# Patient Record
Sex: Female | Born: 1945 | Race: Black or African American | Hispanic: No | Marital: Married | Smoking: Never smoker
Health system: Southern US, Community
[De-identification: ages and names within clinical notes are randomized; demographics above are authoritative.]

## PROBLEM LIST (undated history)

## (undated) DIAGNOSIS — R945 Abnormal results of liver function studies: Secondary | ICD-10-CM

## (undated) DIAGNOSIS — E669 Obesity, unspecified: Secondary | ICD-10-CM

## (undated) DIAGNOSIS — Z780 Asymptomatic menopausal state: Secondary | ICD-10-CM

## (undated) DIAGNOSIS — N390 Urinary tract infection, site not specified: Secondary | ICD-10-CM

## (undated) DIAGNOSIS — R809 Proteinuria, unspecified: Secondary | ICD-10-CM

## (undated) DIAGNOSIS — I4892 Unspecified atrial flutter: Secondary | ICD-10-CM

## (undated) DIAGNOSIS — I10 Essential (primary) hypertension: Secondary | ICD-10-CM

## (undated) DIAGNOSIS — R7989 Other specified abnormal findings of blood chemistry: Secondary | ICD-10-CM

## (undated) HISTORY — DX: Obesity, unspecified: E66.9

## (undated) HISTORY — DX: Essential (primary) hypertension: I10

## (undated) HISTORY — DX: Asymptomatic menopausal state: Z78.0

## (undated) HISTORY — DX: Urinary tract infection, site not specified: N39.0

## (undated) HISTORY — DX: Abnormal results of liver function studies: R94.5

## (undated) HISTORY — PX: TUBAL LIGATION: SHX77

## (undated) HISTORY — DX: Proteinuria, unspecified: R80.9

## (undated) HISTORY — DX: Other specified abnormal findings of blood chemistry: R79.89

## (undated) HISTORY — DX: Unspecified atrial flutter: I48.92

---
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
## 1998-12-15 HISTORY — PX: CHOLECYSTECTOMY: SHX55

## 1999-06-05 ENCOUNTER — Encounter: Admission: RE | Admit: 1999-06-05 | Discharge: 1999-09-03 | Payer: Self-pay | Admitting: Family Medicine

## 1999-07-16 ENCOUNTER — Emergency Department (HOSPITAL_COMMUNITY): Admission: EM | Admit: 1999-07-16 | Discharge: 1999-07-16 | Payer: Self-pay | Admitting: *Deleted

## 1999-09-27 ENCOUNTER — Emergency Department (HOSPITAL_COMMUNITY): Admission: EM | Admit: 1999-09-27 | Discharge: 1999-09-27 | Payer: Self-pay | Admitting: Emergency Medicine

## 2000-03-26 ENCOUNTER — Encounter: Admission: RE | Admit: 2000-03-26 | Discharge: 2000-06-24 | Payer: Self-pay | Admitting: Family Medicine

## 2001-05-04 ENCOUNTER — Encounter: Payer: Self-pay | Admitting: Family Medicine

## 2001-05-04 ENCOUNTER — Encounter: Admission: RE | Admit: 2001-05-04 | Discharge: 2001-05-04 | Payer: Self-pay | Admitting: Family Medicine

## 2001-06-22 ENCOUNTER — Encounter: Payer: Self-pay | Admitting: Emergency Medicine

## 2001-06-23 ENCOUNTER — Encounter: Payer: Self-pay | Admitting: Family Medicine

## 2001-06-23 ENCOUNTER — Observation Stay (HOSPITAL_COMMUNITY): Admission: EM | Admit: 2001-06-23 | Discharge: 2001-06-23 | Payer: Self-pay | Admitting: Emergency Medicine

## 2001-06-27 ENCOUNTER — Emergency Department (HOSPITAL_COMMUNITY): Admission: EM | Admit: 2001-06-27 | Discharge: 2001-06-27 | Payer: Self-pay | Admitting: Emergency Medicine

## 2001-07-11 ENCOUNTER — Emergency Department (HOSPITAL_COMMUNITY): Admission: EM | Admit: 2001-07-11 | Discharge: 2001-07-12 | Payer: Self-pay | Admitting: Emergency Medicine

## 2001-08-09 ENCOUNTER — Emergency Department (HOSPITAL_COMMUNITY): Admission: EM | Admit: 2001-08-09 | Discharge: 2001-08-09 | Payer: Self-pay | Admitting: Emergency Medicine

## 2001-08-11 ENCOUNTER — Encounter: Payer: Self-pay | Admitting: General Surgery

## 2001-08-12 ENCOUNTER — Observation Stay (HOSPITAL_COMMUNITY): Admission: RE | Admit: 2001-08-12 | Discharge: 2001-08-13 | Payer: Self-pay | Admitting: General Surgery

## 2001-08-12 ENCOUNTER — Encounter (INDEPENDENT_AMBULATORY_CARE_PROVIDER_SITE_OTHER): Payer: Self-pay | Admitting: Specialist

## 2001-08-12 ENCOUNTER — Encounter: Payer: Self-pay | Admitting: General Surgery

## 2002-06-24 ENCOUNTER — Encounter: Payer: Self-pay | Admitting: Emergency Medicine

## 2002-06-24 ENCOUNTER — Emergency Department (HOSPITAL_COMMUNITY): Admission: EM | Admit: 2002-06-24 | Discharge: 2002-06-24 | Payer: Self-pay | Admitting: Emergency Medicine

## 2002-06-27 ENCOUNTER — Ambulatory Visit (HOSPITAL_COMMUNITY): Admission: RE | Admit: 2002-06-27 | Discharge: 2002-06-27 | Payer: Self-pay | Admitting: Gastroenterology

## 2002-10-26 ENCOUNTER — Other Ambulatory Visit: Admission: RE | Admit: 2002-10-26 | Discharge: 2002-10-26 | Payer: Self-pay | Admitting: Family Medicine

## 2002-11-15 ENCOUNTER — Encounter: Admission: RE | Admit: 2002-11-15 | Discharge: 2003-02-13 | Payer: Self-pay | Admitting: Family Medicine

## 2002-11-16 ENCOUNTER — Encounter: Payer: Self-pay | Admitting: Emergency Medicine

## 2002-11-16 ENCOUNTER — Emergency Department (HOSPITAL_COMMUNITY): Admission: EM | Admit: 2002-11-16 | Discharge: 2002-11-16 | Payer: Self-pay | Admitting: Emergency Medicine

## 2002-11-30 ENCOUNTER — Emergency Department (HOSPITAL_COMMUNITY): Admission: EM | Admit: 2002-11-30 | Discharge: 2002-11-30 | Payer: Self-pay | Admitting: Emergency Medicine

## 2002-11-30 ENCOUNTER — Encounter: Payer: Self-pay | Admitting: Emergency Medicine

## 2003-03-17 ENCOUNTER — Encounter: Admission: RE | Admit: 2003-03-17 | Discharge: 2003-06-15 | Payer: Self-pay | Admitting: Family Medicine

## 2003-10-23 ENCOUNTER — Emergency Department (HOSPITAL_COMMUNITY): Admission: EM | Admit: 2003-10-23 | Discharge: 2003-10-23 | Payer: Self-pay | Admitting: Emergency Medicine

## 2004-04-01 ENCOUNTER — Emergency Department (HOSPITAL_COMMUNITY): Admission: EM | Admit: 2004-04-01 | Discharge: 2004-04-01 | Payer: Self-pay | Admitting: Emergency Medicine

## 2005-01-30 ENCOUNTER — Emergency Department (HOSPITAL_COMMUNITY): Admission: EM | Admit: 2005-01-30 | Discharge: 2005-01-30 | Payer: Self-pay | Admitting: Emergency Medicine

## 2005-06-13 ENCOUNTER — Emergency Department (HOSPITAL_COMMUNITY): Admission: EM | Admit: 2005-06-13 | Discharge: 2005-06-14 | Payer: Self-pay | Admitting: Emergency Medicine

## 2006-04-04 ENCOUNTER — Emergency Department (HOSPITAL_COMMUNITY): Admission: EM | Admit: 2006-04-04 | Discharge: 2006-04-04 | Payer: Self-pay | Admitting: Emergency Medicine

## 2006-04-28 ENCOUNTER — Emergency Department (HOSPITAL_COMMUNITY): Admission: EM | Admit: 2006-04-28 | Discharge: 2006-04-28 | Payer: Self-pay | Admitting: *Deleted

## 2006-08-28 ENCOUNTER — Emergency Department (HOSPITAL_COMMUNITY): Admission: EM | Admit: 2006-08-28 | Discharge: 2006-08-29 | Payer: Self-pay | Admitting: Emergency Medicine

## 2006-10-29 ENCOUNTER — Emergency Department (HOSPITAL_COMMUNITY): Admission: EM | Admit: 2006-10-29 | Discharge: 2006-10-29 | Payer: Self-pay | Admitting: Emergency Medicine

## 2008-09-02 ENCOUNTER — Emergency Department (HOSPITAL_COMMUNITY): Admission: EM | Admit: 2008-09-02 | Discharge: 2008-09-02 | Payer: Self-pay | Admitting: Emergency Medicine

## 2009-12-13 ENCOUNTER — Emergency Department (HOSPITAL_COMMUNITY): Admission: EM | Admit: 2009-12-13 | Discharge: 2009-12-13 | Payer: Self-pay | Admitting: Emergency Medicine

## 2011-03-17 LAB — POCT I-STAT, CHEM 8
BUN: 6 mg/dL (ref 6–23)
Calcium, Ion: 1.15 mmol/L (ref 1.12–1.32)
Chloride: 103 mEq/L (ref 96–112)
Creatinine, Ser: 0.7 mg/dL (ref 0.4–1.2)
Glucose, Bld: 193 mg/dL — ABNORMAL HIGH (ref 70–99)
HCT: 43 % (ref 36.0–46.0)
Hemoglobin: 14.6 g/dL (ref 12.0–15.0)
Potassium: 3.6 mEq/L (ref 3.5–5.1)
Sodium: 138 mEq/L (ref 135–145)
TCO2: 25 mmol/L (ref 0–100)

## 2011-05-01 ENCOUNTER — Encounter: Payer: Self-pay | Admitting: Oncology

## 2011-05-02 NOTE — Procedures (Signed)
[facility] [place]  Patient:    Frances Cabrera, Frances Cabrera Visit Number: [id number] MRN: [id number]          Service Type: END Location: ENDO Attending Physician:  Louie Bun Dictated by:   Everardo All Madilyn Fireman, M.D. Proc. Date: 06/27/02 Admit Date:  06/27/2002   CC:         Redmond Baseman, M.D.   Procedure Report  PROCEDURE:  Esophagogastroduodenoscopy.  INDICATION FOR PROCEDURE:  Chronic reflux symptoms, suboptimally controlled with a proton pump inhibitor.  DESCRIPTION OF PROCEDURE:  The patient was placed in the left lateral decubitus position and placed on the pulse monitor with continuous low-flow oxygen delivered by nasal cannula.  She was sedated with 75 mg IV Demerol and 8 mg IV Versed.  The Olympus video endoscope was advanced under direct vision into the oropharynx and esophagus.  The esophagus was straight and of normal caliber with the squamocolumnar line at 36 cm above a 3 cm sliding hiatal hernia.  The GE junction appeared somewhat patulous, and the patient did not retain air well.  There was no visible ring, stricture, Barretts esophagus, or other abnormality of the GE junction.  The stomach was entered, and a small amount of liquid secretions were suctioned from the fundus.  Retroflexed view of the cardia confirmed a hiatal hernia and was otherwise unremarkable.  The fundus, body, antrum, and pylorus all appeared normal.  The duodenum was entered, and both the bulb and second portion were well-inspected and appeared to be within normal limits.  The scope was then withdrawn, and the patient returned to the recovery room in stable condition.  She tolerated the procedure well, and there were no immediate complications.  IMPRESSION:  A 3 cm sliding hiatal hernia with the suggestion of incompetent lower esophageal sphincter.  PLAN:  Maximize medical and lifestyle therapy for reflux including increasing dose of a proton pump inhibitor.   Will follow up in the office in a few weeks and discuss response and possibility of antireflux surgery. Dictated by:   Everardo All Madilyn Fireman, M.D. Attending Physician:  Louie Bun DD:  06/27/02 TD:  06/28/02 Job: [id number]

## 2011-05-02 NOTE — Op Note (Signed)
[facility]  Patient:    Frances Cabrera, Frances Cabrera Visit Number: [id number] MRN: [id number]          Service Type: SUR Location: 4W 0477 01 Attending Physician:  Henrene Dodge Proc. Date: 08/12/01 Admit Date:  08/12/2001   CC:         Miguel Aschoff, M.D.   Operative Report  PREOPERATIVE DIAGNOSIS:  Chronic cholecystitis  POSTOPERATIVE DIAGNOSIS:  Chronic cholecystitis.  OPERATION:  Laparoscopic cholecystectomy with cholangiogram.  SURGEON:  Anselm Pancoast. Zachery Dakins, M.D.  ASSISTANT:  Rose Phi. Maple Hudson, M.D.  ANESTHESIA:  General.  HISTORY:  Frances Cabrera is a 65 year old, moderately overweight black female, who has had several episodes of severe chest discomfort.  She has a history of hypertension and was hospitalized approximately three weeks ago with these symptoms and during the work-up, was noted to have gallstones, but it was not recommended at that time that she proceed with cholecystectomy. She has had another episode of pain and seen in the ER with pain medication and then released and then on Monday, had a similar episode and presented to the ER again.  At that time, she was seen by the ER physician, who obtained the old records and felt that these symptoms were definitely related to her gallbladder and asked me to see her.  She had been given pain medication, and when I saw her, she was not acutely in tenderness, and I recommended that we add her to the OR schedule later this week, and she was in agreement.  She has not had another severe episode of pain in the past two days.  The patient does have a history of hypertension and is on medication for this.  Her liver function studies were normal, but it had been noted previously on her ultrasound that she was described as having small stones.  DESCRIPTION OF PROCEDURE:  Preoperatively, the patient was given 400 mg of Cipro, PAS stockings, and was taken to the operative  suite.  Induction of general anesthesia and endotracheal tube, oral tube into the stomach, and then the abdomen was prepped with Betadine surgical scrub and solution and draped in a sterile manner.  The umbilical incision was through about 3 inches of fatty tissue and the fascia identified, and this was picked up between two Kochers, a small opening made, the underlying peritoneum opened into the peritoneal cavity.  Two sutures, both up and below, were placed with 0 Vicryl and then the Hasson cannula introduced.  The carbon dioxide was infused, a camera inserted, and she had a gallbladder that was lying real high, but it is very distended but not acutely inflamed.  The upper 10 mm trocar was placed after anesthetizing the fascia, and then Dr. Maple Hudson placed the two lateral 5 mm trocars.  We grasped the gallbladder and retracted it upward and then kind of carefully peeled the adhesions off around this and then freed the area of the duodenum that was sort of adherent to the proximal portion of the gallbladder. With this freed, and her up in pretty steep Trendelenburg and turned to the left, I was then able to identify the definite junction of the gallbladder and the cystic duct, and it was a smallish cystic duct.  A clip was placed flush with the neck of the gallbladder and the cystic duct junction, and then the cystic artery was identified, and this was doubly clipped proximally, singly distally, and divided.  Next, a small opening was made, and a Darrell Jewel was  introduced into the cystic duct proximal, held in place with a clamp, x-ray obtained and showed a small extrahepatic biliary system, good flow into the duodenum, and the intrahepatic radicles filled.  Next, the cystic duct was proximally clipped x 3 then divided, and then the gallbladder was freed from the bed using predominantly the hook electrocautery.  Because of her shape, size, and the distention, there was a lot of  manipulation freeing it up with good hemostasis.  I then placed the gallbladder EndoCatch bag and brought it out through the umbilicus after inspecting the bed for hemostasis and aspirating a little bit of irrigating fluid.  The gallbladder was brought up into the abdominal wound, opened.  There were numerous stones, and they were not little stones.  They were about 4-5 stones that were removed, and I could the gallbladder on through the fascial wall in the bag. I then closed the umbilical fascia with a figure-of-eight of 0 Vicryl plus the two previously-placed sutures that were tied and then reinspected with no evidence of any bile or irrigating fluid, removed the two lateral 5 mm trocars and then released the carbon dioxide and removed the upper 10 mm trocar.  The subcutaneous wounds were irrigated and then closed with 4-0 Vicryl.  Benzoin and Steri-Strips on the skin.  The patient tolerated the procedure nicely and was sent to the recovery room in stable postoperative condition.  Her blood pressure was satisfactory, and she should be able to be discharged in the a.m. Attending Physician:  Henrene Dodge DD:  08/12/01 TD:  08/12/01 Job: [id number]

## 2011-05-02 NOTE — H&P (Signed)
[facility]  Patient:    Frances Cabrera, Frances Cabrera                   MRN: [id number] Adm. Date:  [id number] Attending:  Donnetta Hutching                         History and Physical  CHIEF COMPLAINT:              Chest pain.  HISTORY:                      The patient is a 65 year old female patient of Dr. Modesto Charon with about an eight-year history of hypertension who presented to the ER this evening with a history of having left upper chest pain, on and off, began around 8 oclock, lasting about five minutes, with no associated symptoms or radiation. She had no GI symptoms. She rated it maybe 4/10. At th e time that I saw her it was 1/10 and she has not received anything for this. This is the first time she has ever had this. She has had a decreased appetite for about one week with no other specific symptoms. There is no significant family history other than her son with hypertension. She has been on Verapamil SR 240 b.i.d., clonidine 0.1 mg b.i.d., Maxzide 25 mg q.d. and a baby aspirin once a day. Her exam in the ER was unremarkable. Enzymes were negative but given her hypertension, elevated blood sugar, possible new diabetes, and the atypical nature along with some abnormal LFTs which she has had in the past for about a year now but has not had evaluated. It was felt that maybe overnight observation would be prudent, and therefore, she is being admitted to observe and do serial enzymes.  PAST MEDICAL HISTORY:         Unremarkable except for tubal ligation. She is a gravida 4, para 4, A0.  ALLERGIES:                    No known drug allergies.  FAMILY HISTORY:               As noted above.  GYNECOLOGIC HISTORY:          She has been menopausal for 12 years. She was tried on some HRT back in June of 1999 and had questionable episode of TIA that sounded like more headache than anything else. She had a negative workup including MRI and Doppler.  PHYSICAL  EXAMINATION:  VITAL SIGNS:                  Blood pressure 145/66, pulse anywhere from 90 to 105, respirations 20, afebrile.  HEENT:                        TMs and canals clear. PERRL. EOMs full, discs flat. Nose and throat clear. Dentures noted.  NECK:                         Supple, carotids 2+, no bruit noted. No adenopathy or JVD.  CHEST:                        Clear. Sinus tachycardia around 100, no murmur noted. There is tenderness in the left upper costosternal junction which she says is similar to her discomfort  she felt.  ABDOMEN:                      Normal BS, soft, some mild tenderness in her right upper quadrant without rebound, mass, or organomegaly but difficult to assess secondary to her size.  EXTREMITIES:                  Pulses intact, no edema, no evidence of DVT.  EKG:                          Nonspecific changes, questionable left atrial enlargement.  O2 saturation was 98% on room air.  CHEST X-RAY:                  Unremarkable.  LABORATORY DATA:              Enzymes were negative. Lipase normal. She identified have elevation in the sugar of 168. OT 48, PT 51. CBC normal.  ASSESSMENT:                   1. Chest pain, doubt cardiac.                               2. Abnormal LFTs with minimal abdominal                                  symptoms, rule out gallbladder disease.                               3. Elevated blood sugar, rule out diabetes.  PLAN:                         Serial enzymes. Abdominal ultrasound. Recheck fasting labs. Hospital outpatient treadmill. DD:  06/23/01 TD:  06/23/01 Job: [id number]

## 2011-09-15 LAB — POCT CARDIAC MARKERS
CKMB, poc: 1.4
Myoglobin, poc: 89.3
Troponin i, poc: <0.05

## 2012-05-30 ENCOUNTER — Ambulatory Visit: Payer: 59 | Admitting: Physician Assistant

## 2012-05-30 VITALS — BP 178/68 | HR 114 | Temp 98.2°F | Resp 18 | Ht 69.5 in | Wt 268.6 lb

## 2012-05-30 DIAGNOSIS — E1169 Type 2 diabetes mellitus with other specified complication: Secondary | ICD-10-CM | POA: Insufficient documentation

## 2012-05-30 DIAGNOSIS — Z6835 Body mass index (BMI) 35.0-35.9, adult: Secondary | ICD-10-CM | POA: Insufficient documentation

## 2012-05-30 DIAGNOSIS — E119 Type 2 diabetes mellitus without complications: Secondary | ICD-10-CM

## 2012-05-30 DIAGNOSIS — I10 Essential (primary) hypertension: Secondary | ICD-10-CM

## 2012-05-30 DIAGNOSIS — N390 Urinary tract infection, site not specified: Secondary | ICD-10-CM | POA: Insufficient documentation

## 2012-05-30 DIAGNOSIS — R809 Proteinuria, unspecified: Secondary | ICD-10-CM | POA: Insufficient documentation

## 2012-05-30 LAB — POCT URINALYSIS DIPSTICK
Bilirubin, UA: NEGATIVE
Glucose, UA: NEGATIVE
Ketones, UA: NEGATIVE
Nitrite, UA: NEGATIVE
Spec Grav, UA: 1.025
Urobilinogen, UA: 0.2
pH, UA: 5.5

## 2012-05-30 LAB — POCT UA - MICROSCOPIC ONLY
Casts, Ur, LPF, POC: NEGATIVE
Crystals, Ur, HPF, POC: NEGATIVE
Mucus, UA: NEGATIVE
Yeast, UA: NEGATIVE

## 2012-05-30 MED ORDER — VERAPAMIL HCL 240 MG (CO) PO TB24: 240.0000 mg | ORAL_TABLET | Freq: Every day | ORAL | Status: DC

## 2012-05-30 MED ORDER — CIPROFLOXACIN HCL 500 MG PO TABS: 500.0000 mg | ORAL_TABLET | Freq: Two times a day (BID) | ORAL | Status: AC

## 2012-05-30 MED ORDER — FLUCONAZOLE 150 MG PO TABS: 150.0000 mg | ORAL_TABLET | Freq: Once | ORAL | Status: AC

## 2012-05-30 NOTE — Progress Notes (Signed)
  Subjective:    Patient ID: Frances Cabrera, female    DOB: [birth year], 66 y.o.   MRN: [id number]  HPI  Presents with 4 days of urinary urgency, frequency and burning.  Also vaginal itching.  Symptoms began after intercourse when she returned from an extended trip to see her mother in [place].  She has a history of post-coital UTI.  She also has DM type 2, HTN, obesity and a history of elevated LFTs.  She has historically had compliance issues, primarily related to finances.  She intends to see me for follow-up of her chronic issues next month, with fasting labs.  Home BP diary reveals readings 103-134/52-79.  Review of Systems No chest pain, SOB, HA, dizziness, vision change, N/V, diarrhea, constipation, myalgias, arthralgias or rash.     Objective:   Physical Exam  Vital signs noted. Well-developed, well nourished BF who is awake, alert and oriented, in NAD. HEENT: [place]/AT, sclera and conjunctiva are clear.   Neck: supple, non-tender, no lymphadenopathy, thyromegaly. Heart: RRR, no murmur Lungs: CTA Skin: warm and dry without rash.  Results for orders placed in visit on 05/30/12  POCT UA - MICROSCOPIC ONLY      Component Value Range   WBC, Ur, HPF, POC TNTC     RBC, urine, microscopic 3-5     Bacteria, U Microscopic 3+     Mucus, UA neg     Epithelial cells, urine per micros 2-3     Crystals, Ur, HPF, POC neg     Casts, Ur, LPF, POC neg     Yeast, UA neg    POCT URINALYSIS DIPSTICK      Component Value Range   Color, UA yellow     Clarity, UA turbid     Glucose, UA neg     Bilirubin, UA neg     Ketones, UA neg     Spec Grav, UA 1.025     Blood, UA small     pH, UA 5.5     Protein, UA trace     Urobilinogen, UA 0.2     Nitrite, UA neg     Leukocytes, UA moderate (2+)         Assessment & Plan:   1. UTI (urinary tract infection)  POCT UA - Microscopic Only, POCT urinalysis dipstick, ciprofloxacin (CIPRO) 500 MG tablet, fluconazole (DIFLUCAN) 150 MG tablet   2. HTN (hypertension)  verapamil (COVERA HS) 240 MG (CO) 24 hr tablet   Re-evaluate by appointment on 07/08/2012.  Fasting labs at that time.

## 2012-05-30 NOTE — Patient Instructions (Signed)
If you have not heard about your follow-up appointment with me in 1 week, please call the office.

## 2012-06-01 ENCOUNTER — Telehealth: Payer: Self-pay

## 2012-06-01 NOTE — Progress Notes (Signed)
Follow up appt made for 7/25.

## 2012-06-01 NOTE — Telephone Encounter (Signed)
PATIENT SAW CHELLE ON Sunday.  SHE HAS UTI NOW AND WANTS TO KNOW IF WE CAN CALL HER IN SOMETHING

## 2012-06-01 NOTE — Telephone Encounter (Signed)
Spoke with patient, she is about half way through her meds for UTI, but still having discomfort.  Is taking Tylenol 1000mg , and still uncomfortable.  Can we rx anything stronger to help?

## 2012-06-02 MED ORDER — PHENAZOPYRIDINE HCL 200 MG PO TABS: 200.0000 mg | ORAL_TABLET | Freq: Three times a day (TID) | ORAL | Status: AC | PRN

## 2012-06-02 NOTE — Telephone Encounter (Signed)
I can call in some Pyridium to help with the discomfort. If it persists may need to RTC for further testing.

## 2012-06-02 NOTE — Telephone Encounter (Signed)
Pyridium sent to the pharmacy

## 2012-06-02 NOTE — Telephone Encounter (Signed)
LMOM to CB. 

## 2012-06-02 NOTE — Telephone Encounter (Signed)
Pt would like to try the pyridium

## 2012-06-15 ENCOUNTER — Encounter: Payer: Self-pay | Admitting: Family Medicine

## 2012-06-17 ENCOUNTER — Other Ambulatory Visit: Payer: Self-pay | Admitting: Physician Assistant

## 2012-07-02 ENCOUNTER — Telehealth: Payer: Self-pay

## 2012-07-08 ENCOUNTER — Ambulatory Visit: Payer: 59 | Admitting: Physician Assistant

## 2012-07-10 ENCOUNTER — Other Ambulatory Visit: Payer: Self-pay | Admitting: *Deleted

## 2012-08-10 ENCOUNTER — Encounter: Payer: Self-pay | Admitting: Physician Assistant

## 2012-08-10 ENCOUNTER — Ambulatory Visit (INDEPENDENT_AMBULATORY_CARE_PROVIDER_SITE_OTHER): Payer: 59 | Admitting: Physician Assistant

## 2012-08-10 VITALS — BP 182/86 | HR 114 | Temp 98.9°F | Resp 18 | Ht 69.0 in | Wt 272.0 lb

## 2012-08-10 DIAGNOSIS — I10 Essential (primary) hypertension: Secondary | ICD-10-CM

## 2012-08-10 DIAGNOSIS — R05 Cough: Secondary | ICD-10-CM

## 2012-08-10 DIAGNOSIS — R809 Proteinuria, unspecified: Secondary | ICD-10-CM

## 2012-08-10 DIAGNOSIS — Z23 Encounter for immunization: Secondary | ICD-10-CM

## 2012-08-10 DIAGNOSIS — N390 Urinary tract infection, site not specified: Secondary | ICD-10-CM

## 2012-08-10 DIAGNOSIS — E119 Type 2 diabetes mellitus without complications: Secondary | ICD-10-CM

## 2012-08-10 DIAGNOSIS — E669 Obesity, unspecified: Secondary | ICD-10-CM

## 2012-08-10 DIAGNOSIS — R059 Cough, unspecified: Secondary | ICD-10-CM

## 2012-08-10 LAB — COMPREHENSIVE METABOLIC PANEL
ALT: 63 U/L — ABNORMAL HIGH (ref 0–35)
AST: 64 U/L — ABNORMAL HIGH (ref 0–37)
Albumin: 4.5 g/dL (ref 3.5–5.2)
Alkaline Phosphatase: 48 U/L (ref 39–117)
BUN: 12 mg/dL (ref 6–23)
CO2: 22 mEq/L (ref 19–32)
Calcium: 10.2 mg/dL (ref 8.4–10.5)
Chloride: 101 mEq/L (ref 96–112)
Creat: 0.83 mg/dL (ref 0.50–1.10)
Glucose, Bld: 159 mg/dL — ABNORMAL HIGH (ref 70–99)
Potassium: 4.2 mEq/L (ref 3.5–5.3)
Sodium: 137 mEq/L (ref 135–145)
Total Bilirubin: 0.5 mg/dL (ref 0.3–1.2)
Total Protein: 8.1 g/dL (ref 6.0–8.3)

## 2012-08-10 LAB — LIPID PANEL
Cholesterol: 211 mg/dL — ABNORMAL HIGH (ref 0–200)
HDL: 53 mg/dL (ref >39)
LDL Cholesterol: 131 mg/dL — ABNORMAL HIGH (ref 0–99)
Total CHOL/HDL Ratio: 4 Ratio
Triglycerides: 133 mg/dL (ref <150)
VLDL: 27 mg/dL (ref 0–40)

## 2012-08-10 LAB — POCT GLYCOSYLATED HEMOGLOBIN (HGB A1C): Hemoglobin A1C: 6.9

## 2012-08-10 LAB — GLUCOSE, POCT (MANUAL RESULT ENTRY): POC Glucose: 162 mg/dl — AB (ref 70–99)

## 2012-08-10 MED ORDER — CIPROFLOXACIN HCL 500 MG PO TABS: ORAL_TABLET | ORAL | Status: DC

## 2012-08-10 MED ORDER — VERAPAMIL HCL 240 MG (CO) PO TB24: 240.0000 mg | ORAL_TABLET | Freq: Two times a day (BID) | ORAL | Status: DC

## 2012-08-10 MED ORDER — METFORMIN HCL 1000 MG PO TABS: 1000.0000 mg | ORAL_TABLET | Freq: Two times a day (BID) | ORAL | Status: DC

## 2012-08-10 MED ORDER — BENZONATATE 100 MG PO CAPS: 100.0000 mg | ORAL_CAPSULE | Freq: Three times a day (TID) | ORAL | Status: AC | PRN

## 2012-08-10 MED ORDER — LISINOPRIL 10 MG PO TABS: 10.0000 mg | ORAL_TABLET | Freq: Every day | ORAL | Status: DC

## 2012-08-10 NOTE — Patient Instructions (Signed)
Keep up the good work. Write down your blood pressure when you take it, and bring the log in at your next visit for me to review.

## 2012-08-10 NOTE — Progress Notes (Signed)
  Subjective:    Patient ID: Frances Cabrera, female    DOB: 03-06-1946, 66 y.o.   MRN: [id number]  HPI This 66 y.o. female presents for evaluation of diabetes, htn.  "I hate coming out here.  I've been stressing out about having to come in here since yesterday."  Recent URI with cough.  Self treated with OTC products, but cough persists.  Has not had sex since her last visit due to fears of post-coital cystitis. She reports that her BP is better at home than here, and she hasn't taken her dose today, and she's been taking the verapamil BID instead of QD.    Review of Systems Denies chest pain, shortness of breath, HA, dizziness, vision change, nausea, vomiting, diarrhea, constipation, melena, hematochezia, dysuria, increased urinary urgency or frequency, increased hunger or thirst, unintentional weight change, unexplained myalgias or arthralgias, rash.  Checks home glucose daily. Glucose (usually fasting) ranges from 98 to165. does experience symptoms with hypoglycemia. Feels a little bit anxious, "shivery." does perform daily foot exam. Last dental visit was a long time ago.  She has compensated endentulae. Last eye exam was 2 years ago. has not received a pneumococcal vaccine. is current with influenza vaccination.  Past Medical History  Diagnosis Date  . Hypertension   . Diabetes mellitus   . Microalbuminuria   . Recurrent UTI     post-coital  . Obesity   . Elevated LFTs   . Menopause     Family History  Problem Relation Age of Onset  . Diabetes Mother   . Hypertension Mother   . Diabetes Father        Objective:   Physical Exam  Blood pressure 182/86, pulse 114, temperature 98.9 F (37.2 C), temperature source Oral, resp. rate 18, height 5\' 9"  (1.753 m), weight 272 lb (123.378 kg), SpO2 98.00%. Body mass index is 40.17 kg/(m^2). Well-developed, well nourished BF who is awake, alert and oriented, in NAD. HEENT: [place]/AT, PERRL, EOMI.  Sclera and conjunctiva are  clear.  EAC are patent, TMs are normal in appearance. Nasal mucosa is pink and moist. OP is clear. Neck: supple, non-tender, no lymphadenopathy, thyromegaly. Heart: RRR, no murmur Lungs: CTA Abdomen: normo-active bowel sounds, supple, non-tender, no mass or organomegaly. Extremities: no cyanosis, clubbing or edema. Skin: warm and dry without rash. See diabetic foot exam.  Results for orders placed in visit on 08/10/12  GLUCOSE, POCT (MANUAL RESULT ENTRY)      Component Value Range   POC Glucose 162 (*) 70 - 99 mg/dl  POCT GLYCOSYLATED HEMOGLOBIN (HGB A1C)      Component Value Range   Hemoglobin A1C 6.9        Assessment & Plan:

## 2012-08-11 ENCOUNTER — Encounter: Payer: Self-pay | Admitting: Physician Assistant

## 2012-08-11 ENCOUNTER — Telehealth: Payer: Self-pay

## 2012-08-11 LAB — MICROALBUMIN, URINE: Microalb, Ur: 5.11 mg/dL — ABNORMAL HIGH (ref 0.00–1.89)

## 2012-08-11 MED ORDER — COLESEVELAM HCL 625 MG PO TABS: 1875.0000 mg | ORAL_TABLET | Freq: Two times a day (BID) | ORAL | Status: DC

## 2012-08-11 NOTE — Assessment & Plan Note (Signed)
Cipro 500 mg once after intercourse, and 1 BID x 5 days prn urinary symptoms.

## 2012-08-11 NOTE — Addendum Note (Signed)
Addended by: Fernande Bras on: 08/11/2012 11:15 AM   Modules accepted: Orders

## 2012-08-11 NOTE — Telephone Encounter (Signed)
Pt states she was in office for cough and states the medicine she prescribed her is making her drowsy and if she could call her some cough syrup in, also was given cipro but she always give her a pill for yeast infection and could she also call in rx for yeast infection. [phone]

## 2012-08-11 NOTE — Assessment & Plan Note (Signed)
She reports her BP is well controlled at home.  She'll continue her current treatment, with efforts for weight loss through healthy eating and regular exercise, and bring in a diary of her BP readings for my review in 3 months, sooner if readings are consistently >140/90.

## 2012-08-11 NOTE — Assessment & Plan Note (Signed)
Improvement in her weight will benefit both her HTN and DM.  Stressed the importance of weight loss to improve her overall health and reduce risk of cardiovascular events and renal failure.

## 2012-08-11 NOTE — Assessment & Plan Note (Signed)
Much improved, better than I expected!  Continue efforts for healthy eating, regular exercise, and weight loss.  RTC 3 months.

## 2012-08-11 NOTE — Assessment & Plan Note (Signed)
Expect improvement if her BP really is normal at home.  Stressed the importance of BP and glucose control to improve renal function and prevent deterioration.

## 2012-08-12 MED ORDER — FLUCONAZOLE 150 MG PO TABS: 150.0000 mg | ORAL_TABLET | Freq: Once | ORAL | Status: AC

## 2012-08-12 NOTE — Telephone Encounter (Signed)
Can we rx a cough syrup for patient, along with a Diflucan rx to use if needed?

## 2012-08-12 NOTE — Telephone Encounter (Signed)
She was given tessalon perles, which should not make her drowsy. I am not sure what else to tell her other than OTC Delsym.  Rx for Diflucan sent to pharmacy

## 2012-08-12 NOTE — Telephone Encounter (Signed)
Notified pt that Diflucan was sent in and D/W her using Delsym for cough. Pt agreed to try.

## 2012-08-17 ENCOUNTER — Telehealth: Payer: Self-pay

## 2012-08-17 DIAGNOSIS — E7800 Pure hypercholesterolemia, unspecified: Secondary | ICD-10-CM

## 2012-08-17 DIAGNOSIS — I10 Essential (primary) hypertension: Secondary | ICD-10-CM

## 2012-08-17 DIAGNOSIS — E119 Type 2 diabetes mellitus without complications: Secondary | ICD-10-CM

## 2012-08-17 MED ORDER — METFORMIN HCL 1000 MG PO TABS: 1000.0000 mg | ORAL_TABLET | Freq: Two times a day (BID) | ORAL | Status: DC

## 2012-08-17 MED ORDER — CLONIDINE HCL 0.2 MG PO TABS: 0.2000 mg | ORAL_TABLET | Freq: Every day | ORAL | Status: DC

## 2012-08-17 MED ORDER — COLESEVELAM HCL 625 MG PO TABS: 1875.0000 mg | ORAL_TABLET | Freq: Two times a day (BID) | ORAL | Status: DC

## 2012-08-17 MED ORDER — VERAPAMIL HCL 240 MG (CO) PO TB24: 240.0000 mg | ORAL_TABLET | Freq: Two times a day (BID) | ORAL | Status: DC

## 2012-08-17 MED ORDER — TRIAMTERENE-HCTZ 37.5-25 MG PO CAPS
1.0000 | ORAL_CAPSULE | Freq: Every day | ORAL | Status: DC
Start: 2012-08-17 — End: 2012-11-22

## 2012-08-17 MED ORDER — LISINOPRIL 10 MG PO TABS: 10.0000 mg | ORAL_TABLET | Freq: Every day | ORAL | Status: DC

## 2012-08-17 NOTE — Telephone Encounter (Signed)
  Current Outpatient Prescriptions on File Prior to Visit  Medication Sig Dispense Refill  . aspirin 81 MG tablet Take 81 mg by mouth daily.      . benzonatate (TESSALON) 100 MG capsule Take 1-2 capsules (100-200 mg total) by mouth 3 (three) times daily as needed for cough.  40 capsule  0  . ciprofloxacin (CIPRO) 500 MG tablet Take 1 PO after sex.  If symptoms develop, take 1 PO BID x 5 days.  30 tablet  0  . cloNIDine (CATAPRES) 0.2 MG tablet TAKE 1 TABLET DAILY  90 tablet  0  . colesevelam (WELCHOL) 625 MG tablet Take 3 tablets (1,875 mg total) by mouth 2 (two) times daily with a meal.  180 tablet  5  . lisinopril (PRINIVIL,ZESTRIL) 10 MG tablet Take 1 tablet (10 mg total) by mouth daily.  30 tablet  1  . metFORMIN (GLUCOPHAGE) 1000 MG tablet Take 1 tablet (1,000 mg total) by mouth 2 (two) times daily with a meal.  60 tablet  5  . Multiple Vitamins-Minerals (MULTIVITAMIN WITH MINERALS) tablet Take 1 tablet by mouth daily.      . pravastatin (PRAVACHOL) 40 MG tablet Take 40 mg by mouth daily.      . sitaGLIPtin (JANUVIA) 50 MG tablet Take 50 mg by mouth daily.      Marland Kitchen triamterene-hydrochlorothiazide (DYAZIDE) 37.5-25 MG per capsule TAKE 1 CAPSULE DAILY  90 capsule  0  . verapamil (CALAN-SR) 240 MG CR tablet TAKE 1 TABLET TWICE A DAY  180 tablet  0  . verapamil (COVERA HS) 240 MG (CO) 24 hr tablet Take 1 tablet (240 mg total) by mouth 2 (two) times daily.  60 tablet  5  patient needs refills on meds sent to [facility], instead of [facility]   States she needs all her meds this is the list. Please advise if okay to send.

## 2012-08-17 NOTE — Telephone Encounter (Signed)
LMOM RX sent 

## 2012-08-17 NOTE — Telephone Encounter (Signed)
Ok, sent to [facility]

## 2012-08-17 NOTE — Telephone Encounter (Signed)
I see multiple medications that Chelle has prescribed, please get more info so I can know what it was for.

## 2012-08-17 NOTE — Telephone Encounter (Signed)
After lengthy discussion with patient, she has clarified (hopefully) she needs the following :Clonidine 0.2mg , Triamterine- HCTZ 37.5 25, Metformin 1000mg  bid, Verapamil 240mg   24hr tablet bid, Lisinopril 10mg  would like 3 month supply sent to [facility]

## 2012-08-17 NOTE — Telephone Encounter (Signed)
PT STATES CHELLE HAD CALLED HER IN SOME MEDICINE AND SHE DOESN'T KNOW WHAT IT WAS FOR AND WOULD LIKE TO KNOW. ALSO WOULD LIKE TO HAVE [place] CALL [facility] FOR HER Adam Phenix CALL PT AT [phone]    [facility]

## 2012-08-18 ENCOUNTER — Other Ambulatory Visit: Payer: Self-pay | Admitting: Family Medicine

## 2012-08-18 MED ORDER — VERAPAMIL HCL ER 240 MG PO TBCR: 240.0000 mg | EXTENDED_RELEASE_TABLET | Freq: Two times a day (BID) | ORAL | Status: DC

## 2012-11-22 ENCOUNTER — Telehealth: Payer: Self-pay

## 2012-11-22 DIAGNOSIS — I10 Essential (primary) hypertension: Secondary | ICD-10-CM

## 2012-11-22 DIAGNOSIS — E119 Type 2 diabetes mellitus without complications: Secondary | ICD-10-CM

## 2012-11-22 MED ORDER — TRIAMTERENE-HCTZ 37.5-25 MG PO CAPS: 1.0000 | ORAL_CAPSULE | Freq: Every day | ORAL | Status: DC

## 2012-11-22 MED ORDER — CLONIDINE HCL 0.2 MG PO TABS: 0.2000 mg | ORAL_TABLET | Freq: Every day | ORAL | Status: DC

## 2012-11-22 MED ORDER — METFORMIN HCL 1000 MG PO TABS: 1000.0000 mg | ORAL_TABLET | Freq: Two times a day (BID) | ORAL | Status: DC

## 2012-11-22 MED ORDER — VERAPAMIL HCL ER 240 MG PO TBCR: 240.0000 mg | EXTENDED_RELEASE_TABLET | Freq: Two times a day (BID) | ORAL | Status: DC

## 2012-11-22 NOTE — Telephone Encounter (Signed)
Signed meds for 30-day supplies as requested.

## 2012-11-22 NOTE — Telephone Encounter (Signed)
Frances Cabrera, pt states that she lost her insurance and she needs another month of medications if possible to save up the money to RTC. You had wanted to see her back in 3 mos from her 08/10/12 OV. Pt requests one month supply of Metformin, Clinidine, Triamterene, and verapamil. I verified strengths/dosages w/pt and have pended them. Do you want to OK the RFs?

## 2012-11-22 NOTE — Telephone Encounter (Signed)
CHELLE - PT NEEDS A REFILL ON HER MEDICATION.  SAYS SHE NO LONGER HAS INSURANCE AND WANTS TO KNOW IF WE COULD FILL THE MEDICATION FOR JUST ONE MONTH, UNTIL SHE CAN GET THE MONEY TO COME IN AND BE SEEN AGAIN.  [phone]

## 2012-11-22 NOTE — Telephone Encounter (Signed)
What medication is she requesting? I have left message so she will call back to advise.

## 2012-11-23 NOTE — Telephone Encounter (Signed)
I have advised her.  

## 2013-01-17 ENCOUNTER — Other Ambulatory Visit: Payer: Self-pay | Admitting: Physician Assistant

## 2013-01-17 NOTE — Telephone Encounter (Signed)
Pt due for follow up in November 2013, needs visit and labs

## 2013-02-10 ENCOUNTER — Other Ambulatory Visit: Payer: Self-pay | Admitting: Radiology

## 2013-02-10 DIAGNOSIS — I10 Essential (primary) hypertension: Secondary | ICD-10-CM

## 2013-02-10 DIAGNOSIS — E119 Type 2 diabetes mellitus without complications: Secondary | ICD-10-CM

## 2013-02-10 NOTE — Telephone Encounter (Signed)
Patient aware she is overdue for follow up her insurance, medicare will take effect in April she can not afford visit, she needs meds until then, please advise pended the ones she needs. Amy

## 2013-02-11 MED ORDER — LISINOPRIL 10 MG PO TABS: 10.0000 mg | ORAL_TABLET | Freq: Every day | ORAL | Status: DC

## 2013-02-11 MED ORDER — METFORMIN HCL 1000 MG PO TABS: 1000.0000 mg | ORAL_TABLET | Freq: Two times a day (BID) | ORAL | Status: DC

## 2013-02-11 MED ORDER — CLONIDINE HCL 0.2 MG PO TABS: 0.2000 mg | ORAL_TABLET | Freq: Every day | ORAL | Status: DC

## 2013-02-11 MED ORDER — TRIAMTERENE-HCTZ 37.5-25 MG PO CAPS: 1.0000 | ORAL_CAPSULE | Freq: Every day | ORAL | Status: DC

## 2013-03-11 ENCOUNTER — Emergency Department (HOSPITAL_COMMUNITY)
Admission: EM | Admit: 2013-03-11 | Discharge: 2013-03-11 | Disposition: A | Discharge status: Home / Self Care | Payer: Self-pay | Attending: Emergency Medicine | Admitting: Emergency Medicine

## 2013-03-11 ENCOUNTER — Encounter (HOSPITAL_COMMUNITY): Payer: Self-pay | Admitting: Emergency Medicine

## 2013-03-11 DIAGNOSIS — Z862 Personal history of diseases of the blood and blood-forming organs and certain disorders involving the immune mechanism: Secondary | ICD-10-CM | POA: Insufficient documentation

## 2013-03-11 DIAGNOSIS — Z8639 Personal history of other endocrine, nutritional and metabolic disease: Secondary | ICD-10-CM | POA: Insufficient documentation

## 2013-03-11 DIAGNOSIS — R112 Nausea with vomiting, unspecified: Secondary | ICD-10-CM | POA: Insufficient documentation

## 2013-03-11 DIAGNOSIS — E119 Type 2 diabetes mellitus without complications: Secondary | ICD-10-CM | POA: Insufficient documentation

## 2013-03-11 DIAGNOSIS — Z8742 Personal history of other diseases of the female genital tract: Secondary | ICD-10-CM | POA: Insufficient documentation

## 2013-03-11 DIAGNOSIS — K5289 Other specified noninfective gastroenteritis and colitis: Secondary | ICD-10-CM | POA: Insufficient documentation

## 2013-03-11 DIAGNOSIS — K625 Hemorrhage of anus and rectum: Secondary | ICD-10-CM

## 2013-03-11 DIAGNOSIS — E669 Obesity, unspecified: Secondary | ICD-10-CM | POA: Insufficient documentation

## 2013-03-11 DIAGNOSIS — Z9089 Acquired absence of other organs: Secondary | ICD-10-CM | POA: Insufficient documentation

## 2013-03-11 DIAGNOSIS — I10 Essential (primary) hypertension: Secondary | ICD-10-CM | POA: Insufficient documentation

## 2013-03-11 DIAGNOSIS — Z79899 Other long term (current) drug therapy: Secondary | ICD-10-CM | POA: Insufficient documentation

## 2013-03-11 DIAGNOSIS — Z87440 Personal history of urinary (tract) infections: Secondary | ICD-10-CM | POA: Insufficient documentation

## 2013-03-11 DIAGNOSIS — Z9851 Tubal ligation status: Secondary | ICD-10-CM | POA: Insufficient documentation

## 2013-03-11 DIAGNOSIS — K529 Noninfective gastroenteritis and colitis, unspecified: Secondary | ICD-10-CM

## 2013-03-11 DIAGNOSIS — R197 Diarrhea, unspecified: Secondary | ICD-10-CM | POA: Insufficient documentation

## 2013-03-11 DIAGNOSIS — K921 Melena: Secondary | ICD-10-CM | POA: Insufficient documentation

## 2013-03-11 DIAGNOSIS — R1013 Epigastric pain: Secondary | ICD-10-CM | POA: Insufficient documentation

## 2013-03-11 DIAGNOSIS — K649 Unspecified hemorrhoids: Secondary | ICD-10-CM | POA: Insufficient documentation

## 2013-03-11 LAB — COMPREHENSIVE METABOLIC PANEL
ALT: 83 U/L — ABNORMAL HIGH (ref 0–35)
AST: 86 U/L — ABNORMAL HIGH (ref 0–37)
Albumin: 3.8 g/dL (ref 3.5–5.2)
Alkaline Phosphatase: 41 U/L (ref 39–117)
BUN: 20 mg/dL (ref 6–23)
CO2: 21 mEq/L (ref 19–32)
Calcium: 9.1 mg/dL (ref 8.4–10.5)
Chloride: 93 mEq/L — ABNORMAL LOW (ref 96–112)
Creatinine, Ser: 2.2 mg/dL — ABNORMAL HIGH (ref 0.50–1.10)
GFR calc Af Amer: 26 mL/min — ABNORMAL LOW (ref >90)
GFR calc non Af Amer: 22 mL/min — ABNORMAL LOW (ref >90)
Glucose, Bld: 161 mg/dL — ABNORMAL HIGH (ref 70–99)
Potassium: 3.5 mEq/L (ref 3.5–5.1)
Sodium: 131 mEq/L — ABNORMAL LOW (ref 135–145)
Total Bilirubin: 0.3 mg/dL (ref 0.3–1.2)
Total Protein: 8 g/dL (ref 6.0–8.3)

## 2013-03-11 LAB — CBC WITH DIFFERENTIAL/PLATELET
Basophils Absolute: 0 10*3/uL (ref 0.0–0.1)
Basophils Relative: 1 % (ref 0–1)
Eosinophils Absolute: 0 10*3/uL (ref 0.0–0.7)
Eosinophils Relative: 0 % (ref 0–5)
HCT: 35.1 % — ABNORMAL LOW (ref 36.0–46.0)
Hemoglobin: 11.8 g/dL — ABNORMAL LOW (ref 12.0–15.0)
Lymphocytes Relative: 41 % (ref 12–46)
Lymphs Abs: 1.7 10*3/uL (ref 0.7–4.0)
MCH: 24.9 pg — ABNORMAL LOW (ref 26.0–34.0)
MCHC: 33.6 g/dL (ref 30.0–36.0)
MCV: 74.1 fL — ABNORMAL LOW (ref 78.0–100.0)
Monocytes Absolute: 0.7 10*3/uL (ref 0.1–1.0)
Monocytes Relative: 16 % — ABNORMAL HIGH (ref 3–12)
Neutro Abs: 1.8 10*3/uL (ref 1.7–7.7)
Neutrophils Relative %: 42 % — ABNORMAL LOW (ref 43–77)
Platelets: 244 10*3/uL (ref 150–400)
RBC: 4.74 MIL/uL (ref 3.87–5.11)
RDW: 15.1 % (ref 11.5–15.5)
WBC: 4.2 10*3/uL (ref 4.0–10.5)

## 2013-03-11 LAB — GLUCOSE, CAPILLARY
Glucose-Capillary: 156 mg/dL — ABNORMAL HIGH (ref 70–99)
Glucose-Capillary: 150 mg/dL — ABNORMAL HIGH (ref 70–99)

## 2013-03-11 MED ORDER — ONDANSETRON HCL 4 MG/2ML IJ SOLN
4.0000 mg | Freq: Once | INTRAMUSCULAR | Status: AC
  Administered 2013-03-11: 4 mg via INTRAVENOUS
  Filled 2013-03-11: qty 2

## 2013-03-11 MED ORDER — ONDANSETRON HCL 8 MG PO TABS: 8.0000 mg | ORAL_TABLET | Freq: Three times a day (TID) | ORAL | Status: DC | PRN

## 2013-03-11 MED ORDER — SODIUM CHLORIDE 0.9 % IV BOLUS (SEPSIS)
500.0000 mL | Freq: Once | INTRAVENOUS | Status: AC
  Administered 2013-03-11: 500 mL via INTRAVENOUS

## 2013-03-11 MED ORDER — SODIUM CHLORIDE 0.9 % IV SOLN: INTRAVENOUS | Status: DC

## 2013-03-11 NOTE — ED Provider Notes (Signed)
History     CSN: [id number]  Arrival date & time 03/11/13  4098   First MD Initiated Contact with Patient 03/11/13 [phone]      Chief Complaint  Patient presents with  . Rectal Bleeding    (Consider location/radiation/quality/duration/timing/severity/associated sxs/prior treatment) HPI Comments: Frances Cabrera is a 67 y.o. female who presents for evaluation of nausea, vomiting, and diarrhea. She's not been able to eat or drink because it causes more nausea. She has had a thin, brown colored stool with blood noted on the tissue when she wipes. She has occasional abdominal cramps. These are not persistent. She feels weak when standing. Prior to my seeing the patient, in the emergency department. She had a syncopal episode. I saw her shortly after that. She was alert, cooperative, and able to give history. She has not had this problem previously. There are no other known modifying factors.  Patient is a 67 y.o. female presenting with hematochezia. The history is provided by the patient and a relative.  Rectal Bleeding     Past Medical History  Diagnosis Date  . Hypertension   . Diabetes mellitus   . Microalbuminuria   . Recurrent UTI     post-coital  . Obesity   . Elevated LFTs   . Menopause     Past Surgical History  Procedure Laterality Date  . Tubal ligation    . Cholecystectomy  2000    Family History  Problem Relation Age of Onset  . Diabetes Mother   . Hypertension Mother   . Diabetes Father     History  Substance Use Topics  . Smoking status: Never Smoker   . Smokeless tobacco: Not on file  . Alcohol Use: No    OB History   Grav Para Term Preterm Abortions TAB SAB Ect Mult Living                  Review of Systems  Gastrointestinal: Positive for hematochezia.  All other systems reviewed and are negative.    Allergies  Penicillins  Home Medications   Current Outpatient Rx  Name  Route  Sig  Dispense  Refill  . ciprofloxacin (CIPRO) 500  MG tablet      Take 1 PO after sex.  If symptoms develop, take 1 PO BID x 5 days.   30 tablet   0   . cloNIDine (CATAPRES) 0.2 MG tablet   Oral   Take 1 tablet (0.2 mg total) by mouth daily.   30 tablet   1   . lisinopril (PRINIVIL,ZESTRIL) 10 MG tablet   Oral   Take 1 tablet (10 mg total) by mouth daily.   30 tablet   1   . metFORMIN (GLUCOPHAGE) 1000 MG tablet   Oral   Take 1 tablet (1,000 mg total) by mouth 2 (two) times daily with a meal.   60 tablet   1   . Multiple Vitamins-Minerals (MULTIVITAMIN WITH MINERALS) tablet   Oral   Take 1 tablet by mouth at bedtime.          . triamterene-hydrochlorothiazide (DYAZIDE) 37.5-25 MG per capsule   Oral   Take 1 each (1 capsule total) by mouth daily. NEED VISIT/LABS!   30 capsule   1     Pt due for OV and labs   . verapamil (CALAN-SR) 240 MG CR tablet   Oral   Take 1 tablet (240 mg total) by mouth 2 (two) times daily.   60 tablet  0   . ondansetron (ZOFRAN) 8 MG tablet   Oral   Take 1 tablet (8 mg total) by mouth every 8 (eight) hours as needed for nausea.   20 tablet   0     BP 179/78  Pulse 118  Temp(Src) 98.9 F (37.2 C) (Oral)  Resp 18  SpO2 100%  Physical Exam  Nursing note and vitals reviewed. Constitutional: She is oriented to person, place, and time. She appears well-developed.  Obese  HENT:  Head: Normocephalic and atraumatic.  Eyes: Conjunctivae and EOM are normal. Pupils are equal, round, and reactive to light.  Neck: Normal range of motion and phonation normal. Neck supple.  Cardiovascular: Normal rate, regular rhythm and intact distal pulses.   Pulmonary/Chest: Effort normal and breath sounds normal. She exhibits no tenderness.  Abdominal: Soft. She exhibits no distension. There is tenderness (Epigastric, mild). There is no guarding.  Genitourinary:  Anal exam; normal sphincter tone. Left anterior hemorrhoid tissue that is bleeding somewhat. There is no associated thrombosed hemorrhoid.  Digital rectal exam revealed no stool, but a small amount of blood on the examining finger.  Musculoskeletal: Normal range of motion.  Neurological: She is alert and oriented to person, place, and time. She has normal strength. She exhibits normal muscle tone.  Skin: Skin is warm and dry.  Psychiatric: She has a normal mood and affect. Her behavior is normal. Judgment and thought content normal.    ED Course  Procedures (including critical care time)   Medications  0.9 %  sodium chloride infusion (not administered)  sodium chloride 0.9 % bolus 500 mL (0 mLs Intravenous Stopped 03/11/13 1513)  ondansetron (ZOFRAN) injection 4 mg (4 mg Intravenous Given 03/11/13 1159)   Reevaluation at discharge: Repeat vital signs and orthostatics, are reasssuring   Date: 10/01/2012  Rate: 90  Rhythm: normal sinus rhythm  QRS Axis: normal  PR and QT Intervals: normal  ST/T Wave abnormalities: normal  PR and QRS Conduction Disutrbances:none  Narrative Interpretation:   Old EKG Reviewed: changes noted- rate slower, from 12/13/09   Labs Reviewed  CBC WITH DIFFERENTIAL - Abnormal; Notable for the following:    Hemoglobin 11.8 (*)    HCT 35.1 (*)    MCV 74.1 (*)    MCH 24.9 (*)    Neutrophils Relative 42 (*)    Monocytes Relative 16 (*)    All other components within normal limits  COMPREHENSIVE METABOLIC PANEL - Abnormal; Notable for the following:    Sodium 131 (*)    Chloride 93 (*)    Glucose, Bld 161 (*)    Creatinine, Ser 2.20 (*)    AST 86 (*)    ALT 83 (*)    GFR calc non Af Amer 22 (*)    GFR calc Af Amer 26 (*)    All other components within normal limits  GLUCOSE, CAPILLARY - Abnormal; Notable for the following:    Glucose-Capillary 156 (*)    All other components within normal limits  GLUCOSE, CAPILLARY - Abnormal; Notable for the following:    Glucose-Capillary 150 (*)    All other components within normal limits  OCCULT BLOOD X 1 CARD TO LAB, STOOL   Nursing Notes  Reviewed/ Care Coordinated, and agree without changes. Applicable Imaging Reviewed Interpretation of Laboratory Data incorporated into ED treatment   1. Gastroenteritis   2. Rectal bleeding   3. Hemorrhoid       MDM  Evaluation is consistent with viral gastroenteritis with hemorrhoidal bleeding.  Doubt sepsis, bacterial enteritis or impending vascular collapse. She is stable for discharge.    Plan: Home Medications- Zofran; Home Treatments- advance diet; Recommended follow up- PCP prn          Flint Melter, MD 03/11/13 [phone]

## 2013-03-11 NOTE — ED Notes (Signed)
Pt verbalizes understanding 

## 2013-03-11 NOTE — ED Notes (Signed)
Has had diarrhea for few days--not eating well-- noticed red blood in commode after bowel movements started 2 days ago--

## 2013-03-11 NOTE — ED Notes (Signed)
Called back to room by pts sister- states pt is having a seizure. Pt unresponsive to verbal stimuli, right arm jerking/twitching- physician notified. Saline lock started-

## 2013-03-11 NOTE — ED Notes (Signed)
Called to pts room by sister-- states pt felt hot, funny feeling-- cool cloth given to patient, c/o nausea--

## 2013-04-20 ENCOUNTER — Ambulatory Visit (INDEPENDENT_AMBULATORY_CARE_PROVIDER_SITE_OTHER): Payer: Medicare HMO | Admitting: Physician Assistant

## 2013-04-20 VITALS — BP 182/70 | HR 123 | Temp 98.4°F | Resp 16 | Ht 69.0 in | Wt 254.0 lb

## 2013-04-20 DIAGNOSIS — E669 Obesity, unspecified: Secondary | ICD-10-CM

## 2013-04-20 DIAGNOSIS — I10 Essential (primary) hypertension: Secondary | ICD-10-CM

## 2013-04-20 DIAGNOSIS — Z1239 Encounter for other screening for malignant neoplasm of breast: Secondary | ICD-10-CM

## 2013-04-20 DIAGNOSIS — N390 Urinary tract infection, site not specified: Secondary | ICD-10-CM

## 2013-04-20 DIAGNOSIS — Z1231 Encounter for screening mammogram for malignant neoplasm of breast: Secondary | ICD-10-CM

## 2013-04-20 DIAGNOSIS — Z1159 Encounter for screening for other viral diseases: Secondary | ICD-10-CM

## 2013-04-20 DIAGNOSIS — R7989 Other specified abnormal findings of blood chemistry: Secondary | ICD-10-CM

## 2013-04-20 DIAGNOSIS — D649 Anemia, unspecified: Secondary | ICD-10-CM

## 2013-04-20 DIAGNOSIS — Z1211 Encounter for screening for malignant neoplasm of colon: Secondary | ICD-10-CM

## 2013-04-20 DIAGNOSIS — E119 Type 2 diabetes mellitus without complications: Secondary | ICD-10-CM

## 2013-04-20 DIAGNOSIS — Z23 Encounter for immunization: Secondary | ICD-10-CM

## 2013-04-20 DIAGNOSIS — R809 Proteinuria, unspecified: Secondary | ICD-10-CM

## 2013-04-20 LAB — POCT CBC
Granulocyte percent: 70 %G (ref 37–80)
HCT, POC: 37.9 % (ref 37.7–47.9)
Hemoglobin: 11.8 g/dL — AB (ref 12.2–16.2)
Lymph, poc: 2.3 (ref 0.6–3.4)
MCH, POC: 23.9 pg — AB (ref 27–31.2)
MCHC: 31.1 g/dL — AB (ref 31.8–35.4)
MCV: 76.8 fL — AB (ref 80–97)
MID (cbc): 0.6 (ref 0–0.9)
MPV: 8.5 fL (ref 0–99.8)
POC Granulocyte: 6.7 (ref 2–6.9)
POC LYMPH PERCENT: 23.8 %L (ref 10–50)
POC MID %: 6.2 %M (ref 0–12)
Platelet Count, POC: 382 10*3/uL (ref 142–424)
RBC: 4.94 M/uL (ref 4.04–5.48)
RDW, POC: 17 %
WBC: 9.6 10*3/uL (ref 4.6–10.2)

## 2013-04-20 LAB — POCT GLYCOSYLATED HEMOGLOBIN (HGB A1C): Hemoglobin A1C: 6.4

## 2013-04-20 LAB — GLUCOSE, POCT (MANUAL RESULT ENTRY): POC Glucose: 98 mg/dl (ref 70–99)

## 2013-04-20 MED ORDER — LISINOPRIL 10 MG PO TABS: 10.0000 mg | ORAL_TABLET | Freq: Every day | ORAL | Status: DC

## 2013-04-20 MED ORDER — ZOSTER VACCINE LIVE 19400 UNT/0.65ML ~~LOC~~ SOLR: 0.6500 mL | Freq: Once | SUBCUTANEOUS | Status: DC

## 2013-04-20 MED ORDER — TRIAMTERENE-HCTZ 37.5-25 MG PO CAPS: 1.0000 | ORAL_CAPSULE | Freq: Every day | ORAL | Status: DC

## 2013-04-20 MED ORDER — CLONIDINE HCL 0.2 MG PO TABS: 0.2000 mg | ORAL_TABLET | Freq: Every day | ORAL | Status: DC

## 2013-04-20 NOTE — Patient Instructions (Addendum)
Let me know the information for the mail order pharmacy so I can send your other medications there.  Check your blood pressure and pulse each week and record the results for me to review at your next visit. Record your blood sugar readings for me to review at your next visit.   Try to avoid putting your hands in very hot water (the hotter the water, the more dry your hands can get). DRY COMPLETELY. Apply a heavy cream to your hands after washings.  You need a flu shot in the FALL (if we have them at your next visit in [place], I'll give you one then).  You need a mammogram and a colonoscopy. I've referred you for both, and you can expect calls to schedule them. You are anemic, so it's EXTRA important that you schedule the colonoscopy.  I have given you a prescription for the shingles vaccine (Zostavax).  Take it to the pharmacy, and they can administer it there ([facility] and [facility] do this).

## 2013-04-20 NOTE — Progress Notes (Signed)
Subjective:    Patient ID: Frances Cabrera, female    DOB: Dec 24, 1945, 67 y.o.   MRN: [id number]  HPI  This 67 y.o. female presents for evaluation of DM type 2, HTN, microalbuminuria.  Patient Active Problem List   Diagnosis Date Noted  . DM type 2 05/30/2012  . HTN (hypertension) 05/30/2012  . Microalbuminuria   . Recurrent UTI   . Obesity   . Elevated LFTs      I last saw her in 07/2012, and she was to RTC in 3 months.  She has a long history of non-compliance, in large part due to lack of health insurance or financial ability to pay copays and purchase medications.  Home BP readings: reportedly "normal," about 140/60 (BP routinely VERY elevated here, and with tachycardia) Frequency of home glucose monitoring: QD 130's fasting Unable to see an a eye specialist annually due to lack of insurance coverage.  Last eye exam was 2+ years ago. Rarely sees a dentist-has fully compensated edentula, and notes the denturesd are rubbing.  She requests Magic Mouthwash. Checks feet daily. Is current with influenza vaccine. Is current with pneumococcal vaccine.  Past medical history, surgical history, family history, social history and problem list reviewed.   Review of Systems Denies chest pain, shortness of breath, HA, dizziness, vision change, nausea, vomiting, diarrhea, constipation, melena, hematochezia, dysuria, increased urinary urgency or frequency, increased hunger or thirst, unintentional weight change, unexplained myalgias or arthralgias.  She does complain of an itchy rash on the LEFT hand at the base of the index finger.  The skin is darkened there, and rough.  It's worse when it gets wet.  Two large recurrent blackheads on her upper back.  Previously these were evacuated, and then became infected.  She recalled a medication made it better, but didn't recall that the medication was treating the cellulitis, rather than the open comedones themselves.  She's been limiting her  use of her dentures after her previous set broke while eating.  She's experiencing intermittent soreness in the mouth as a result of eating without the dentures.    Objective:   Physical Exam Blood pressure 182/70, pulse 123, temperature 98.4 F (36.9 C), temperature source Oral, resp. rate 16, height 5\' 9"  (1.753 m), weight 254 lb (115.214 kg), SpO2 99.00%. Body mass index is 37.49 kg/(m^2). Well-developed, well nourished BF who is awake, alert and oriented, in NAD. HEENT: [place]/AT, PERRL, EOMI.  Sclera and conjunctiva are clear.  EAC are patent, TMs are normal in appearance. Nasal mucosa is pink and moist. OP is clear. Neck: supple, non-tender, no lymphadenopathy, thyromegaly. Heart: RRR, no murmur Lungs: normal effort, CTA Abdomen: normo-active bowel sounds, supple, non-tender, no mass or organomegaly. Extremities: no cyanosis, clubbing or edema. Skin: warm and dry without rash. Psychologic: good mood and appropriate affect, normal speech and behavior.  See DM foot exam.   Results for orders placed in visit on 04/20/13  GLUCOSE, POCT (MANUAL RESULT ENTRY)      Result Value Range   POC Glucose 98  70 - 99 mg/dl  POCT GLYCOSYLATED HEMOGLOBIN (HGB A1C)      Result Value Range   Hemoglobin A1C 6.4    POCT CBC      Result Value Range   WBC 9.6  4.6 - 10.2 K/uL   Lymph, poc 2.3  0.6 - 3.4   POC LYMPH PERCENT 23.8  10 - 50 %L   MID (cbc) 0.6  0 - 0.9   POC MID % 6.2  0 - 12 %M   POC Granulocyte 6.7  2 - 6.9   Granulocyte percent 70.0  37 - 80 %G   RBC 4.94  4.04 - 5.48 M/uL   Hemoglobin 11.8 (*) 12.2 - 16.2 g/dL   HCT, POC 47.8  29.5 - 47.9 %   MCV 76.8 (*) 80 - 97 fL   MCH, POC 23.9 (*) 27 - 31.2 pg   MCHC 31.1 (*) 31.8 - 35.4 g/dL   RDW, POC 62.1     Platelet Count, POC 382  142 - 424 K/uL   MPV 8.5  0 - 99.8 fL       Assessment & Plan:  HTN (hypertension) - Plan: lisinopril (PRINIVIL,ZESTRIL) 10 MG tablet, cloNIDine (CATAPRES) 0.2 MG tablet, triamterene-hydrochlorothiazide  (DYAZIDE) 37.5-25 MG per capsule, Comprehensive metabolic panel, TSH, POCT CBC  DM type 2 - Plan: POCT glucose (manual entry), POCT glycosylated hemoglobin (Hb A1C), Lipid panel  Elevated LFTs - await CMET  Microalbuminuria - Plan: Microalbumin, urine; needs controlled HTN-will bring documentation of controlled BP to next visit.  Obesity - healthy lifestyle modifications  Recurrent UTI - continue post-coital dose of cipro  Need for hepatitis C screening test - Plan: Hepatitis C antibody  Need for shingles vaccine - Plan: zoster vaccine live, PF, (ZOSTAVAX) 30865 UNT/0.65ML injection  Screening for breast cancer - Plan: MM Digital Screening  Screening for colon cancer - Plan: Ambulatory referral to Gastroenterology  Anemia - stable, but needs colonoscopy   Patient Instructions  Let me know the information for the mail order pharmacy so I can send your other medications there.  Check your blood pressure and pulse each week and record the results for me to review at your next visit. Record your blood sugar readings for me to review at your next visit.   Try to avoid putting your hands in very hot water (the hotter the water, the more dry your hands can get). DRY COMPLETELY. Apply a heavy cream to your hands after washings.  You need a flu shot in the FALL (if we have them at your next visit in [place], I'll give you one then).  You need a mammogram and a colonoscopy. I've referred you for both, and you can expect calls to schedule them. You are anemic, so it's EXTRA important that you schedule the colonoscopy.  I have given you a prescription for the shingles vaccine (Zostavax).  Take it to the pharmacy, and they can administer it there ([facility] and [facility] do this).     Fernande Bras, PA-C Physician Assistant-Certified [facility]

## 2013-04-21 ENCOUNTER — Other Ambulatory Visit: Payer: Self-pay

## 2013-04-21 DIAGNOSIS — I10 Essential (primary) hypertension: Secondary | ICD-10-CM

## 2013-04-21 DIAGNOSIS — E119 Type 2 diabetes mellitus without complications: Secondary | ICD-10-CM

## 2013-04-21 DIAGNOSIS — N390 Urinary tract infection, site not specified: Secondary | ICD-10-CM

## 2013-04-21 LAB — COMPREHENSIVE METABOLIC PANEL
ALT: 51 U/L — ABNORMAL HIGH (ref 0–35)
AST: 44 U/L — ABNORMAL HIGH (ref 0–37)
Albumin: 4.8 g/dL (ref 3.5–5.2)
Alkaline Phosphatase: 46 U/L (ref 39–117)
BUN: 12 mg/dL (ref 6–23)
CO2: 23 mEq/L (ref 19–32)
Calcium: 10.1 mg/dL (ref 8.4–10.5)
Chloride: 100 mEq/L (ref 96–112)
Creat: 1 mg/dL (ref 0.50–1.10)
Glucose, Bld: 99 mg/dL (ref 70–99)
Potassium: 4 mEq/L (ref 3.5–5.3)
Sodium: 137 mEq/L (ref 135–145)
Total Bilirubin: 0.3 mg/dL (ref 0.3–1.2)
Total Protein: 8.4 g/dL — ABNORMAL HIGH (ref 6.0–8.3)

## 2013-04-21 LAB — LIPID PANEL
Cholesterol: 182 mg/dL (ref 0–200)
HDL: 49 mg/dL (ref >39)
LDL Cholesterol: 104 mg/dL — ABNORMAL HIGH (ref 0–99)
Total CHOL/HDL Ratio: 3.7 Ratio
Triglycerides: 147 mg/dL (ref <150)
VLDL: 29 mg/dL (ref 0–40)

## 2013-04-21 LAB — TSH: TSH: 3.295 u[IU]/mL (ref 0.350–4.500)

## 2013-04-21 LAB — MICROALBUMIN, URINE: Microalb, Ur: 16.86 mg/dL — ABNORMAL HIGH (ref 0.00–1.89)

## 2013-04-21 LAB — HEPATITIS C ANTIBODY: HCV Ab: NEGATIVE

## 2013-04-21 NOTE — Telephone Encounter (Signed)
Patient wanted to pass along pharmacy info that [facility] wants her to use to Chelle. She did not have the information when she was here.  [facility] [id number] [place], [place] [address] Tel [phone] Fax [phone]

## 2013-04-21 NOTE — Telephone Encounter (Signed)
To you , FYI I have saved this pharmacy in her med area of chart.

## 2013-04-22 NOTE — Telephone Encounter (Signed)
Please ask patient which medications she's ready for me to send to the mail order pharmacy.  I think it's: Cipro WelChol Metformin Verapamil  [facility], because she needed them right away.  Does she also want them sent to the mail order now? Clonidine Lisinopril Triamterene-HCTZ

## 2013-04-22 NOTE — Telephone Encounter (Signed)
She wants ALL of these sent in for her to mail order. Pended them, she states she has not been taking the Wel Chol, wants to know if you want her to restart this,

## 2013-04-26 ENCOUNTER — Encounter: Payer: Self-pay | Admitting: Physician Assistant

## 2013-04-26 MED ORDER — METFORMIN HCL 1000 MG PO TABS: 1000.0000 mg | ORAL_TABLET | Freq: Two times a day (BID) | ORAL | Status: DC

## 2013-04-26 MED ORDER — LISINOPRIL 10 MG PO TABS: 10.0000 mg | ORAL_TABLET | Freq: Every day | ORAL | Status: DC

## 2013-04-26 MED ORDER — CIPROFLOXACIN HCL 500 MG PO TABS: ORAL_TABLET | ORAL | Status: DC

## 2013-04-26 MED ORDER — TRIAMTERENE-HCTZ 37.5-25 MG PO CAPS: 1.0000 | ORAL_CAPSULE | Freq: Every day | ORAL | Status: DC

## 2013-04-26 MED ORDER — CLONIDINE HCL 0.2 MG PO TABS: 0.2000 mg | ORAL_TABLET | Freq: Every day | ORAL | Status: DC

## 2013-04-26 MED ORDER — COLESEVELAM HCL 625 MG PO TABS: 1250.0000 mg | ORAL_TABLET | Freq: Two times a day (BID) | ORAL | Status: DC

## 2013-04-30 ENCOUNTER — Telehealth: Payer: Self-pay

## 2013-04-30 NOTE — Telephone Encounter (Signed)
Pt is needing to talk with someone about her mail order rx they say we havent sent it   Best number [phone]

## 2013-05-01 NOTE — Telephone Encounter (Signed)
Faxed rx's this am to [facility].  Apologized to pt because they were not sent earlier

## 2013-05-03 ENCOUNTER — Telehealth: Payer: Self-pay

## 2013-05-03 NOTE — Telephone Encounter (Signed)
Chelle,  Patient wants to know if it okay for her to start water - aerobics.   [phone]

## 2013-05-03 NOTE — Telephone Encounter (Signed)
Absolutely, YES!!!

## 2013-05-04 NOTE — Telephone Encounter (Signed)
I have called her to advise.  

## 2013-05-10 ENCOUNTER — Telehealth: Payer: Self-pay

## 2013-05-10 NOTE — Telephone Encounter (Signed)
Pended the Verapamil, have not found the Cream for Hemorrhoids. Please advise.

## 2013-05-10 NOTE — Telephone Encounter (Signed)
PT STATES SHE USES THE MAIL ORDER AND THEY RECEIVED ALL HER MEDS EXCEPT THE VERAPAMIL AND A CREAM FOR HEMORRHOIDS. SHE THINK CHELLE MAY HAVE FORGOTTEN ABOUT THOSE PLEASE CALL PT AT [phone] AND SHE WOULD LIKE [place] TO CALL [facility] AT [phone] FOR HER MEDICINE

## 2013-05-11 MED ORDER — VERAPAMIL HCL ER 240 MG PO TBCR: 240.0000 mg | EXTENDED_RELEASE_TABLET | Freq: Two times a day (BID) | ORAL | Status: DC

## 2013-05-11 MED ORDER — HYDROCORTISONE ACETATE 25 MG RE SUPP: 25.0000 mg | Freq: Two times a day (BID) | RECTAL | Status: DC

## 2013-05-11 NOTE — Telephone Encounter (Signed)
Meds ordered this encounter  Medications  . verapamil (CALAN-SR) 240 MG CR tablet    Sig: Take 1 tablet (240 mg total) by mouth 2 (two) times daily.    Dispense:  180 tablet    Refill:  1  . hydrocortisone (ANUSOL-HC) 25 MG suppository    Sig: Place 1 suppository (25 mg total) rectally 2 (two) times daily.    Dispense:  12 suppository    Refill:  2    Order Specific Question:  Supervising Provider    Answer:  DOOLITTLE, ROBERT P [[id number]]

## 2013-05-11 NOTE — Telephone Encounter (Signed)
Please call patient to find out what she is using for her hemorrhoids.

## 2013-05-11 NOTE — Telephone Encounter (Signed)
Patient advised Verapamil sent in for her, she states hemorrhoid cream would be new Rx, and she had discussed this with Chelle at office visit. Please advise.

## 2013-05-16 ENCOUNTER — Telehealth: Payer: Self-pay

## 2013-05-16 NOTE — Telephone Encounter (Signed)
Patient would like a generic form of her hemroid cream called in because the regular form is $52. Patient cannot afford. Please call at [phone]

## 2013-05-17 MED ORDER — LIDOCAINE 5 % EX OINT: TOPICAL_OINTMENT | CUTANEOUS | Status: DC | PRN

## 2013-05-17 NOTE — Telephone Encounter (Signed)
Done

## 2013-05-17 NOTE — Telephone Encounter (Signed)
Is there another cream?

## 2013-05-25 ENCOUNTER — Telehealth: Payer: Self-pay

## 2013-05-25 NOTE — Telephone Encounter (Signed)
Patient does mail order prescriptions however the medication that is being called has a copay and patient would like one that she doesn't have to pay for. [phone]

## 2013-05-26 MED ORDER — MAGIC MOUTHWASH W/LIDOCAINE: 10.0000 mL | ORAL | Status: DC | PRN

## 2013-05-26 MED ORDER — HYDROCORTISONE 2.5 % RE CREA: TOPICAL_CREAM | Freq: Two times a day (BID) | RECTAL | Status: DC

## 2013-05-26 MED ORDER — FLUCONAZOLE 150 MG PO TABS: 150.0000 mg | ORAL_TABLET | Freq: Once | ORAL | Status: DC

## 2013-05-26 NOTE — Telephone Encounter (Signed)
Diflucan sent to [facility].  Her mail order pharmacy apparently doesn't take electronic prescriptions.  The MMW and Proctozone HC 2.5 % cream have been printed-please fax/call them to [facility].

## 2013-05-26 NOTE — Telephone Encounter (Signed)
Pt states that she spoke with the pharmacist at her [facility] pharmacy and they told her that they will pay for Proctozone HC 2.5% cream 90 day supply for free.  The other cream that we sent in she had a copay.  Also she needs Magic Mouthwash and Diflucan for yeast.

## 2013-05-26 NOTE — Telephone Encounter (Signed)
lmom to cb. 

## 2013-05-26 NOTE — Telephone Encounter (Signed)
Please contact the patient.  I sent the proctozone HC 2.5 % cream to her mail order pharmacy.  Does she want the magic mouthwash sent to mail order, too?  I need more information [id number], as it's not something we normally call in without recent treatment for infection.

## 2013-05-26 NOTE — Telephone Encounter (Signed)
Pt CB and asked that we send the MMW to mail order, she uses that for the mouth sores she gets from her dentures. Pt stated that she would like a Rx for diflucan sent to [facility] locally for the yeast infections that she gets sometimes from taking the Cipro that Chelle has Rxd for her. She is starting to experience the Sxs of yeast infection.

## 2013-05-27 NOTE — Telephone Encounter (Signed)
Faxed Rxs and notified pt. 

## 2013-06-06 ENCOUNTER — Telehealth: Payer: Self-pay

## 2013-06-06 MED ORDER — MAGIC MOUTHWASH W/LIDOCAINE: 10.0000 mL | ORAL | Status: DC | PRN

## 2013-06-06 NOTE — Telephone Encounter (Signed)
Resent Rx since it did not go

## 2013-06-06 NOTE — Telephone Encounter (Signed)
PT STATES SHE USES THE MAIL ORDER PHARMACY, BUT THEY DON'T HAVE THE MAGIC MOUTHWASH, WOULD LIKE [place] TO CALL IT IN TO [facility] ON [address], BUT DON'T WANT TO GET IT FILLED UNTIL SHE IS READY, SHE JUST WANT [place] TO CALL IT IN TO HAVE ON FILE BECAUSE SHE WEARS DENTURES SO SHE NEED IT TO BE MADE FRESH PLEASE CALL [phone]    [facility] ON [address]

## 2013-10-06 ENCOUNTER — Other Ambulatory Visit: Payer: Self-pay | Admitting: Physician Assistant

## 2013-10-06 NOTE — Telephone Encounter (Signed)
Chelle, I do not see where you have Rxd this for pt recently, but do see a visit pt had to ER for rectal bleeding/hemor. I have pended 1 90 day RF of this for your review since it is mail order.

## 2013-11-03 NOTE — Telephone Encounter (Signed)
error 

## 2014-03-05 ENCOUNTER — Telehealth: Payer: Self-pay

## 2014-03-05 MED ORDER — VERAPAMIL HCL ER 240 MG PO TBCR: 240.0000 mg | EXTENDED_RELEASE_TABLET | Freq: Every day | ORAL | Status: DC

## 2014-03-05 NOTE — Telephone Encounter (Signed)
Patient states that she needs a refill on Verapamil 240mg . She is currently in [place] but [facility] [address] and her son can pick it up and deliver it to her.  [phone]

## 2014-03-05 NOTE — Telephone Encounter (Signed)
30 day supply sent to pharmacy.  

## 2014-03-05 NOTE — Telephone Encounter (Signed)
Pt needs to RTC for follow up- no OV since 05/07/13

## 2014-03-05 NOTE — Telephone Encounter (Signed)
Pt is in [place] until the last week of April. She has been taking care of her mom. She is asking for a 30 day supply to get her through until she returns to town.

## 2014-03-06 NOTE — Telephone Encounter (Signed)
LM advised pt rx at pharmacy.

## 2014-05-04 ENCOUNTER — Telehealth: Payer: Self-pay

## 2014-05-04 ENCOUNTER — Ambulatory Visit (INDEPENDENT_AMBULATORY_CARE_PROVIDER_SITE_OTHER): Payer: Medicare HMO | Admitting: Physician Assistant

## 2014-05-04 VITALS — BP 172/74 | HR 127 | Temp 98.4°F | Resp 18 | Ht 69.5 in | Wt 264.0 lb

## 2014-05-04 DIAGNOSIS — R809 Proteinuria, unspecified: Secondary | ICD-10-CM

## 2014-05-04 DIAGNOSIS — K1239 Other oral mucositis (ulcerative): Secondary | ICD-10-CM

## 2014-05-04 DIAGNOSIS — D649 Anemia, unspecified: Secondary | ICD-10-CM

## 2014-05-04 DIAGNOSIS — K649 Unspecified hemorrhoids: Secondary | ICD-10-CM

## 2014-05-04 DIAGNOSIS — K121 Other forms of stomatitis: Secondary | ICD-10-CM

## 2014-05-04 DIAGNOSIS — E119 Type 2 diabetes mellitus without complications: Secondary | ICD-10-CM

## 2014-05-04 DIAGNOSIS — E669 Obesity, unspecified: Secondary | ICD-10-CM

## 2014-05-04 DIAGNOSIS — N390 Urinary tract infection, site not specified: Secondary | ICD-10-CM

## 2014-05-04 DIAGNOSIS — Z23 Encounter for immunization: Secondary | ICD-10-CM

## 2014-05-04 DIAGNOSIS — I10 Essential (primary) hypertension: Secondary | ICD-10-CM

## 2014-05-04 DIAGNOSIS — Z1211 Encounter for screening for malignant neoplasm of colon: Secondary | ICD-10-CM

## 2014-05-04 LAB — POCT CBC
Granulocyte percent: 57.9 %G (ref 37–80)
HCT, POC: 37.3 % — AB (ref 37.7–47.9)
Hemoglobin: 11.5 g/dL — AB (ref 12.2–16.2)
Lymph, poc: 2.1 (ref 0.6–3.4)
MCH, POC: 23.9 pg — AB (ref 27–31.2)
MCHC: 30.8 g/dL — AB (ref 31.8–35.4)
MCV: 77.6 fL — AB (ref 80–97)
MID (cbc): 0.3 (ref 0–0.9)
MPV: 8.7 fL (ref 0–99.8)
POC Granulocyte: 3.2 (ref 2–6.9)
POC LYMPH PERCENT: 36.7 %L (ref 10–50)
POC MID %: 5.4 %M (ref 0–12)
Platelet Count, POC: 361 10*3/uL (ref 142–424)
RBC: 4.81 M/uL (ref 4.04–5.48)
RDW, POC: 15.6 %
WBC: 5.6 10*3/uL (ref 4.6–10.2)

## 2014-05-04 LAB — COMPREHENSIVE METABOLIC PANEL
ALT: 55 U/L — ABNORMAL HIGH (ref 0–35)
AST: 56 U/L — ABNORMAL HIGH (ref 0–37)
Albumin: 4.4 g/dL (ref 3.5–5.2)
Alkaline Phosphatase: 46 U/L (ref 39–117)
BUN: 12 mg/dL (ref 6–23)
CO2: 23 mEq/L (ref 19–32)
Calcium: 9.8 mg/dL (ref 8.4–10.5)
Chloride: 100 mEq/L (ref 96–112)
Creat: 0.82 mg/dL (ref 0.50–1.10)
Glucose, Bld: 148 mg/dL — ABNORMAL HIGH (ref 70–99)
Potassium: 3.9 mEq/L (ref 3.5–5.3)
Sodium: 135 mEq/L (ref 135–145)
Total Bilirubin: 0.5 mg/dL (ref 0.2–1.2)
Total Protein: 8.3 g/dL (ref 6.0–8.3)

## 2014-05-04 LAB — LIPID PANEL
Cholesterol: 178 mg/dL (ref 0–200)
HDL: 51 mg/dL (ref >39)
LDL Cholesterol: 103 mg/dL — ABNORMAL HIGH (ref 0–99)
Total CHOL/HDL Ratio: 3.5 Ratio
Triglycerides: 119 mg/dL (ref <150)
VLDL: 24 mg/dL (ref 0–40)

## 2014-05-04 LAB — GLUCOSE, POCT (MANUAL RESULT ENTRY): POC Glucose: 161 mg/dl — AB (ref 70–99)

## 2014-05-04 LAB — POCT GLYCOSYLATED HEMOGLOBIN (HGB A1C): Hemoglobin A1C: 7.1

## 2014-05-04 MED ORDER — VERAPAMIL HCL ER 240 MG PO TBCR: 240.0000 mg | EXTENDED_RELEASE_TABLET | Freq: Two times a day (BID) | ORAL | Status: DC

## 2014-05-04 MED ORDER — TRIAMTERENE-HCTZ 37.5-25 MG PO CAPS: 1.0000 | ORAL_CAPSULE | Freq: Every day | ORAL | Status: DC

## 2014-05-04 MED ORDER — CIPROFLOXACIN HCL 500 MG PO TABS: ORAL_TABLET | ORAL | Status: DC

## 2014-05-04 MED ORDER — ZOSTER VACCINE LIVE 19400 UNT/0.65ML ~~LOC~~ SOLR: 0.6500 mL | Freq: Once | SUBCUTANEOUS | Status: DC

## 2014-05-04 MED ORDER — LISINOPRIL 10 MG PO TABS: 10.0000 mg | ORAL_TABLET | Freq: Every day | ORAL | Status: DC

## 2014-05-04 MED ORDER — HYDROCORTISONE 2.5 % RE CREA: TOPICAL_CREAM | RECTAL | Status: DC

## 2014-05-04 MED ORDER — MAGIC MOUTHWASH W/LIDOCAINE
10.0000 mL | ORAL | Status: DC | PRN
Start: 2014-05-04 — End: 2014-05-09

## 2014-05-04 MED ORDER — CLONIDINE HCL 0.2 MG PO TABS: 0.2000 mg | ORAL_TABLET | Freq: Every day | ORAL | Status: DC

## 2014-05-04 MED ORDER — METFORMIN HCL 1000 MG PO TABS: 1000.0000 mg | ORAL_TABLET | Freq: Two times a day (BID) | ORAL | Status: DC

## 2014-05-04 NOTE — Progress Notes (Signed)
Subjective:    Patient ID: Frances Cabrera, female    DOB: [birth year], 68 y.o.   MRN: [id number]   PCP: Kennon Portela, MD  Chief Complaint  Patient presents with  . Follow-up    htn and meds    Medications, allergies, past medical history, surgical history, family history, social history and problem list reviewed and updated.  Patient Active Problem List   Diagnosis Date Noted  . DM type 2 05/30/2012  . HTN (hypertension) 05/30/2012  . Microalbuminuria   . Recurrent UTI   . Obesity   . Elevated LFTs    Prior to Admission medications   Medication Sig Start Date End Date Taking? Authorizing Provider  Alum & Mag Hydroxide-Simeth (MAGIC MOUTHWASH W/LIDOCAINE) SOLN Take 10 mLs by mouth every 2 (two) hours as needed. 06/06/13  NO Shilpa Bushee S Krystian Ferrentino, PA-C  cloNIDine (CATAPRES) 0.2 MG tablet Take 1 tablet (0.2 mg total) by mouth daily. 04/22/13  Yes Brecklynn Jian S Kaidan Spengler, PA-C  hydrocortisone (PROCTOZONE-HC) 2.5 % rectal cream Place rectally twice daily. PATIENT NEEDS OFFICE VISIT FOR ADDITIONAL REFILLS 10/06/13  Yes Heather M Marte, PA-C  lisinopril (PRINIVIL,ZESTRIL) 10 MG tablet Take 1 tablet (10 mg total) by mouth daily. 04/22/13  Yes Waylin Dorko S Shacoya Burkhammer, PA-C  metFORMIN (GLUCOPHAGE) 1000 MG tablet Take 1 tablet (1,000 mg total) by mouth 2 (two) times daily with a meal. 04/22/13  Yes Tacara Hadlock S Shogo Larkey, PA-C  Multiple Vitamins-Minerals (MULTIVITAMIN WITH MINERALS) tablet Take 1 tablet by mouth at bedtime.    Yes Historical Provider, MD  triamterene-hydrochlorothiazide (DYAZIDE) 37.5-25 MG per capsule Take 1 each (1 capsule total) by mouth daily. 04/22/13  Yes Gennell How S Tyneisha Hegeman, PA-C  verapamil (CALAN-SR) 240 MG CR tablet Take 1 tablet (240 mg total) by mouth at bedtime. PATIENT NEEDS OFFICE VISIT FOR ADDITIONAL REFILLS 03/05/14  Yes Eleanore Kurtis Bushman, PA-C  ciprofloxacin (CIPRO) 500 MG tablet Take 1 PO after sex.  If symptoms develop, take 1 PO BID x 5 days. 04/22/13  NO Ravenna Legore S Jadira Nierman, PA-C    colesevelam (WELCHOL) 625 MG tablet Take 2 tablets (1,250 mg total) by mouth 2 (two) times daily with a meal. 04/22/13  NO Fara Chute, PA-C    HPI I last saw her 04/20/2013. Has run out of medications. Has been living with her mother in [place] for about a year, due to her mother's declining health. Has gained 10 lbs.  Frequency of home glucose monitoring: QD fasting, 140's-150's No regular eye or dental exams.  She has fully compensated edentula. Checks feet daily. Is not current with influenza vaccine. Is current with pneumococcal vaccine (07/2012, age 15; has not had Prevnar). Has not filled the prescription I provided for shingles vaccine last year.   Review of Systems Denies chest pain, shortness of breath, HA, dizziness, vision change, nausea, vomiting, diarrhea, constipation, melena, hematochezia, dysuria, increased urinary urgency or frequency, increased hunger or thirst, unintentional weight change, unexplained myalgias or arthralgias, rash.     Objective:   Physical Exam  Vitals reviewed. Constitutional: She is oriented to person, place, and time. She appears well-developed and well-nourished. No distress.  Eyes: Conjunctivae are normal. No scleral icterus.  Neck: No thyromegaly present.  Cardiovascular: Normal rate, regular rhythm, normal heart sounds and intact distal pulses.   Pulmonary/Chest: Effort normal and breath sounds normal.  Lymphadenopathy:    She has no cervical adenopathy.  Neurological: She is alert and oriented to person, place, and time.  Skin: Skin is warm and dry.  Psychiatric: She has a normal mood and affect. Her behavior is normal.   See diabetic foot exam.  Results for orders placed in visit on 05/04/14  POCT CBC      Result Value Ref Range   WBC 5.6  4.6 - 10.2 K/uL   Lymph, poc 2.1  0.6 - 3.4   POC LYMPH PERCENT 36.7  10 - 50 %L   MID (cbc) 0.3  0 - 0.9   POC MID % 5.4  0 - 12 %M   POC Granulocyte 3.2  2 - 6.9   Granulocyte  percent 57.9  37 - 80 %G   RBC 4.81  4.04 - 5.48 M/uL   Hemoglobin 11.5 (*) 12.2 - 16.2 g/dL   HCT, POC 37.3 (*) 37.7 - 47.9 %   MCV 77.6 (*) 80 - 97 fL   MCH, POC 23.9 (*) 27 - 31.2 pg   MCHC 30.8 (*) 31.8 - 35.4 g/dL   RDW, POC 15.6     Platelet Count, POC 361  142 - 424 K/uL   MPV 8.7  0 - 99.8 fL  GLUCOSE, POCT (MANUAL RESULT ENTRY)      Result Value Ref Range   POC Glucose 161 (*) 70 - 99 mg/dl  POCT GLYCOSYLATED HEMOGLOBIN (HGB A1C)      Result Value Ref Range   Hemoglobin A1C 7.1          Assessment & Plan:  1. DM type 2 Controlled. Continue current treatment. Stressed the importance of healthy eating and regular exercise, as well as regular followup. - POCT glucose (manual entry) - POCT glycosylated hemoglobin (Hb A1C) - Comprehensive metabolic panel - Lipid panel - Microalbumin, urine - metFORMIN (GLUCOPHAGE) 1000 MG tablet; Take 1 tablet (1,000 mg total) by mouth 2 (two) times daily with a meal.  Dispense: 180 tablet; Refill: 3  2. HTN (hypertension) Uncontrolled.  Notes that she's been taking the verapamil only QD instead of BID due to running out. She will increase that back to BID and check her BP weekly.  If it is consistently above 140/90, plan increase in lisinopril dose to 20 mg. - POCT CBC - TSH - cloNIDine (CATAPRES) 0.2 MG tablet; Take 1 tablet (0.2 mg total) by mouth daily.  Dispense: 90 tablet; Refill: 3 - lisinopril (PRINIVIL,ZESTRIL) 10 MG tablet; Take 1 tablet (10 mg total) by mouth daily.  Dispense: 90 tablet; Refill: 3 - triamterene-hydrochlorothiazide (DYAZIDE) 37.5-25 MG per capsule; Take 1 each (1 capsule total) by mouth daily.  Dispense: 90 capsule; Refill: 3 - verapamil (CALAN-SR) 240 MG CR tablet; Take 1 tablet (240 mg total) by mouth 2 (two) times daily.  Dispense: 180 tablet; Refill: 3  3. Microalbuminuria Await result.  Expect it will be elevated due to uncontrolled HTN.  4. Recurrent UTI Post-coital. - ciprofloxacin (CIPRO) 500 MG  tablet; Take 1 PO after sex.  If symptoms develop, take 1 PO BID x 5 days.  Dispense: 30 tablet; Refill: 0  5. Obesity Exercise, healthy eating.  6. Hemorrhoid Intermittent pain/bleeding due to straining with constipation. - hydrocortisone (PROCTOZONE-HC) 2.5 % rectal cream; Place rectally twice daily.  Dispense: 90 g; Refill: 1  7. Traumatic stomatitis Her dentures rub intermittently. - Alum & Mag Hydroxide-Simeth (MAGIC MOUTHWASH W/LIDOCAINE) SOLN; Take 10 mLs by mouth every 2 (two) hours as needed.  Dispense: 360 mL; Refill: 3  8. Need for shingles vaccine - zoster vaccine live, PF, (ZOSTAVAX) 83662 UNT/0.65ML injection; Inject 19,400 Units into the skin once.  Dispense: 1 each; Refill: 0  9. Need for vaccination with 13-polyvalent pneumococcal conjugate vaccine - Pneumococcal conjugate vaccine 13-valent IM  10. Anemia She's never had a colonoscopy.  Home iFOBT kit. GI referral. - IFOBT POC (occult bld, rslt in office) - Ambulatory referral to Gastroenterology  11. Screening for colon cancer See above. - IFOBT POC (occult bld, rslt in office) - Ambulatory referral to Gastroenterology  Return in about 3 months (around 08/04/2014) for re-evaluation of diabetes and high blood pressure.   Fara Chute, PA-C Physician Assistant-Certified [facility]

## 2014-05-04 NOTE — Addendum Note (Signed)
Addended by: Johnnette LitterARDWELL, Srinidhi Landers M on: 05/04/2014 08:28 PM   Modules accepted: Orders

## 2014-05-04 NOTE — Telephone Encounter (Signed)
Came in to be seen today and patient says that all of her prescription were sent to [facility]. Only one prescription was supposed [facility] and the rest through [facility] order. Please call patient to clarify [phone]

## 2014-05-04 NOTE — Patient Instructions (Signed)
Please check your blood pressure once a week and record it.  If your pressure is consistently above 140/90, please let me know.

## 2014-05-04 NOTE — Telephone Encounter (Signed)
All of [facility] printed earlier [facility] Re[facility] them to go to her mail order.

## 2014-05-05 LAB — TSH: TSH: 4.176 u[IU]/mL (ref 0.350–4.500)

## 2014-05-05 LAB — MICROALBUMIN, URINE: Microalb, Ur: 2.19 mg/dL — ABNORMAL HIGH (ref 0.00–1.89)

## 2014-05-09 ENCOUNTER — Telehealth: Payer: Self-pay

## 2014-05-09 ENCOUNTER — Encounter: Payer: Self-pay | Admitting: Physician Assistant

## 2014-05-09 MED ORDER — TRIAMTERENE-HCTZ 37.5-25 MG PO CAPS: 1.0000 | ORAL_CAPSULE | Freq: Every day | ORAL | Status: DC

## 2014-05-09 MED ORDER — LISINOPRIL 10 MG PO TABS: 10.0000 mg | ORAL_TABLET | Freq: Every day | ORAL | Status: DC

## 2014-05-09 MED ORDER — HYDROCORTISONE 2.5 % RE CREA: TOPICAL_CREAM | RECTAL | Status: DC

## 2014-05-09 MED ORDER — METFORMIN HCL 1000 MG PO TABS: 1000.0000 mg | ORAL_TABLET | Freq: Two times a day (BID) | ORAL | Status: DC

## 2014-05-09 MED ORDER — CLONIDINE HCL 0.2 MG PO TABS: 0.2000 mg | ORAL_TABLET | Freq: Every day | ORAL | Status: DC

## 2014-05-09 MED ORDER — MAGIC MOUTHWASH W/LIDOCAINE: 10.0000 mL | ORAL | Status: DC | PRN

## 2014-05-09 MED ORDER — CIPROFLOXACIN HCL 500 MG PO TABS: ORAL_TABLET | ORAL | Status: DC

## 2014-05-09 NOTE — Addendum Note (Signed)
Addended by: Sheppard Plumber A on: 05/09/2014 01:28 PM   Modules accepted: Orders

## 2014-05-09 NOTE — Telephone Encounter (Signed)
We received a fax from [facility] asking for more info on pt's demographics. Called them and was advised she is not a current member. I called pt who checked her info and advised her Rxs needed to go to [facility], not [facility]. I resent them all to [facility], other than Zoster vaccine which was printed for pt and Verapamil which pt wanted sent locally.

## 2014-05-09 NOTE — Telephone Encounter (Signed)
Please get the name, phone number and fax number for the place where she can get diabetic shoes.  There is also generally a form (certification of medical necessity), that I will need.  I will send the prescription.

## 2014-05-09 NOTE — Telephone Encounter (Signed)
Pt saw Dr.Jeffery and discussed diabetic shoes, she said she found a place to receive these she just needs a prescription: please call [phone]

## 2014-05-11 NOTE — Telephone Encounter (Signed)
Pt will find out that information for [place] in the a.m and let us know.

## 2014-05-15 NOTE — Telephone Encounter (Signed)
Spoke with pt. She said she got a big run around with the company that she was trying to use and she is very frustrated. I told her I would ask you what you recommended. Also she wants to know if you were going to prescribe a BP meter? Please advise. Thanks

## 2014-05-16 MED ORDER — BD ASSURE BPM/AUTO WRIST CUFF MISC: Status: DC

## 2014-05-16 NOTE — Telephone Encounter (Signed)
Faxed BP cuff Rx to [facility] at pt's request. Pt stated that the 2 companies [employer] advised her to call in network for her DM shoes are in [place] and [place] and they do not want to come to [place] to fit her. She will try to find a local provider and will have them send [place] an order to complete if she does.

## 2014-05-16 NOTE — Telephone Encounter (Signed)
She shouldn't need a prescription for a BP cuff.  Rx printed at 104.  Will bring to 102 after clinic.

## 2014-06-05 ENCOUNTER — Telehealth: Payer: Self-pay

## 2014-06-05 DIAGNOSIS — K121 Other forms of stomatitis: Secondary | ICD-10-CM

## 2014-06-05 MED ORDER — MAGIC MOUTHWASH W/LIDOCAINE: 10.0000 mL | ORAL | Status: DC | PRN

## 2014-06-05 NOTE — Telephone Encounter (Signed)
PT STATES CHELLE HAD GIVEN HER ALL HER MEDS EXCEPT THE MAGIC MOUTHWASH. PLEASE CALL [phone]    [facility] [address]

## 2014-06-05 NOTE — Telephone Encounter (Signed)
[facility] to [facility], in error

## 2014-06-05 NOTE — Telephone Encounter (Signed)
Patient states that she saw Chelle on 5/21 and that Magic Mouthwash was supposed to be sent to [facility] [address] but they do not have a rx for it. I saw in the chart where it was reordered with 3 refills.  Please advise.

## 2014-06-08 ENCOUNTER — Encounter: Payer: Self-pay | Admitting: Physician Assistant

## 2014-06-19 ENCOUNTER — Other Ambulatory Visit: Payer: Self-pay

## 2014-06-19 MED ORDER — VERAPAMIL HCL ER 240 MG PO TBCR: 240.0000 mg | EXTENDED_RELEASE_TABLET | Freq: Two times a day (BID) | ORAL | Status: DC

## 2015-02-27 ENCOUNTER — Other Ambulatory Visit: Payer: Self-pay | Admitting: Physician Assistant

## 2015-02-28 ENCOUNTER — Other Ambulatory Visit: Payer: Self-pay | Admitting: Physician Assistant

## 2015-05-24 ENCOUNTER — Encounter: Payer: Self-pay | Admitting: *Deleted

## 2015-06-15 ENCOUNTER — Other Ambulatory Visit: Payer: Self-pay | Admitting: Physician Assistant

## 2015-06-15 ENCOUNTER — Telehealth: Payer: Self-pay

## 2015-06-15 MED ORDER — LISINOPRIL 10 MG PO TABS: 10.0000 mg | ORAL_TABLET | Freq: Every day | ORAL | Status: DC

## 2015-06-15 MED ORDER — VERAPAMIL HCL ER 240 MG PO TBCR
240.0000 mg | EXTENDED_RELEASE_TABLET | Freq: Every day | ORAL | Status: DC
Start: 2015-06-15 — End: 2015-06-17

## 2015-06-15 NOTE — Telephone Encounter (Signed)
Spoke with pt, advised Rx's sent to her pharmacy. Pt understood.

## 2015-06-15 NOTE — Telephone Encounter (Signed)
Pt would like to know if she could have 4-5 pills of verapamil (CALAN-SR) 240 MG CR tablet [[id number]][[id number]] and lisinopril (PRINIVIL,ZESTRIL) 10 MG tablet [[id number]][[id number]]. She wants this to last her until she sees Chelle this coming Monday. Please advise at [phone]

## 2015-06-17 ENCOUNTER — Other Ambulatory Visit: Payer: Self-pay | Admitting: Radiology

## 2015-06-17 MED ORDER — LISINOPRIL 10 MG PO TABS: 10.0000 mg | ORAL_TABLET | Freq: Every day | ORAL | Status: DC

## 2015-06-17 MED ORDER — VERAPAMIL HCL ER 240 MG PO TBCR: 240.0000 mg | EXTENDED_RELEASE_TABLET | Freq: Every day | ORAL | Status: DC

## 2015-08-27 ENCOUNTER — Other Ambulatory Visit: Payer: Self-pay | Admitting: Physician Assistant

## 2015-09-05 ENCOUNTER — Ambulatory Visit (INDEPENDENT_AMBULATORY_CARE_PROVIDER_SITE_OTHER): Payer: Commercial Managed Care - HMO | Admitting: Physician Assistant

## 2015-09-05 VITALS — BP 188/114 | HR 132 | Temp 98.5°F | Resp 18 | Ht 69.5 in | Wt 260.0 lb

## 2015-09-05 DIAGNOSIS — I10 Essential (primary) hypertension: Secondary | ICD-10-CM | POA: Diagnosis not present

## 2015-09-05 DIAGNOSIS — R05 Cough: Secondary | ICD-10-CM

## 2015-09-05 DIAGNOSIS — R809 Proteinuria, unspecified: Secondary | ICD-10-CM

## 2015-09-05 DIAGNOSIS — E559 Vitamin D deficiency, unspecified: Secondary | ICD-10-CM | POA: Diagnosis not present

## 2015-09-05 DIAGNOSIS — Z23 Encounter for immunization: Secondary | ICD-10-CM

## 2015-09-05 DIAGNOSIS — E119 Type 2 diabetes mellitus without complications: Secondary | ICD-10-CM

## 2015-09-05 DIAGNOSIS — Z1231 Encounter for screening mammogram for malignant neoplasm of breast: Secondary | ICD-10-CM

## 2015-09-05 DIAGNOSIS — F430 Acute stress reaction: Secondary | ICD-10-CM

## 2015-09-05 DIAGNOSIS — Z780 Asymptomatic menopausal state: Secondary | ICD-10-CM

## 2015-09-05 DIAGNOSIS — G4700 Insomnia, unspecified: Secondary | ICD-10-CM

## 2015-09-05 DIAGNOSIS — E1129 Type 2 diabetes mellitus with other diabetic kidney complication: Secondary | ICD-10-CM

## 2015-09-05 DIAGNOSIS — Z1211 Encounter for screening for malignant neoplasm of colon: Secondary | ICD-10-CM | POA: Diagnosis not present

## 2015-09-05 DIAGNOSIS — N390 Urinary tract infection, site not specified: Secondary | ICD-10-CM

## 2015-09-05 DIAGNOSIS — R059 Cough, unspecified: Secondary | ICD-10-CM

## 2015-09-05 LAB — COMPREHENSIVE METABOLIC PANEL
ALT: 50 U/L — ABNORMAL HIGH (ref 6–29)
AST: 53 U/L — ABNORMAL HIGH (ref 10–35)
Albumin: 4.4 g/dL (ref 3.6–5.1)
Alkaline Phosphatase: 46 U/L (ref 33–130)
BUN: 12 mg/dL (ref 7–25)
CO2: 22 mmol/L (ref 20–31)
Calcium: 9.7 mg/dL (ref 8.6–10.4)
Chloride: 102 mmol/L (ref 98–110)
Creat: 0.78 mg/dL (ref 0.50–0.99)
Glucose, Bld: 136 mg/dL — ABNORMAL HIGH (ref 65–99)
Potassium: 4.3 mmol/L (ref 3.5–5.3)
Sodium: 137 mmol/L (ref 135–146)
Total Bilirubin: 0.4 mg/dL (ref 0.2–1.2)
Total Protein: 7.8 g/dL (ref 6.1–8.1)

## 2015-09-05 LAB — POCT GLYCOSYLATED HEMOGLOBIN (HGB A1C): Hemoglobin A1C: 8

## 2015-09-05 LAB — POCT URINALYSIS DIP (MANUAL ENTRY)
Bilirubin, UA: NEGATIVE
Blood, UA: NEGATIVE
Glucose, UA: NEGATIVE
Ketones, POC UA: NEGATIVE
Leukocytes, UA: NEGATIVE
Nitrite, UA: NEGATIVE
Protein Ur, POC: 100 — AB
Spec Grav, UA: 1.02
Urobilinogen, UA: 0.2
pH, UA: 6

## 2015-09-05 LAB — POCT CBC
Granulocyte percent: 61.3 %G (ref 37–80)
HCT, POC: 38.9 % (ref 37.7–47.9)
Hemoglobin: 12.2 g/dL (ref 12.2–16.2)
Lymph, poc: 2.5 (ref 0.6–3.4)
MCH, POC: 23 pg — AB (ref 27–31.2)
MCHC: 31.3 g/dL — AB (ref 31.8–35.4)
MCV: 73.6 fL — AB (ref 80–97)
MID (cbc): 0.3 (ref 0–0.9)
MPV: 7.4 fL (ref 0–99.8)
POC Granulocyte: 4.5 (ref 2–6.9)
POC LYMPH PERCENT: 34.2 %L (ref 10–50)
POC MID %: 4.5 %M (ref 0–12)
Platelet Count, POC: 355 10*3/uL (ref 142–424)
RBC: 5.29 M/uL (ref 4.04–5.48)
RDW, POC: 15.5 %
WBC: 7.4 10*3/uL (ref 4.6–10.2)

## 2015-09-05 LAB — TSH: TSH: 3.61 u[IU]/mL (ref 0.350–4.500)

## 2015-09-05 LAB — LIPID PANEL
Cholesterol: 203 mg/dL — ABNORMAL HIGH (ref 125–200)
HDL: 50 mg/dL (ref >=46)
LDL Cholesterol: 122 mg/dL (ref <130)
Total CHOL/HDL Ratio: 4.1 Ratio (ref <=5.0)
Triglycerides: 153 mg/dL — ABNORMAL HIGH (ref <150)
VLDL: 31 mg/dL — ABNORMAL HIGH (ref <30)

## 2015-09-05 LAB — POCT URINE PREGNANCY: Preg Test, Ur: NEGATIVE

## 2015-09-05 LAB — GLUCOSE, POCT (MANUAL RESULT ENTRY): POC Glucose: 151 mg/dl — AB (ref 70–99)

## 2015-09-05 MED ORDER — LISINOPRIL 10 MG PO TABS: 10.0000 mg | ORAL_TABLET | Freq: Every day | ORAL | Status: DC

## 2015-09-05 MED ORDER — MELOXICAM 15 MG PO TABS: 15.0000 mg | ORAL_TABLET | Freq: Every day | ORAL | Status: DC

## 2015-09-05 MED ORDER — BD ASSURE BPM/AUTO WRIST CUFF MISC: Status: AC

## 2015-09-05 MED ORDER — FLUCONAZOLE 150 MG PO TABS: 150.0000 mg | ORAL_TABLET | Freq: Once | ORAL | Status: DC

## 2015-09-05 MED ORDER — CIPROFLOXACIN HCL 500 MG PO TABS: ORAL_TABLET | ORAL | Status: DC

## 2015-09-05 MED ORDER — ZOSTER VACCINE LIVE 19400 UNT/0.65ML ~~LOC~~ SOLR: 0.6500 mL | Freq: Once | SUBCUTANEOUS | Status: DC

## 2015-09-05 MED ORDER — CLONIDINE HCL 0.2 MG PO TABS: 0.2000 mg | ORAL_TABLET | Freq: Every day | ORAL | Status: DC

## 2015-09-05 MED ORDER — VERAPAMIL HCL ER 240 MG PO TBCR
240.0000 mg | EXTENDED_RELEASE_TABLET | Freq: Every day | ORAL | Status: DC
Start: 2015-09-05 — End: 2015-10-02

## 2015-09-05 MED ORDER — TRIAMTERENE-HCTZ 37.5-25 MG PO CAPS: 1.0000 | ORAL_CAPSULE | Freq: Every day | ORAL | Status: DC

## 2015-09-05 MED ORDER — METFORMIN HCL 1000 MG PO TABS: 1000.0000 mg | ORAL_TABLET | Freq: Two times a day (BID) | ORAL | Status: DC

## 2015-09-05 MED ORDER — BENZONATATE 100 MG PO CAPS: 100.0000 mg | ORAL_CAPSULE | Freq: Three times a day (TID) | ORAL | Status: DC | PRN

## 2015-09-05 NOTE — Patient Instructions (Addendum)
You need: A mammogram (I've ordered it. They should call you to schedule) A bone density test A colonoscopy An eye exam  Please take your medications as they are prescribed.  I will contact you with your lab results as soon as they are available.   If you have not heard from me in 2 weeks, please contact me.  The fastest way to get your results is to register for My Chart (see the instructions on the last page of this printout).
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
## 2015-09-06 LAB — VITAMIN D 25 HYDROXY (VIT D DEFICIENCY, FRACTURES): Vit D, 25-Hydroxy: 28 ng/mL — ABNORMAL LOW (ref 30–100)

## 2015-09-06 LAB — MICROALBUMIN, URINE: Microalb, Ur: 25 mg/dL — ABNORMAL HIGH (ref <2.0)

## 2015-09-08 ENCOUNTER — Telehealth: Payer: Self-pay

## 2015-09-08 NOTE — Telephone Encounter (Signed)
Pt is stating that she saw Frances Cabrera recently and was given tussilin pearls for cough but she is coughing more and nose is now running

## 2015-09-10 ENCOUNTER — Other Ambulatory Visit: Payer: Self-pay

## 2015-09-10 DIAGNOSIS — Z1231 Encounter for screening mammogram for malignant neoplasm of breast: Secondary | ICD-10-CM

## 2015-09-10 MED ORDER — ATORVASTATIN CALCIUM 20 MG PO TABS: 20.0000 mg | ORAL_TABLET | Freq: Every day | ORAL | Status: DC

## 2015-09-10 NOTE — Telephone Encounter (Signed)
Left message for pt to call back  °

## 2015-09-10 NOTE — Addendum Note (Signed)
Addended by: Fernande Bras on: 09/10/2015 01:08 PM   Modules accepted: Orders

## 2015-09-10 NOTE — Telephone Encounter (Signed)
She should continue the tessalon perles. Add OTC Mucinex Maximum Strength (NOT THE ONE WITH "D"), BID and an oral anithistamine (Claritin, Allegra or Zyrtec). Drink lots of water and get plenty of rest.

## 2015-09-10 NOTE — Telephone Encounter (Signed)
Can we send in something else for the cough?

## 2015-09-11 NOTE — Telephone Encounter (Signed)
Spoke with pt, advised message from Chelle. Pt understood. 

## 2015-09-17 ENCOUNTER — Telehealth: Payer: Self-pay

## 2015-09-17 NOTE — Telephone Encounter (Signed)
Pt states chelle gave  Her meds for cough,but she wants to know if she can still go to the Center and do her water aerobics?   Best phone for pt is [phone]

## 2015-09-17 NOTE — Telephone Encounter (Signed)
Can still go to water aerobics with post-viral cough. Should always cover mouth and use good hand washing skills.

## 2015-10-02 ENCOUNTER — Other Ambulatory Visit: Payer: Self-pay | Admitting: Physician Assistant

## 2015-10-16 ENCOUNTER — Ambulatory Visit: Payer: Commercial Managed Care - HMO | Admitting: Physician Assistant

## 2015-11-12 ENCOUNTER — Other Ambulatory Visit: Payer: Self-pay | Admitting: Physician Assistant

## 2015-11-12 MED ORDER — VERAPAMIL HCL ER 240 MG PO TBCR: EXTENDED_RELEASE_TABLET | ORAL | Status: DC

## 2015-11-12 MED ORDER — LISINOPRIL 10 MG PO TABS: ORAL_TABLET | ORAL | Status: DC

## 2015-11-21 ENCOUNTER — Other Ambulatory Visit: Payer: Self-pay | Admitting: Physician Assistant

## 2015-11-28 ENCOUNTER — Encounter: Payer: Self-pay | Admitting: Physician Assistant

## 2015-11-28 ENCOUNTER — Ambulatory Visit (INDEPENDENT_AMBULATORY_CARE_PROVIDER_SITE_OTHER): Payer: Commercial Managed Care - HMO | Admitting: Family Medicine

## 2015-11-28 VITALS — BP 160/90 | HR 141 | Temp 98.7°F | Resp 20 | Ht 70.0 in | Wt 259.4 lb

## 2015-11-28 DIAGNOSIS — N390 Urinary tract infection, site not specified: Secondary | ICD-10-CM

## 2015-11-28 DIAGNOSIS — R000 Tachycardia, unspecified: Secondary | ICD-10-CM | POA: Diagnosis not present

## 2015-11-28 DIAGNOSIS — R809 Proteinuria, unspecified: Secondary | ICD-10-CM

## 2015-11-28 DIAGNOSIS — E119 Type 2 diabetes mellitus without complications: Secondary | ICD-10-CM | POA: Diagnosis not present

## 2015-11-28 DIAGNOSIS — E669 Obesity, unspecified: Secondary | ICD-10-CM

## 2015-11-28 DIAGNOSIS — I10 Essential (primary) hypertension: Secondary | ICD-10-CM

## 2015-11-28 DIAGNOSIS — F430 Acute stress reaction: Secondary | ICD-10-CM | POA: Diagnosis not present

## 2015-11-28 DIAGNOSIS — E1129 Type 2 diabetes mellitus with other diabetic kidney complication: Secondary | ICD-10-CM

## 2015-11-28 LAB — POCT GLYCOSYLATED HEMOGLOBIN (HGB A1C): Hemoglobin A1C: 8.1

## 2015-11-28 MED ORDER — ATORVASTATIN CALCIUM 20 MG PO TABS: 20.0000 mg | ORAL_TABLET | Freq: Every day | ORAL | Status: DC

## 2015-11-28 MED ORDER — TRIAMTERENE-HCTZ 37.5-25 MG PO CAPS: 1.0000 | ORAL_CAPSULE | Freq: Every day | ORAL | Status: DC

## 2015-11-28 MED ORDER — CLONIDINE HCL 0.2 MG PO TABS: 0.2000 mg | ORAL_TABLET | Freq: Every day | ORAL | Status: DC

## 2015-11-28 MED ORDER — VERAPAMIL HCL ER 240 MG PO TBCR: 240.0000 mg | EXTENDED_RELEASE_TABLET | Freq: Two times a day (BID) | ORAL | Status: DC

## 2015-11-28 MED ORDER — CIPROFLOXACIN HCL 500 MG PO TABS: ORAL_TABLET | ORAL | Status: DC

## 2015-11-28 MED ORDER — METFORMIN HCL 1000 MG PO TABS: 1000.0000 mg | ORAL_TABLET | Freq: Two times a day (BID) | ORAL | Status: DC

## 2015-11-28 NOTE — Progress Notes (Signed)
Patient ID: Frances Cabrera, female    DOB: 1945-12-17, 69 y.o.   MRN: [id number]  PCP: Tally Due, MD  Subjective:   Chief Complaint  Patient presents with  . Hypertension    stomach pain  . Depression    see screeing    HPI Presents for evaluation of Diabetes, HTN and lipids, but is not fasting.  Notes that she was taking verapamil BID, but her last prescription was for QD dosing.  Her stress level continues to be very high. Feels overwhelmed. Has to do "everything" at the house. Cooks, cleans. Her dryer doesn't fit in her current home, so she had to put up a clothes line in the back yard and hangs the laundry to dry. "It's a lot of work for one woman." No heat in the car, so is getting rides when she needs to go places. Took her estranged husband back in because he was homeless and needed a spinal surgery due to a work injury. She has been caring for him since his surgery. Feels confused. Doesn't love him, and still has a boyfriend. Her granddaughter was found. She has an infection requiring antibiotics. 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 Her father is trying to place her in a foster home. She won't go to school, complaining that there are girls there who will jump her. She has also spent some time in [place] going through her mother's things, which has been really difficult.  She hasn't followed up on anything we implemented at her last visit: Zostavax, mammogram, new medications, BP cuff. Has reduced the sweet tea she drinks.  Glucose 176 after eating [place] today. She has a glucometer, but needs strips and lancets and lancet device. Isn't sure what brand glucometer she has.  Review of Systems  Constitutional: Positive for activity change (increased).  HENT: Negative.   Eyes: Negative for photophobia, pain and visual disturbance.  Respiratory: Negative for cough, choking, chest tightness, shortness of breath and wheezing.   Cardiovascular: Negative for  chest pain, palpitations and leg swelling.  Gastrointestinal: Negative for nausea, vomiting, diarrhea and constipation.  Endocrine: Negative.   Genitourinary: Negative for dysuria, urgency, frequency and hematuria.  Musculoskeletal: Positive for joint swelling (Intermittent swelling of the LEFT middle finger PIP and the LEFT foot).  Neurological: Negative for dizziness, weakness, light-headedness and headaches.       Patient Active Problem List   Diagnosis Date Noted  . 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 HTN (hypertension) 05/30/2012  . Microalbuminuria   . Recurrent UTI   . Obesity   . Elevated LFTs      Prior to Admission medications   Medication Sig Start Date End Date Taking? Authorizing Provider  cloNIDine (CATAPRES) 0.2 MG tablet Take 1 tablet (0.2 mg total) by mouth daily. 09/05/15  Yes Nadirah Socorro, PA-C  lisinopril (PRINIVIL,ZESTRIL) 10 MG tablet Take 1 tablet (10 mg total) by mouth daily. 11/23/15  Yes Ethelda Chick, MD  meloxicam (MOBIC) 15 MG tablet Take 1 tablet (15 mg total) by mouth daily. 09/05/15  Yes Conor Lata, PA-C  metFORMIN (GLUCOPHAGE) 1000 MG tablet Take 1 tablet (1,000 mg total) by mouth 2 (two) times daily with a meal. 09/05/15  Yes Jodilyn Giese, PA-C  Multiple Vitamins-Minerals (MULTIVITAMIN WITH MINERALS) tablet Take 1 tablet by mouth at bedtime.    Yes Historical Provider, MD  triamterene-hydrochlorothiazide (DYAZIDE) 37.5-25 MG per capsule Take 1 each (1 capsule total) by mouth daily. 09/05/15  Yes Cambreigh Dearing  Francenia Chimenti, PA-C  verapamil (CALAN-SR) 240 MG CR tablet Take 1 tablet (240 mg total) by mouth at bedtime. 11/23/15  Yes Ethelda Chick, MD  Alum & Mag Hydroxide-Simeth (MAGIC MOUTHWASH W/LIDOCAINE) SOLN Take 10 mLs by mouth every 2 (two) hours as needed. Patient not taking: Reported on 11/28/2015 06/05/14   Porfirio Oar, PA-C  atorvastatin (LIPITOR) 20 MG tablet Take 1 tablet (20 mg total) by mouth  daily. Patient not taking: Reported on 11/28/2015 09/10/15   Porfirio Oar, PA-C  benzonatate (TESSALON) 100 MG capsule Take 1-2 capsules (100-200 mg total) by mouth 3 (three) times daily as needed for cough. Patient not taking: Reported on 11/28/2015 09/05/15   Porfirio Oar, PA-C  Blood Pressure Monitoring (B-D ASSURE BPM/AUTO WRIST CUFF) MISC Use to measure home blood pressure. Patient not taking: Reported on 11/28/2015 09/05/15   Porfirio Oar, PA-C  ciprofloxacin (CIPRO) 500 MG tablet Take 1 PO after sex.  If symptoms develop, take 1 PO BID x 5 days. Patient not taking: Reported on 11/28/2015 09/05/15   Porfirio Oar, PA-C  fluconazole (DIFLUCAN) 150 MG tablet Take 1 tablet (150 mg total) by mouth once. Repeat if needed Patient not taking: Reported on 11/28/2015 09/05/15   Porfirio Oar, PA-C  hydrocortisone (PROCTOZONE-HC) 2.5 % rectal cream Place rectally twice daily. Patient not taking: Reported on 09/05/2015 05/09/14   Porfirio Oar, PA-C  zoster vaccine live, PF, (ZOSTAVAX) 16109 UNT/0.65ML injection Inject 19,400 Units into the skin once. Patient not taking: Reported on 11/28/2015 09/05/15   Porfirio Oar, PA-C     Allergies  Allergen Reactions  . Penicillins Hives       Objective:  Physical Exam  Constitutional: She is oriented to person, place, and time. She appears well-developed and well-nourished. She is active and cooperative. She does not have a sickly appearance. No distress.  BP 160/90 mmHg  Pulse 141  Temp(Src) 98.7 F (37.1 C) (Oral)  Resp 20  Ht  (1.778 m)  Wt 259 lb 6.4 oz (117.663 kg)  BMI 37.22 kg/m2  SpO2 98%   HENT:  Head: Normocephalic and atraumatic.  Right Ear: Hearing, tympanic membrane, external ear and ear canal normal.  Left Ear: Hearing, tympanic membrane, external ear and ear canal normal.  Nose: Nose normal.  Mouth/Throat: Uvula is midline, oropharynx is clear and moist and mucous membranes are normal. Dental caries present.  Eyes:  Conjunctivae, EOM and lids are normal. Pupils are equal, round, and reactive to light. No scleral icterus.  Neck: Full passive range of motion without pain and phonation normal. No thyromegaly present.  Cardiovascular: Regular rhythm, normal heart sounds and intact distal pulses.   No extrasystoles are present. Tachycardia present.   No murmur heard. Pulmonary/Chest: Effort normal and breath sounds normal.  Lymphadenopathy:    She has no cervical adenopathy.  Neurological: She is alert and oriented to person, place, and time. She has normal strength. No sensory deficit. She displays a negative Romberg sign.  Skin: Skin is warm and dry. Lesion (resolving excoriation midline mid back) noted. No rash noted.  Psychiatric: Her speech is normal and behavior is normal. Judgment and thought content normal. Her mood appears not anxious. Her affect is not angry, not blunt, not labile and not inappropriate. Cognition and memory are normal. She exhibits a depressed mood.   EKG reviewed with Dr. Katrinka Blazing. Sinus rhythm. Tachycardia. No ischemia.  Results for orders placed or performed in visit on 11/28/15  POCT glycosylated hemoglobin (Hb A1C)  Result Value Ref Range  Hemoglobin A1C 8.1            Assessment & Plan:   1. 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 REsent medications to the mail-order pharmacy, where she can get them free. Encouraged healthy eating again, and reminded her that housework is good activity and can help with weight loss. - POCT glycosylated hemoglobin (Hb A1C) - Comprehensive metabolic panel - Microalbumin, urine - metFORMIN (GLUCOPHAGE) 1000 MG tablet; Take 1 tablet (1,000 mg total) by mouth 2 (two) times daily with a meal.  Dispense: 180 tablet; Refill: 3 - atorvastatin (LIPITOR) 20 MG tablet; Take 1 tablet (20 mg total) by mouth daily.  Dispense: 90 tablet; Refill: 3  2. Microalbuminuria Await labs. If remains elevated, increase  lisinopril. - Microalbumin, urine  3. Essential hypertension Increase verapamil back to BID. That should also help reduce tachycardia. May also need increased ACEI dose if microalbuminuria persists. May need beta blocker as well. - Comprehensive metabolic panel - verapamil (CALAN-SR) 240 MG CR tablet; Take 1 tablet (240 mg total) by mouth 2 (two) times daily.  Dispense: 180 tablet; Refill: 3 - cloNIDine (CATAPRES) 0.2 MG tablet; Take 1 tablet (0.2 mg total) by mouth daily.  Dispense: 90 tablet; Refill: 3 - triamterene-hydrochlorothiazide (DYAZIDE) 37.5-25 MG capsule; Take 1 each (1 capsule total) by mouth daily.  Dispense: 90 capsule; Refill: 3 - Ambulatory referral to Cardiology  4. Reaction, situational, acute, to stress Counseled on the need to actively pursue activities that are stress relieving for her, proceeding with legal separation and divorce, giving a move-out date to her estranged husband, etc.  5. Obesity See above.  6. Recurrent UTI Send Rx to mail order. Not currently symptomatic, but has recurrent and post-coital UTI. - ciprofloxacin (CIPRO) 500 MG tablet; Take 1 PO after sex.  If symptoms develop, take 1 PO BID x 5 days.  Dispense: 30 tablet; Refill: 0  7. Tachycardia Increase verapamil to BID, as above. Likely also needs beta blocker. TSH in 08/2015 was normal. Chart review reveals chronic tachycardia, but worsening. Refer to cardiology. - EKG 12-Lead - Ambulatory referral to Cardiology   Discussed case with Dr. Katrinka BlazingSmith.  Fernande Brashelle S. Devlyn Retter, PA-C Physician Assistant-Certified [facility]

## 2015-11-29 LAB — COMPREHENSIVE METABOLIC PANEL
ALT: 65 U/L — ABNORMAL HIGH (ref 6–29)
AST: 59 U/L — ABNORMAL HIGH (ref 10–35)
Albumin: 4.9 g/dL (ref 3.6–5.1)
Alkaline Phosphatase: 50 U/L (ref 33–130)
BUN: 11 mg/dL (ref 7–25)
CO2: 22 mmol/L (ref 20–31)
Calcium: 10.4 mg/dL (ref 8.6–10.4)
Chloride: 97 mmol/L — ABNORMAL LOW (ref 98–110)
Creat: 0.85 mg/dL (ref 0.50–0.99)
Glucose, Bld: 126 mg/dL — ABNORMAL HIGH (ref 65–99)
Potassium: 3.8 mmol/L (ref 3.5–5.3)
Sodium: 138 mmol/L (ref 135–146)
Total Bilirubin: 0.4 mg/dL (ref 0.2–1.2)
Total Protein: 9 g/dL — ABNORMAL HIGH (ref 6.1–8.1)

## 2015-11-29 LAB — MICROALBUMIN, URINE: Microalb, Ur: 9.6 mg/dL

## 2015-11-29 MED ORDER — LANCET DEVICE MISC: Status: DC

## 2015-11-29 MED ORDER — BLOOD GLUCOSE MONITOR KIT: PACK | Status: DC

## 2015-11-29 MED ORDER — ONETOUCH ULTRASOFT LANCETS MISC: Status: DC

## 2015-12-01 ENCOUNTER — Encounter: Payer: Self-pay | Admitting: Physician Assistant

## 2015-12-12 ENCOUNTER — Encounter: Payer: Self-pay | Admitting: Physician Assistant

## 2015-12-13 MED ORDER — ONETOUCH ULTRASOFT LANCETS MISC: Status: DC

## 2015-12-13 MED ORDER — BLOOD GLUCOSE MONITOR KIT: PACK | Status: DC

## 2015-12-13 MED ORDER — LANCET DEVICE MISC: Status: DC

## 2015-12-13 NOTE — Telephone Encounter (Signed)
I sent in a glucose testing kit and supplies on 12/14, but the patient says that the pharmacy hasn't received it. Please call the pharmacy and straighten it out.

## 2015-12-13 NOTE — Telephone Encounter (Signed)
[facility] They said they do not have record of the prescriptions. I reordered them and faxed them over. Casimiro NeedleMichael was here so ordered under his name so he could sign them.

## 2015-12-14 ENCOUNTER — Other Ambulatory Visit: Payer: Self-pay | Admitting: Physician Assistant

## 2015-12-14 NOTE — Progress Notes (Signed)
History and physical examinations reviewed in detail with PA Jeffery.  EKG reviewed during visit.  Agree with assessment and plan. Infant Doane Martin Reiley Bertagnolli, M.D. [facility]  [place] [address] [place], [place]  [address] [phone] phone [phone] fax  

## 2015-12-15 ENCOUNTER — Encounter: Payer: Self-pay | Admitting: Physician Assistant

## 2015-12-17 NOTE — Telephone Encounter (Signed)
Apparently, her insurance will not cover the prescription I sent for her diabetes testing supplies.   Please call the pharmacy: One touch ultra mini kit (glucometer, lancets, test strips)  I don't know how many test strips and lancets come in the kit, so let's also call in: test strips #100, RF x 3 Lancets #100, RF x 3

## 2015-12-20 ENCOUNTER — Encounter: Payer: Self-pay | Admitting: Physician Assistant

## 2015-12-20 DIAGNOSIS — K649 Unspecified hemorrhoids: Secondary | ICD-10-CM

## 2015-12-20 MED ORDER — HYDROCORTISONE 2.5 % RE CREA: TOPICAL_CREAM | RECTAL | Status: DC

## 2015-12-27 ENCOUNTER — Telehealth: Payer: Self-pay

## 2015-12-27 MED ORDER — HYDROCORTISONE 1 % RE CREA: TOPICAL_CREAM | RECTAL | Status: DC

## 2015-12-27 NOTE — Telephone Encounter (Signed)
[facility] pharm faxed req to change pt's Rx from proctozone 2.5% crm, to Procto-pak 1% crm. They state that this will save pt over $900 per year. Pended w/out sig or quantity (when I put Procto-pak in EPIC it allowed me to choose it under that name, but then this is the medicine that came up when I chose Procto-pak. I put it in comments so there wouldn't be any question, hopefully!

## 2015-12-27 NOTE — Telephone Encounter (Signed)
Meds ordered this encounter  Medications  . hydrocortisone (PROCTOCORT) 1 % CREA    Sig: Apply BID prn hemorroids    Dispense:  28.35 g    Refill:  1    Procto-Pak 1% cream

## 2015-12-28 ENCOUNTER — Telehealth: Payer: Self-pay

## 2015-12-28 NOTE — Telephone Encounter (Signed)
Pt has questions about a hemorroid cream and a diabetes one touch ultra machine strips and lancets  Best number [phone]

## 2015-12-31 ENCOUNTER — Other Ambulatory Visit: Payer: Self-pay

## 2015-12-31 MED ORDER — ONETOUCH ULTRASOFT LANCETS MISC
Status: DC
Start: 2015-12-31 — End: 2020-07-06

## 2015-12-31 MED ORDER — HYDROCORTISONE 1 % RE CREA: TOPICAL_CREAM | RECTAL | Status: DC

## 2015-12-31 MED ORDER — BLOOD GLUCOSE MONITOR KIT: PACK | Status: DC

## 2015-12-31 MED ORDER — ONETOUCH ULTRASOFT LANCETS MISC: Status: DC

## 2015-12-31 MED ORDER — GLUCOSE BLOOD VI STRP
ORAL_STRIP | Status: DC
Start: 2015-12-31 — End: 2016-01-07

## 2015-12-31 NOTE — Telephone Encounter (Signed)
Res[facility] meter [facility] supplies to [facility].

## 2016-01-07 ENCOUNTER — Telehealth: Payer: Self-pay

## 2016-01-07 MED ORDER — ACCU-CHEK AVIVA DEVI
Status: DC
Start: 2016-01-07 — End: 2016-02-08

## 2016-01-07 MED ORDER — GLUCOSE BLOOD VI STRP: ORAL_STRIP | Status: DC

## 2016-01-07 NOTE — Telephone Encounter (Signed)
Got fax from pharm stating One Touch Ultra strips are not covered by ins, only True Metrix and Accu-chek. I will send new Rxs for meter and strips.

## 2016-01-23 ENCOUNTER — Other Ambulatory Visit: Payer: Self-pay | Admitting: Family Medicine

## 2016-01-23 ENCOUNTER — Encounter: Payer: Self-pay | Admitting: Physician Assistant

## 2016-01-23 NOTE — Telephone Encounter (Signed)
Please schedule pt with Chelle.

## 2016-01-24 ENCOUNTER — Encounter: Payer: Self-pay | Admitting: Physician Assistant

## 2016-01-31 ENCOUNTER — Telehealth: Payer: Self-pay

## 2016-01-31 NOTE — Telephone Encounter (Signed)
Pt is needing a refill on proctocort 1% cream and also lisinopril needs to be sent to [facility] number [phone]

## 2016-02-01 MED ORDER — LISINOPRIL 10 MG PO TABS: 10.0000 mg | ORAL_TABLET | Freq: Every day | ORAL | Status: DC

## 2016-02-01 MED ORDER — HYDROCORTISONE 1 % RE CREA
TOPICAL_CREAM | RECTAL | Status: DC
Start: 2016-02-01 — End: 2016-02-08

## 2016-02-01 NOTE — Telephone Encounter (Signed)
Filled this for 90 day supply and also lisinopril pt had called to req. Notified pt on MyChart.

## 2016-02-01 NOTE — Telephone Encounter (Signed)
Sent in both Rxs and notified pt on a Mychart message.

## 2016-02-07 ENCOUNTER — Encounter: Payer: Self-pay | Admitting: Physician Assistant

## 2016-02-07 DIAGNOSIS — E1129 Type 2 diabetes mellitus with other diabetic kidney complication: Secondary | ICD-10-CM

## 2016-02-07 DIAGNOSIS — R809 Proteinuria, unspecified: Principal | ICD-10-CM

## 2016-02-08 MED ORDER — HYDROCORTISONE 1 % RE CREA: TOPICAL_CREAM | RECTAL | Status: DC

## 2016-02-08 MED ORDER — ACCU-CHEK AVIVA DEVI: Status: AC

## 2016-02-08 MED ORDER — GLUCOSE BLOOD VI STRP: ORAL_STRIP | Status: AC

## 2016-02-08 MED ORDER — LANCET DEVICE MISC: Status: AC

## 2016-02-08 NOTE — Telephone Encounter (Signed)
Patient notified via My Chart.    Meds ordered this encounter  Medications  . Blood Glucose Monitoring Suppl (ACCU-CHEK AVIVA) device    Sig: Use to test blood sugar once daily. E11.9    Dispense:  1 each    Refill:  0    Order Specific Question:  Supervising Provider    Answer:  DOOLITTLE, ROBERT P [[id number]]  . glucose blood test strip    Sig: Use to test blood sugar once daily. E11.9    Dispense:  100 each    Refill:  3    ACCU-CHEK AVIVA TEST STRIPS    Order Specific Question:  Supervising Provider    Answer:  DOOLITTLE, ROBERT P [[id number]]  . Lancet Device MISC    Sig: Use for home glucose monitoring    Dispense:  1 each    Refill:  3    Order Specific Question:  Supervising Provider    Answer:  DOOLITTLE, ROBERT P [[id number]]  . hydrocortisone (PROCTOCORT) 1 % CREA    Sig: Apply to affected area BID PRN    Dispense:  85 g    Refill:  1    Order Specific Question:  Supervising Provider    Answer:  DOOLITTLE, ROBERT P [[id number]]

## 2016-02-20 ENCOUNTER — Encounter: Payer: Self-pay | Admitting: Physician Assistant

## 2016-03-26 ENCOUNTER — Encounter: Payer: Self-pay | Admitting: Physician Assistant

## 2016-03-26 MED ORDER — LISINOPRIL 10 MG PO TABS: 10.0000 mg | ORAL_TABLET | Freq: Every day | ORAL | Status: DC

## 2016-03-26 NOTE — Telephone Encounter (Signed)
Patient notified via My Chart.  Meds ordered this encounter  Medications  . lisinopril (PRINIVIL,ZESTRIL) 10 MG tablet    Sig: Take 1 tablet (10 mg total) by mouth daily.    Dispense:  90 tablet    Refill:  0    Order Specific Question:  Supervising Provider    Answer:  DOOLITTLE, ROBERT P [[id number]]

## 2016-04-01 ENCOUNTER — Other Ambulatory Visit: Payer: Self-pay | Admitting: Family Medicine

## 2016-04-17 ENCOUNTER — Encounter: Payer: Self-pay | Admitting: Physician Assistant

## 2016-05-09 ENCOUNTER — Emergency Department (HOSPITAL_COMMUNITY)
Admission: EM | Admit: 2016-05-09 | Discharge: 2016-05-09 | Disposition: A | Discharge status: Home / Self Care | Payer: Commercial Managed Care - HMO | Attending: Emergency Medicine | Admitting: Emergency Medicine

## 2016-05-09 ENCOUNTER — Encounter (HOSPITAL_COMMUNITY): Payer: Self-pay | Admitting: Emergency Medicine

## 2016-05-09 DIAGNOSIS — E669 Obesity, unspecified: Secondary | ICD-10-CM | POA: Insufficient documentation

## 2016-05-09 DIAGNOSIS — R2242 Localized swelling, mass and lump, left lower limb: Secondary | ICD-10-CM | POA: Insufficient documentation

## 2016-05-09 DIAGNOSIS — R2241 Localized swelling, mass and lump, right lower limb: Secondary | ICD-10-CM | POA: Diagnosis not present

## 2016-05-09 DIAGNOSIS — E119 Type 2 diabetes mellitus without complications: Secondary | ICD-10-CM | POA: Insufficient documentation

## 2016-05-09 DIAGNOSIS — Z7984 Long term (current) use of oral hypoglycemic drugs: Secondary | ICD-10-CM | POA: Diagnosis not present

## 2016-05-09 DIAGNOSIS — R600 Localized edema: Secondary | ICD-10-CM

## 2016-05-09 DIAGNOSIS — Z79899 Other long term (current) drug therapy: Secondary | ICD-10-CM | POA: Diagnosis not present

## 2016-05-09 DIAGNOSIS — M7989 Other specified soft tissue disorders: Secondary | ICD-10-CM | POA: Diagnosis present

## 2016-05-09 DIAGNOSIS — I10 Essential (primary) hypertension: Secondary | ICD-10-CM | POA: Insufficient documentation

## 2016-05-09 LAB — COMPREHENSIVE METABOLIC PANEL
ALT: 46 U/L (ref 14–54)
AST: 45 U/L — ABNORMAL HIGH (ref 15–41)
Albumin: 4.6 g/dL (ref 3.5–5.0)
Alkaline Phosphatase: 46 U/L (ref 38–126)
Anion gap: 13 (ref 5–15)
BUN: 14 mg/dL (ref 6–20)
CO2: 22 mmol/L (ref 22–32)
Calcium: 9.8 mg/dL (ref 8.9–10.3)
Chloride: 103 mmol/L (ref 101–111)
Creatinine, Ser: 0.89 mg/dL (ref 0.44–1.00)
GFR calc Af Amer: >60 mL/min (ref >60)
GFR calc non Af Amer: >60 mL/min (ref >60)
Glucose, Bld: 166 mg/dL — ABNORMAL HIGH (ref 65–99)
Potassium: 3.8 mmol/L (ref 3.5–5.1)
Sodium: 138 mmol/L (ref 135–145)
Total Bilirubin: 0.8 mg/dL (ref 0.3–1.2)
Total Protein: 8.8 g/dL — ABNORMAL HIGH (ref 6.5–8.1)

## 2016-05-09 LAB — CBC WITH DIFFERENTIAL/PLATELET
Basophils Absolute: 0 10*3/uL (ref 0.0–0.1)
Basophils Relative: 0 %
Eosinophils Absolute: 0.1 10*3/uL (ref 0.0–0.7)
Eosinophils Relative: 1 %
HCT: 36.5 % (ref 36.0–46.0)
Hemoglobin: 12.3 g/dL (ref 12.0–15.0)
Lymphocytes Relative: 42 %
Lymphs Abs: 3.1 10*3/uL (ref 0.7–4.0)
MCH: 25 pg — ABNORMAL LOW (ref 26.0–34.0)
MCHC: 33.7 g/dL (ref 30.0–36.0)
MCV: 74.2 fL — ABNORMAL LOW (ref 78.0–100.0)
Monocytes Absolute: 0.4 10*3/uL (ref 0.1–1.0)
Monocytes Relative: 6 %
Neutro Abs: 3.7 10*3/uL (ref 1.7–7.7)
Neutrophils Relative %: 51 %
Platelets: 322 10*3/uL (ref 150–400)
RBC: 4.92 MIL/uL (ref 3.87–5.11)
RDW: 15.4 % (ref 11.5–15.5)
WBC: 7.3 10*3/uL (ref 4.0–10.5)

## 2016-05-09 NOTE — Discharge Instructions (Signed)

## 2016-05-09 NOTE — ED Provider Notes (Signed)
CSN: [id number]     Arrival date & time 05/09/16  3212 History   First MD Initiated Contact with Patient 05/09/16 1011     Chief Complaint  Patient presents with  . Foot Swelling  . Hypertension     HPI Pt reports bilateral foot pain and swelling for the past 3 months. Worse on L foot today. Pt able to fit into her own shoes. Sometimes accompanied by foot numbness. Pt hypertensive in triage. Has not taken home BP medications this am. No CP, SOB or HA. Past Medical History  Diagnosis Date  . Hypertension   . Diabetes mellitus   . Microalbuminuria   . Recurrent UTI     post-coital  . Obesity   . Elevated LFTs   . Menopause    Past Surgical History  Procedure Laterality Date  . Tubal ligation    . Cholecystectomy  2000   Family History  Problem Relation Age of Onset  . Diabetes Mother   . Hypertension Mother   . Kidney failure Mother   . Diabetes Father   . Diabetes Brother   . Diabetes Brother    Social History  Substance Use Topics  . Smoking status: Never Smoker   . Smokeless tobacco: None  . Alcohol Use: No   OB History    No data available     Review of Systems  Respiratory: Negative for shortness of breath.   Cardiovascular: Negative for chest pain.  All other systems reviewed and are negative.     Allergies  Penicillins and Shrimp  Home Medications   Prior to Admission medications   Medication Sig Start Date End Date Taking? Authorizing Provider  acetaminophen (TYLENOL) 500 MG tablet Take 500 mg by mouth every 6 (six) hours as needed for moderate pain or headache.   Yes Historical Provider, MD  cloNIDine (CATAPRES) 0.2 MG tablet Take 1 tablet (0.2 mg total) by mouth daily. 11/28/15  Yes Chelle Jeffery, PA-C  esomeprazole (NEXIUM) 20 MG capsule Take 20 mg by mouth daily as needed (acid reflux).   Yes Historical Provider, MD  lisinopril (PRINIVIL,ZESTRIL) 10 MG tablet Take 1 tablet (10 mg total) by mouth daily. 03/26/16  Yes Chelle Jeffery, PA-C   metFORMIN (GLUCOPHAGE) 1000 MG tablet Take 1 tablet (1,000 mg total) by mouth 2 (two) times daily with a meal. 11/28/15  Yes Chelle Jeffery, PA-C  Multiple Vitamins-Minerals (MULTIVITAMIN WITH MINERALS) tablet Take 1 tablet by mouth at bedtime.    Yes Historical Provider, MD  triamterene-hydrochlorothiazide (DYAZIDE) 37.5-25 MG capsule Take 1 each (1 capsule total) by mouth daily. 11/28/15  Yes Chelle Jeffery, PA-C  verapamil (CALAN-SR) 240 MG CR tablet Take 1 tablet (240 mg total) by mouth 2 (two) times daily. Patient taking differently: Take 240 mg by mouth daily.  11/28/15  Yes Chelle Jeffery, PA-C  atorvastatin (LIPITOR) 20 MG tablet Take 1 tablet (20 mg total) by mouth daily. Patient not taking: Reported on 05/09/2016 11/28/15   Harrison Mons, PA-C  blood glucose meter kit and supplies KIT Use to test blood sugar once daily. E11.9 12/31/15   Chelle Jeffery, PA-C  Blood Glucose Monitoring Suppl (ACCU-CHEK AVIVA) device Use to test blood sugar once daily. E11.9 02/08/16 02/07/17  Chelle Jeffery, PA-C  Blood Pressure Monitoring (B-D ASSURE BPM/AUTO WRIST CUFF) MISC Use to measure home blood pressure. Patient not taking: Reported on 11/28/2015 09/05/15   Chelle Jeffery, PA-C  glucose blood test strip Use to test blood sugar once daily. E11.9 02/08/16  Chelle Jeffery, PA-C  hydrocortisone (PROCTOCORT) 1 % CREA Apply to affected area BID PRN Patient not taking: Reported on 05/09/2016 02/08/16   Harrison Mons, PA-C  Lancet Device MISC Use for home glucose monitoring 02/08/16   Harrison Mons, PA-C  Lancets (ONETOUCH ULTRASOFT) lancets Use to test blood sugar once daily. E11.9 12/31/15   Chelle Jeffery, PA-C  zoster vaccine live, PF, (ZOSTAVAX) 95093 UNT/0.65ML injection Inject 19,400 Units into the skin once. Patient not taking: Reported on 11/28/2015 09/05/15   Chelle Jeffery, PA-C   BP 196/99 mmHg  Pulse 116  Temp(Src) 98.6 F (37 C) (Oral)  Resp 14  Ht 5' 11"  (1.803 m)  Wt 260 lb (117.935 kg)   BMI 36.28 kg/m2  SpO2 97% Physical Exam  Constitutional: She is oriented to person, place, and time. She appears well-developed and well-nourished. No distress.  HENT:  Head: Normocephalic and atraumatic.  Eyes: Pupils are equal, round, and reactive to light.  Neck: Normal range of motion.  Cardiovascular: Normal rate and intact distal pulses.   Pulmonary/Chest: No respiratory distress.  Abdominal: Normal appearance. She exhibits no distension.  Musculoskeletal: She exhibits edema.       Right foot: There is swelling.       Left foot: There is swelling.  Neurological: She is alert and oriented to person, place, and time. No cranial nerve deficit.  Skin: Skin is warm and dry. No rash noted.  Psychiatric: She has a normal mood and affect. Her behavior is normal.  Nursing note and vitals reviewed.   ED Course  Procedures (including critical care time) Labs Review Labs Reviewed  COMPREHENSIVE METABOLIC PANEL - Abnormal; Notable for the following:    Glucose, Bld 166 (*)    Total Protein 8.8 (*)    AST 45 (*)    All other components within normal limits  CBC WITH DIFFERENTIAL/PLATELET - Abnormal; Notable for the following:    MCV 74.2 (*)    MCH 25.0 (*)    All other components within normal limits    Imaging Review No results found. I have personally reviewed and evaluated these images and lab results as part of my medical decision-making.   Date: 05/09/2016  Rate: 112  Rhythm: Sinus tachycardia  QRS Axis: normal  Intervals: normal  ST/T Wave abnormalities: Borderline T wave abnormalities  Conduction Disutrbances: Borderline prolonged QT  Narrative Interpretation: Abnormal EKG       MDM   Final diagnoses:  Bilateral edema of lower extremity        Leonard Schwartz, MD 05/09/16 1251

## 2016-05-09 NOTE — ED Notes (Signed)
Pt reports bilateral foot pain and swelling for the past 3 months. Worse on L foot today. Pt able to fit into her own shoes. Sometimes accompanied by foot numbness. Pt hypertensive in triage. Has not taken home BP medications this am. No CP, SOB or HA.

## 2016-05-09 NOTE — ED Notes (Signed)
Beaton at bedside.

## 2016-05-09 NOTE — ED Notes (Addendum)
Per Radford PaxBeaton request pt is taking her home medication now. This includes verapamel 240mg , lisinopril 10mg , clonodine 0.2mg , HTC 25mg , triametrence 20mg .

## 2016-05-31 ENCOUNTER — Encounter: Payer: Self-pay | Admitting: Physician Assistant

## 2016-05-31 ENCOUNTER — Ambulatory Visit (INDEPENDENT_AMBULATORY_CARE_PROVIDER_SITE_OTHER): Payer: Commercial Managed Care - HMO | Admitting: Physician Assistant

## 2016-05-31 ENCOUNTER — Telehealth: Payer: Self-pay

## 2016-05-31 VITALS — BP 192/80 | HR 120 | Temp 98.7°F | Resp 16 | Wt 257.0 lb

## 2016-05-31 DIAGNOSIS — R809 Proteinuria, unspecified: Secondary | ICD-10-CM | POA: Diagnosis not present

## 2016-05-31 DIAGNOSIS — I10 Essential (primary) hypertension: Secondary | ICD-10-CM

## 2016-05-31 DIAGNOSIS — L723 Sebaceous cyst: Secondary | ICD-10-CM | POA: Diagnosis not present

## 2016-05-31 DIAGNOSIS — Z6835 Body mass index (BMI) 35.0-35.9, adult: Secondary | ICD-10-CM | POA: Diagnosis not present

## 2016-05-31 DIAGNOSIS — R600 Localized edema: Secondary | ICD-10-CM | POA: Diagnosis not present

## 2016-05-31 DIAGNOSIS — F329 Major depressive disorder, single episode, unspecified: Secondary | ICD-10-CM

## 2016-05-31 DIAGNOSIS — E119 Type 2 diabetes mellitus without complications: Secondary | ICD-10-CM

## 2016-05-31 DIAGNOSIS — E1129 Type 2 diabetes mellitus with other diabetic kidney complication: Secondary | ICD-10-CM

## 2016-05-31 DIAGNOSIS — L089 Local infection of the skin and subcutaneous tissue, unspecified: Secondary | ICD-10-CM

## 2016-05-31 DIAGNOSIS — F32A Depression, unspecified: Secondary | ICD-10-CM

## 2016-05-31 LAB — TSH: TSH: 3.1 mIU/L

## 2016-05-31 MED ORDER — HYDROXYZINE HCL 25 MG PO TABS: 25.0000 mg | ORAL_TABLET | Freq: Every evening | ORAL | Status: DC | PRN

## 2016-05-31 MED ORDER — LISINOPRIL 20 MG PO TABS: 20.0000 mg | ORAL_TABLET | Freq: Every day | ORAL | Status: DC

## 2016-05-31 MED ORDER — VENLAFAXINE HCL ER 37.5 MG PO TB24: ORAL_TABLET | ORAL | Status: DC

## 2016-05-31 MED ORDER — DOXYCYCLINE HYCLATE 100 MG PO CAPS: 100.0000 mg | ORAL_CAPSULE | Freq: Two times a day (BID) | ORAL | Status: AC

## 2016-05-31 NOTE — Telephone Encounter (Signed)
Patient was seen today and would Chelle to sent her something for yeast infection. Patient was prescribed an antibiotic. [facility] [address]

## 2016-05-31 NOTE — Progress Notes (Signed)
Patient ID: Frances Cabrera, female    DOB: 09/22/1946, 70 y.o.   MRN: [id number]  PCP: Wynne [facility]  Subjective:   Chief Complaint  Patient presents with  . Recurrent Skin Infections    Upper back x56month   . elevated BP  at trirage    HPI Presents for evaluation of a lump on the back. Last visit here was 11/2015. [place] She has a history of chronic non-compliance with medications and follow-up, routinely running out of medications months before returning for re-evaluation.  "I just can't shake it." 4 days ago was the 1 year anniversary of her mother's death. She continues to [place] "I pick up the phone and I just want to talk to her. There's all those clothes." Anticipates another month of things to do, [place] Disagreeing with her sister on how to empty the house, what to do with their mother's belongings. She and her brother and her mother used to talk on the phone every day at 7 pm. I look at the clock every day at 7 o'clock. "I've been holding it in, trying to be strong for the whole family." "I don't have anybody I can talk to." Her granddaughter still has not been located after disappearance from school last year. Has kicked her boyfriend out. [place] women [place], not paying the bills. Her financial situation remains difficult, her income is fixed.  LE swelling whenever she's on her feet.  Seen in the ED 05/09/2016 because I was not here in the office. Presented there with 3 months of LE edema. CBC and CMET performed-essentially normal. AST 45, Total Protein 8.8, Glucose 166. EKG was abnormal: sinus tachycardia, rate 112, borderline prolonged QT and borderline T wave abnormalities. In addition, the lesion on her back was examined, but after her official  discharge, and so not documented in the notes. "He squeezed it and said he got the head out," but I think that it's infected.  She's only taking half the dose of CCB.  11/2015 A1C was 8.1   Review of Systems  Constitutional: Positive for chills (every once in a while) and fatigue. Negative for fever.  HENT: Negative for congestion, postnasal drip, rhinorrhea, sinus pressure and sore throat.   Eyes: Negative for visual disturbance.  Respiratory: Negative for shortness of breath.   Cardiovascular: Positive for leg swelling. Negative for chest pain and palpitations.  Gastrointestinal: Positive for nausea, diarrhea (rapid transit x 3 days) and blood in stool (BRBPR when she has frequent BMs, due to hemorrhoids). Negative for vomiting, abdominal pain and constipation.  Endocrine: Negative.   Genitourinary: Negative for dysuria, urgency, frequency and hematuria.  Neurological: Positive for headaches (this morning, resolved with Tylenol). Negative for dizziness.  Hematological: Negative for adenopathy. Does not bruise/bleed easily.  Psychiatric/Behavioral: Positive for suicidal ideas (no intention or plan), sleep disturbance (intermittently) and dysphoric mood. Negative for self-injury. The patient is nervous/anxious.     [facility] 05/31/2016 05/31/2016 11/28/2015 09/05/2015  Decreased Interest 0 0 1 3  Down, Depressed, Hopeless 0 0 1 3  PHQ - 2 Score 0 0 2 6  Altered sleeping - - 0 1  Tired, decreased energy - - 0 0  Change in appetite - - 0 1  Feeling bad or failure about yourself  - - 0  0  Trouble concentrating - - 0 0  Moving slowly or fidgety/restless - - 0 0  Suicidal thoughts - - 0 0  PHQ-9 Score - - 2 8  Difficult doing work/chores - - - Not difficult at all       Patient Active Problem List   Diagnosis Date Noted  . 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 HTN (hypertension) 05/30/2012  . Microalbuminuria   . Recurrent  UTI   . Obesity   . Elevated LFTs      Prior to Admission medications   Medication Sig Start Date End Date Taking? Authorizing Provider  acetaminophen (TYLENOL) 500 MG tablet Take 500 mg by mouth every 6 (six) hours as needed for moderate pain or headache. Reported on 05/31/2016   Yes Historical Provider, MD  atorvastatin (LIPITOR) 20 MG tablet Take 1 tablet (20 mg total) by mouth daily. 11/28/15  Yes Merlon Alcorta, PA-C  blood glucose meter kit and supplies KIT Use to test blood sugar once daily. E11.9 12/31/15  Yes Saleh Ulbrich, PA-C  Blood Glucose Monitoring Suppl (ACCU-CHEK AVIVA) device Use to test blood sugar once daily. E11.9 02/08/16 02/07/17 Yes Makaleigh Reinard, PA-C  Blood Pressure Monitoring (B-D ASSURE BPM/AUTO WRIST CUFF) MISC Use to measure home blood pressure. 09/05/15  Yes Camari Quintanilla, PA-C  cloNIDine (CATAPRES) 0.2 MG tablet Take 1 tablet (0.2 mg total) by mouth daily. 11/28/15  Yes Rogene Meth, PA-C  glucose blood test strip Use to test blood sugar once daily. E11.9 02/08/16  Yes Harrison Mons, PA-C  Lancet Device MISC Use for home glucose monitoring 02/08/16  Yes Jhovanny Guinta, PA-C  Lancets (ONETOUCH ULTRASOFT) lancets Use to test blood sugar once daily. E11.9 12/31/15  Yes Dietrich Ke, PA-C  lisinopril (PRINIVIL,ZESTRIL) 10 MG tablet Take 1 tablet (10 mg total) by mouth daily. 03/26/16  Yes Navaeh Kehres, PA-C  metFORMIN (GLUCOPHAGE) 1000 MG tablet Take 1 tablet (1,000 mg total) by mouth 2 (two) times daily with a meal. 11/28/15  Yes Raoul Ciano, PA-C  Multiple Vitamins-Minerals (MULTIVITAMIN WITH MINERALS) tablet Take 1 tablet by mouth at bedtime. Reported on 05/31/2016   Yes Historical Provider, MD  triamterene-hydrochlorothiazide (DYAZIDE) 37.5-25 MG capsule Take 1 each (1 capsule total) by mouth daily. 11/28/15  Yes Nil Xiong, PA-C  verapamil (CALAN-SR) 240 MG CR tablet Take 1 tablet (240 mg total) by mouth 2 (two) times daily. 11/28/15  Yes Kassem Kibbe,  PA-C  zoster vaccine live, PF, (ZOSTAVAX) 76811 UNT/0.65ML injection Inject 19,400 Units into the skin once. 09/05/15  Yes Harrison Mons, PA-C     Allergies  Allergen Reactions  . Penicillins Hives    Has patient had a PCN reaction causing immediate rash, facial/tongue/throat swelling, SOB or lightheadedness with hypotension: Yes Has patient had a PCN reaction causing severe rash involving mucus membranes or skin necrosis: Yes Has patient had a PCN reaction that required hospitalization No Has patient had a PCN reaction occurring within the last 10 years: No If all of the above answers are "NO", then may proceed with Cephalosporin use.   . Shrimp [Shellfish Allergy] Rash       Objective:  Physical Exam  Constitutional: She is oriented to person, place, and time. She appears well-developed and well-nourished. She is active and cooperative. No distress.  BP 192/80 mmHg  Pulse 120  Temp(Src) 98.7 F (37.1 C) (Oral)  Resp 16  Wt 257 lb (116.574 kg)  SpO2 99%  HENT:  Head: Normocephalic and atraumatic.  Right Ear: Hearing normal.  Left Ear: Hearing normal.  Eyes: Conjunctivae are normal. No scleral icterus.  Neck: Normal range of motion. Neck supple. No thyromegaly present.  Cardiovascular: Normal rate, regular rhythm and normal heart sounds.   Pulses:      Radial pulses are 2+ on the right side, and 2+ on the left side.  Pulmonary/Chest: Effort normal and breath sounds normal.  Lymphadenopathy:       Head (right side): No tonsillar, no preauricular, no posterior auricular and no occipital adenopathy present.       Head (left side): No tonsillar, no preauricular, no posterior auricular and no occipital adenopathy present.    She has no cervical adenopathy.       Right: No supraclavicular adenopathy present.       Left: No supraclavicular adenopathy present.  Neurological: She is alert and oriented to person, place, and time. No sensory deficit.  Skin: Skin is warm, dry and  intact. No rash noted. No cyanosis or erythema. Nails show no clubbing.     Psychiatric: Her speech is normal and behavior is normal. Judgment and thought content normal. Her mood appears not anxious. Her affect is blunt. Her affect is not angry, not labile and not inappropriate. Cognition and memory are normal. She exhibits a depressed mood.           Assessment & Plan:   1. 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 Encouraged healthy eating and regular exercise. - Microalbumin, urine - Hemoglobin A1c  2. Microalbuminuria Maximize control of glucose and HTN. Increase lisinopril to 20 mg. - lisinopril (PRINIVIL,ZESTRIL) 20 MG tablet; Take 1 tablet (20 mg total) by mouth daily.  Dispense: 90 tablet; Refill: 3  3. Bilateral edema of lower extremity Likely due to uncontrolled HTN. Increase lisinopril. Consider increasing diuretic dose.  4. Essential hypertension Increase lisinopril dose to 20 mg. - TSH - lisinopril (PRINIVIL,ZESTRIL) 20 MG tablet; Take 1 tablet (20 mg total) by mouth daily.  Dispense: 90 tablet; Refill: 3  5. Infected sebaceous cyst She declines excision today. She will return for that at her convenience. - doxycycline (VIBRAMYCIN) 100 MG capsule; Take 1 capsule (100 mg total) by mouth 2 (two) times daily.  Dispense: 20 capsule; Refill: 0  6. Depression Start venlafaxine ER. Vistaril to help sleep. Reassess in 4-6 weeks. - Venlafaxine HCl 37.5 MG TB24; Take one each morning x 1 week, then increase to 2 each morning.  Dispense: 60 each; Refill: 3 - hydrOXYzine (ATARAX/VISTARIL) 25 MG tablet; Take 1-3 tablets (25-75 mg total) by mouth at bedtime as needed for anxiety (insomnia).  Dispense: 90 tablet; Refill: 0  7. BMI 35.0-35.9,adult Encouraged healthy eating choices and regular exercise.   Fara Chute, PA-C Physician Assistant-Certified [facility]

## 2016-05-31 NOTE — Patient Instructions (Signed)
     IF you received an x-ray today, you will receive an invoice from [facility]. Please contact [facility] at [phone] with questions or concerns regarding your invoice.   IF you received labwork today, you will receive an invoice from [facility]. Please contact [facility] at [phone] with questions or concerns regarding your invoice.   Our billing staff will not be able to assist you with questions regarding bills from these companies.  You will be contacted with the lab results as soon as they are available. The fastest way to get your results is to activate your My Chart account. Instructions are located on the last page of this paperwork. If you have not heard from us regarding the results in 2 weeks, please contact this office.      

## 2016-06-01 LAB — HEMOGLOBIN A1C
Hgb A1c MFr Bld: 8.1 % — ABNORMAL HIGH (ref <5.7)
Mean Plasma Glucose: 186 mg/dL

## 2016-06-01 LAB — MICROALBUMIN, URINE: Microalb, Ur: 1.5 mg/dL

## 2016-06-03 MED ORDER — FLUCONAZOLE 150 MG PO TABS: ORAL_TABLET | ORAL | Status: DC

## 2016-06-03 NOTE — Telephone Encounter (Signed)
Sent in Rx and notified pt through MyChart since our phones are down.

## 2016-06-09 ENCOUNTER — Encounter: Payer: Self-pay | Admitting: Physician Assistant

## 2016-06-09 ENCOUNTER — Telehealth: Payer: Self-pay

## 2016-06-09 NOTE — Telephone Encounter (Signed)
Per fax, needed clarification tablets or capsules - Venlafaxine ER 37.5mg . Chelle signed to change from tablets (non-formulary) to capsules. [facility]

## 2016-06-24 ENCOUNTER — Encounter: Payer: Self-pay | Admitting: Physician Assistant

## 2016-07-08 ENCOUNTER — Ambulatory Visit: Payer: Commercial Managed Care - HMO | Admitting: Physician Assistant

## 2016-07-15 ENCOUNTER — Other Ambulatory Visit: Payer: Self-pay | Admitting: Physician Assistant

## 2016-07-15 DIAGNOSIS — E1129 Type 2 diabetes mellitus with other diabetic kidney complication: Secondary | ICD-10-CM

## 2016-07-15 DIAGNOSIS — R809 Proteinuria, unspecified: Principal | ICD-10-CM

## 2016-07-15 DIAGNOSIS — I10 Essential (primary) hypertension: Secondary | ICD-10-CM

## 2016-07-16 ENCOUNTER — Other Ambulatory Visit: Payer: Self-pay | Admitting: Physician Assistant

## 2016-07-16 DIAGNOSIS — R809 Proteinuria, unspecified: Secondary | ICD-10-CM

## 2016-07-16 DIAGNOSIS — E1129 Type 2 diabetes mellitus with other diabetic kidney complication: Secondary | ICD-10-CM

## 2016-07-16 DIAGNOSIS — I10 Essential (primary) hypertension: Secondary | ICD-10-CM

## 2016-09-11 ENCOUNTER — Encounter: Payer: Self-pay | Admitting: Physician Assistant

## 2016-09-12 MED ORDER — FLUCONAZOLE 150 MG PO TABS: ORAL_TABLET | ORAL | 0 refills | Status: DC

## 2016-09-12 MED ORDER — CIPROFLOXACIN HCL 250 MG PO TABS: 250.0000 mg | ORAL_TABLET | Freq: Two times a day (BID) | ORAL | 0 refills | Status: DC

## 2016-09-12 NOTE — Telephone Encounter (Signed)
Chelle, I see that in pt's hx you have Rxd cipro for her multi times for recurring Sxs, so wasn't sure if you would want me to deny it as we normally would Abx.

## 2016-11-05 ENCOUNTER — Emergency Department (HOSPITAL_COMMUNITY)
Admission: EM | Admit: 2016-11-05 | Discharge: 2016-11-05 | Disposition: A | Discharge status: Home / Self Care | Payer: Commercial Managed Care - HMO | Attending: Emergency Medicine | Admitting: Emergency Medicine

## 2016-11-05 ENCOUNTER — Encounter (HOSPITAL_COMMUNITY): Payer: Self-pay | Admitting: *Deleted

## 2016-11-05 DIAGNOSIS — F419 Anxiety disorder, unspecified: Secondary | ICD-10-CM | POA: Diagnosis not present

## 2016-11-05 DIAGNOSIS — I10 Essential (primary) hypertension: Secondary | ICD-10-CM | POA: Diagnosis not present

## 2016-11-05 DIAGNOSIS — E119 Type 2 diabetes mellitus without complications: Secondary | ICD-10-CM | POA: Diagnosis not present

## 2016-11-05 DIAGNOSIS — R0981 Nasal congestion: Secondary | ICD-10-CM | POA: Diagnosis not present

## 2016-11-05 DIAGNOSIS — Z7984 Long term (current) use of oral hypoglycemic drugs: Secondary | ICD-10-CM | POA: Insufficient documentation

## 2016-11-05 DIAGNOSIS — I158 Other secondary hypertension: Secondary | ICD-10-CM

## 2016-11-05 DIAGNOSIS — Z79899 Other long term (current) drug therapy: Secondary | ICD-10-CM | POA: Diagnosis not present

## 2016-11-05 DIAGNOSIS — R002 Palpitations: Secondary | ICD-10-CM | POA: Diagnosis present

## 2016-11-05 DIAGNOSIS — G609 Hereditary and idiopathic neuropathy, unspecified: Secondary | ICD-10-CM | POA: Diagnosis not present

## 2016-11-05 LAB — URINALYSIS, ROUTINE W REFLEX MICROSCOPIC
Bilirubin Urine: NEGATIVE
Glucose, UA: NEGATIVE mg/dL
Hgb urine dipstick: NEGATIVE
Ketones, ur: NEGATIVE mg/dL
Leukocytes, UA: NEGATIVE
Nitrite: NEGATIVE
Protein, ur: NEGATIVE mg/dL
Specific Gravity, Urine: 1.006 (ref 1.005–1.030)
pH: 6 (ref 5.0–8.0)

## 2016-11-05 LAB — CBC WITH DIFFERENTIAL/PLATELET
Basophils Absolute: 0 10*3/uL (ref 0.0–0.1)
Basophils Relative: 1 %
Eosinophils Absolute: 0.1 10*3/uL (ref 0.0–0.7)
Eosinophils Relative: 1 %
HCT: 38.1 % (ref 36.0–46.0)
Hemoglobin: 12.5 g/dL (ref 12.0–15.0)
Lymphocytes Relative: 36 %
Lymphs Abs: 2.3 10*3/uL (ref 0.7–4.0)
MCH: 25.1 pg — ABNORMAL LOW (ref 26.0–34.0)
MCHC: 32.8 g/dL (ref 30.0–36.0)
MCV: 76.4 fL — ABNORMAL LOW (ref 78.0–100.0)
Monocytes Absolute: 0.4 10*3/uL (ref 0.1–1.0)
Monocytes Relative: 6 %
Neutro Abs: 3.6 10*3/uL (ref 1.7–7.7)
Neutrophils Relative %: 56 %
Platelets: 334 10*3/uL (ref 150–400)
RBC: 4.99 MIL/uL (ref 3.87–5.11)
RDW: 15.5 % (ref 11.5–15.5)
WBC: 6.4 10*3/uL (ref 4.0–10.5)

## 2016-11-05 LAB — COMPREHENSIVE METABOLIC PANEL
ALT: 56 U/L — ABNORMAL HIGH (ref 14–54)
AST: 55 U/L — ABNORMAL HIGH (ref 15–41)
Albumin: 4.6 g/dL (ref 3.5–5.0)
Alkaline Phosphatase: 38 U/L (ref 38–126)
Anion gap: 13 (ref 5–15)
BUN: 9 mg/dL (ref 6–20)
CO2: 23 mmol/L (ref 22–32)
Calcium: 9.6 mg/dL (ref 8.9–10.3)
Chloride: 100 mmol/L — ABNORMAL LOW (ref 101–111)
Creatinine, Ser: 0.76 mg/dL (ref 0.44–1.00)
GFR calc Af Amer: >60 mL/min (ref >60)
GFR calc non Af Amer: >60 mL/min (ref >60)
Glucose, Bld: 145 mg/dL — ABNORMAL HIGH (ref 65–99)
Potassium: 3.6 mmol/L (ref 3.5–5.1)
Sodium: 136 mmol/L (ref 135–145)
Total Bilirubin: 0.7 mg/dL (ref 0.3–1.2)
Total Protein: 8.5 g/dL — ABNORMAL HIGH (ref 6.5–8.1)

## 2016-11-05 LAB — RAPID URINE DRUG SCREEN, HOSP PERFORMED
Amphetamines: NOT DETECTED
Barbiturates: NOT DETECTED
Benzodiazepines: NOT DETECTED
Cocaine: NOT DETECTED
Opiates: NOT DETECTED
Tetrahydrocannabinol: NOT DETECTED

## 2016-11-05 LAB — CBG MONITORING, ED: Glucose-Capillary: 157 mg/dL — ABNORMAL HIGH (ref 65–99)

## 2016-11-05 LAB — T4, FREE: Free T4: 1.02 ng/dL (ref 0.61–1.12)

## 2016-11-05 LAB — ETHANOL: Alcohol, Ethyl (B): <5 mg/dL (ref <5)

## 2016-11-05 LAB — TSH: TSH: 3.222 u[IU]/mL (ref 0.350–4.500)

## 2016-11-05 MED ORDER — ALPRAZOLAM 0.25 MG PO TABS: 0.2500 mg | ORAL_TABLET | Freq: Every evening | ORAL | 0 refills | Status: DC | PRN

## 2016-11-05 MED ORDER — CLONIDINE HCL 0.1 MG PO TABS
0.2000 mg | ORAL_TABLET | Freq: Once | ORAL | Status: AC
  Administered 2016-11-05: 0.2 mg via ORAL
  Filled 2016-11-05: qty 2

## 2016-11-05 MED ORDER — ALPRAZOLAM 0.25 MG PO TABS
0.2500 mg | ORAL_TABLET | Freq: Once | ORAL | Status: AC
  Administered 2016-11-05: 0.25 mg via ORAL
  Filled 2016-11-05: qty 1

## 2016-11-05 MED ORDER — LISINOPRIL 10 MG PO TABS
10.0000 mg | ORAL_TABLET | Freq: Once | ORAL | Status: AC
  Administered 2016-11-05: 10 mg via ORAL
  Filled 2016-11-05: qty 1

## 2016-11-05 NOTE — ED Triage Notes (Signed)
Pt presents to ED with c/o swollen feet and feeling jittery. Pt hypertensive and tachycardic in triage, denies pain

## 2016-11-05 NOTE — ED Notes (Signed)
Bed: WA20 Expected date:  Expected time:  Means of arrival:  Comments: 

## 2016-11-05 NOTE — ED Notes (Addendum)
Pt denies chest pain, SOB, advises feet have been hurting for several weeks and she started feeling jittery this morning.

## 2016-11-05 NOTE — ED Provider Notes (Signed)
[facility] DEPT Provider Note   CSN: [id number] Arrival date & time: 11/05/16  1053     History   Chief Complaint Chief Complaint  Patient presents with  . Foot Swelling  . Jitters    HPI Frances Cabrera is a 70 y.o. female.  HPI Patient presents with several complaints. States that she is having a "jittery" feelings since yesterday. She's had some mild palpitations but denies any shortness of breath or chest pain. Denies any nausea or vomiting. States that she sometimes misses taking her medication. Has not taken her blood pressure medication today. Last dose was yesterday. She states she is increasingly stressed trying to care for her husband who recently had spinal surgery. She is not sleeping well and is tearful. Denies alcohol or drug use. Patient also complaining of bilateral feet paresthesias. This been present for the last week. Describes as "pins and needles" to the plantar surface of both feet just over the distal first metatarsal. No trauma. No back pain. No weakness. Past Medical History:  Diagnosis Date  . Diabetes mellitus   . Elevated LFTs   . Hypertension   . Menopause   . Microalbuminuria   . Obesity   . Recurrent UTI    post-coital    Patient Active Problem List   Diagnosis Date Noted  . 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 HTN (hypertension) 05/30/2012  . Microalbuminuria   . Recurrent UTI   . BMI 35.0-35.9,adult   . Elevated LFTs     Past Surgical History:  Procedure Laterality Date  . CHOLECYSTECTOMY  2000  . TUBAL LIGATION      OB History    No data available       Home Medications    Prior to Admission medications   Medication Sig Start Date End Date Taking? Authorizing Provider  acetaminophen (TYLENOL) 500 MG tablet Take 500 mg by mouth every 6 (six) hours as needed for moderate pain or headache. Reported on 05/31/2016   Yes Historical Provider, MD  cloNIDine (CATAPRES) 0.2 MG  tablet TAKE 1 TABLET (0.2 MG TOTAL) BY MOUTH DAILY. 07/16/16  Yes Chelle Jeffery, PA-C  lisinopril (PRINIVIL,ZESTRIL) 20 MG tablet Take 1 tablet (20 mg total) by mouth daily. Patient taking differently: Take 10 mg by mouth daily.  05/31/16  Yes Chelle Jeffery, PA-C  metFORMIN (GLUCOPHAGE) 1000 MG tablet TAKE 1 TABLET (1,000 MG TOTAL) BY MOUTH 2 (TWO) TIMES DAILY WITH A MEAL. 07/16/16  Yes Chelle Jeffery, PA-C  Multiple Vitamins-Minerals (MULTIVITAMIN WITH MINERALS) tablet Take 1 tablet by mouth at bedtime. Reported on 05/31/2016   Yes Historical Provider, MD  triamterene-hydrochlorothiazide (DYAZIDE) 37.5-25 MG capsule TAKE 1 CAPSULE EVERY DAY 07/16/16  Yes Chelle Jeffery, PA-C  verapamil (CALAN-SR) 240 MG CR tablet Take 1 tablet (240 mg total) by mouth 2 (two) times daily. 11/28/15  Yes Chelle Jeffery, PA-C  atorvastatin (LIPITOR) 20 MG tablet TAKE 1 TABLET EVERY DAY 07/16/16   Chelle Jeffery, PA-C  blood glucose meter kit and supplies KIT Use to test blood sugar once daily. E11.9 12/31/15   Chelle Jeffery, PA-C  Blood Glucose Monitoring Suppl (ACCU-CHEK AVIVA) device Use to test blood sugar once daily. E11.9 02/08/16 02/07/17  Chelle Jeffery, PA-C  Blood Pressure Monitoring (B-D ASSURE BPM/AUTO WRIST CUFF) MISC Use to measure home blood pressure. 09/05/15   Chelle Jeffery, PA-C  ciprofloxacin (CIPRO) 250 MG tablet Take 1 tablet (250 mg total) by mouth 2 (two) times daily. Patient not taking: Reported on  11/05/2016 09/12/16   Chelle Jeffery, PA-C  fluconazole (DIFLUCAN) 150 MG tablet Take 1 tablet by mouth once. May repeat after finishing antibiotic if needed. Patient not taking: Reported on 11/05/2016 09/12/16   Harrison Mons, PA-C  glucose blood test strip Use to test blood sugar once daily. E11.9 02/08/16   Chelle Jeffery, PA-C  hydrOXYzine (ATARAX/VISTARIL) 25 MG tablet Take 1-3 tablets (25-75 mg total) by mouth at bedtime as needed for anxiety (insomnia). Patient not taking: Reported on 11/05/2016 05/31/16    Harrison Mons, PA-C  Lancet Device MISC Use for home glucose monitoring 02/08/16   Harrison Mons, PA-C  Lancets [facility] ULTRASOFT) lancets Use to test blood sugar once daily. E11.9 12/31/15   Chelle Jeffery, PA-C  Venlafaxine HCl 37.5 MG TB24 Take one each morning x 1 week, then increase to 2 each morning. Patient not taking: Reported on 11/05/2016 05/31/16   Harrison Mons, PA-C  zoster vaccine live, PF, (ZOSTAVAX) 38756 UNT/0.65ML injection Inject 19,400 Units into the skin once. 09/05/15   Harrison Mons, PA-C    Family History Family History  Problem Relation Age of Onset  . Diabetes Mother   . Hypertension Mother   . Kidney failure Mother   . Diabetes Father   . Diabetes Brother   . Diabetes Brother     Social History Social History  Substance Use Topics  . Smoking status: Never Smoker  . Smokeless tobacco: Never Used  . Alcohol use No     Allergies   Penicillins and Shrimp [shellfish allergy]   Review of Systems Review of Systems  Constitutional: Negative for chills and fever.  HENT: Positive for congestion, facial swelling, rhinorrhea and sinus pressure. Negative for ear discharge, ear pain, sore throat and trouble swallowing.   Eyes: Negative for photophobia and visual disturbance.  Respiratory: Negative for shortness of breath.   Cardiovascular: Negative for chest pain, palpitations and leg swelling.  Gastrointestinal: Negative for abdominal pain, diarrhea, nausea and vomiting.  Genitourinary: Negative for difficulty urinating, dysuria, flank pain, frequency and hematuria.  Musculoskeletal: Negative for back pain, myalgias, neck pain and neck stiffness.  Skin: Negative for rash and wound.  Neurological: Positive for tremors. Negative for dizziness, syncope, speech difficulty, weakness, light-headedness, numbness and headaches.  Psychiatric/Behavioral: Positive for sleep disturbance. The patient is nervous/anxious.   All other systems reviewed and are  negative.    Physical Exam Updated Vital Signs BP 148/73 (BP Location: Left Arm)   Pulse 97   Temp 98.3 F (36.8 C)   Resp 19   SpO2 97%   Physical Exam  Constitutional: She is oriented to person, place, and time. She appears well-developed and well-nourished. No distress.  Tearful  HENT:  Head: Normocephalic and atraumatic.  Mouth/Throat: Oropharynx is clear and moist. No oropharyngeal exudate.  Bilateral nasal mucosal edema. Mild bulging of the left TM. No definite tenderness over the sinuses. Questionable small amount of left-sided facial swelling  Eyes: EOM are normal. Pupils are equal, round, and reactive to light.  Neck: Normal range of motion. Neck supple. No JVD present.  Cardiovascular: Normal rate and regular rhythm.  Exam reveals no gallop and no friction rub.   No murmur heard. Pulmonary/Chest: Effort normal and breath sounds normal. No respiratory distress. She has no wheezes. She has no rales. She exhibits no tenderness.  Abdominal: Soft. Bowel sounds are normal. There is no tenderness. There is no rebound and no guarding.  Musculoskeletal: Normal range of motion. She exhibits no edema or tenderness.  No lower  extremity swelling, asymmetry or tenderness. 2+ dorsalis and posterior tibial pulses. No midline thoracic or lumbar tenderness.  Lymphadenopathy:    She has no cervical adenopathy.  Neurological: She is alert and oriented to person, place, and time.  Patient is alert and oriented x3 with clear, goal oriented speech. Patient has 5/5 motor in all extremities. Sensation is intact to light touch. Subjective paresthesias to the plantar surface of bilateral feet.  Bilateral finger-to-nose is normal with no signs of dysmetria. Patient has a normal gait and walks without assistance.  Skin: Skin is warm and dry. Capillary refill takes less than 2 seconds. No rash noted. No erythema.  Psychiatric: Her behavior is normal.  Nursing note and vitals reviewed.    ED  Treatments / Results  Labs (all labs ordered are listed, but only abnormal results are displayed) Labs Reviewed  CBC WITH DIFFERENTIAL/PLATELET - Abnormal; Notable for the following:       Result Value   MCV 76.4 (*)    MCH 25.1 (*)    All other components within normal limits  COMPREHENSIVE METABOLIC PANEL - Abnormal; Notable for the following:    Chloride 100 (*)    Glucose, Bld 145 (*)    Total Protein 8.5 (*)    AST 55 (*)    ALT 56 (*)    All other components within normal limits  CBG MONITORING, ED - Abnormal; Notable for the following:    Glucose-Capillary 157 (*)    All other components within normal limits  URINALYSIS, ROUTINE W REFLEX MICROSCOPIC (NOT AT [facility])  ETHANOL  RAPID URINE DRUG SCREEN, HOSP PERFORMED  TSH  T4, FREE    EKG  EKG Interpretation  Date/Time:  Wednesday November 05 2016 11:12:23 EST Ventricular Rate:  118 PR Interval:    QRS Duration: 98 QT Interval:  315 QTC Calculation: 442 R Axis:   65 Text Interpretation:  Sinus tachycardia Multiple ventricular premature complexes Low voltage, precordial leads Borderline repolarization abnormality Confirmed by Lita Mains  MD, Xinyi Batton ([id number]) on 11/05/2016 4:06:39 PM       Radiology No results found.  Procedures Procedures (including critical care time)  Medications Ordered in ED Medications  cloNIDine (CATAPRES) tablet 0.2 mg (0.2 mg Oral Given 11/05/16 1228)  ALPRAZolam (XANAX) tablet 0.25 mg (0.25 mg Oral Given 11/05/16 1228)  lisinopril (PRINIVIL,ZESTRIL) tablet 10 mg (10 mg Oral Given 11/05/16 1321)     Initial Impression / Assessment and Plan / ED Course  I have reviewed the triage vital signs and the nursing notes.  Pertinent labs & imaging results that were available during my care of the patient were reviewed by me and considered in my medical decision making (see chart for details).  Clinical Course     Patient states she is feeling much better. Tachycardia and hypertension  resolved. Workup essentially normal. Suspect some rebound hypertension due to missing her blood pressure medication as well as anxiety. States that the Xanax significantly improved her anxiety. She was able to get a nap. We will discharge home with prescription for a few tabs of the Xanax.  Patient's paresthesias are consistent with peripheral neuropathy. Likely due to her diabetes. Just as nasal congestion and some sinus pressure. Advised starting over-the-counter Claritin. She will follow-up with her primary physician. She understands need to take her blood pressure medication as previously prescribed in the past. Return precautions have been given.  Final Clinical Impressions(s) / ED Diagnoses   Final diagnoses:  Rebound hypertension  Anxiety  Idiopathic peripheral  neuropathy  Nasal sinus congestion    New Prescriptions New Prescriptions   No medications on file     Julianne Rice, MD 11/05/16 [phone]

## 2016-11-18 ENCOUNTER — Ambulatory Visit (INDEPENDENT_AMBULATORY_CARE_PROVIDER_SITE_OTHER): Payer: Commercial Managed Care - HMO | Admitting: Physician Assistant

## 2016-11-18 ENCOUNTER — Encounter: Payer: Self-pay | Admitting: Physician Assistant

## 2016-11-18 VITALS — BP 180/68 | HR 118 | Temp 98.7°F | Resp 16 | Ht 71.0 in | Wt 252.0 lb

## 2016-11-18 DIAGNOSIS — Z23 Encounter for immunization: Secondary | ICD-10-CM

## 2016-11-18 DIAGNOSIS — I10 Essential (primary) hypertension: Secondary | ICD-10-CM

## 2016-11-18 DIAGNOSIS — F430 Acute stress reaction: Secondary | ICD-10-CM | POA: Diagnosis not present

## 2016-11-18 DIAGNOSIS — E1169 Type 2 diabetes mellitus with other specified complication: Secondary | ICD-10-CM

## 2016-11-18 DIAGNOSIS — R000 Tachycardia, unspecified: Secondary | ICD-10-CM | POA: Diagnosis not present

## 2016-11-18 DIAGNOSIS — E669 Obesity, unspecified: Secondary | ICD-10-CM

## 2016-11-18 DIAGNOSIS — F329 Major depressive disorder, single episode, unspecified: Secondary | ICD-10-CM | POA: Diagnosis not present

## 2016-11-18 DIAGNOSIS — F32A Depression, unspecified: Secondary | ICD-10-CM

## 2016-11-18 NOTE — Progress Notes (Signed)
Patient ID: Frances Cabrera, female    DOB: 1946-07-30, 70 y.o.   MRN: [id number]  PCP: Harrison Mons, PA-C  Chief Complaint  Patient presents with  . Follow-up    Hospital  . Immunizations    flu, tdap     Subjective:   Presents for evaluation of HTN following an ED visit, and for flu and Tdap vaccinations.   She presented to the [facility] on 11/05/2016 with 1 days of "jittery" feelings. Associated with some mild palpitations, but no CP or SOB. She related stress due to taking care of her husband who had recently had spinal surgery. She wasn't sleeping well.  She also reported paresthesias in both feet x 1 week. She had missed some doses of her medications.  On exam BP was 210/92, pulse 135. She was tearful, had coryza, mild bulging of the LEFT TM. EKG revealed sinus tachycardia with multiple premature ventricular complexes. It was thought her symptoms were due to rebound HTN from being off her meds and stress. She was advised to resume her medications and received a few doses of alprazolam.  Still has the jitters. Also has foot pain. Acetaminophen isn't helping. "If I can get this hurting [resolved], I think I can do some things."  She has been separated from her husband off and on x 7 years. While she was in [place] taking care of her mother's estate, he joined her, uninvited. They tried to make a go of their relationship, but he's been seeking out other partners, and she left. His back surgery was 2 years ago. He currently ambulates with a cane.  I had prescribed venlafaxine in June, along with hydroxyzine at HS. She didn't take the venlafaxine due to cost, but has filled it now.  Has lost 5 lbs. Has not seen an eye specialist in the past year due to cost.  Is only taking 1/2 tablet of the lisinopril. She couldn't tolerate the whole tablet. She believes it was making her sleepy.     Review of Systems As above. No CP, SOB, HA, dizziness. No nausea, vomiting,  diarrhea.    Patient Active Problem List   Diagnosis Date Noted  . 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 HTN (hypertension) 05/30/2012  . Microalbuminuria   . Recurrent UTI   . BMI 35.0-35.9,adult   . Elevated LFTs      Prior to Admission medications   Medication Sig Start Date End Date Taking? Authorizing Provider  acetaminophen (TYLENOL) 500 MG tablet Take 500 mg by mouth every 6 (six) hours as needed for moderate pain or headache. Reported on 05/31/2016   Yes Historical Provider, MD  ALPRAZolam (XANAX) 0.25 MG tablet Take 1 tablet (0.25 mg total) by mouth at bedtime as needed for anxiety or sleep. 11/05/16  Yes Julianne Rice, MD  atorvastatin (LIPITOR) 20 MG tablet TAKE 1 TABLET EVERY DAY 07/16/16  Yes Charvis Lightner, PA-C  blood glucose meter kit and supplies KIT Use to test blood sugar once daily. E11.9 12/31/15  Yes Fredricka Kohrs, PA-C  Blood Glucose Monitoring Suppl (ACCU-CHEK AVIVA) device Use to test blood sugar once daily. E11.9 02/08/16 02/07/17 Yes Rahsaan Weakland, PA-C  Blood Pressure Monitoring (B-D ASSURE BPM/AUTO WRIST CUFF) MISC Use to measure home blood pressure. 09/05/15  Yes Riah Kehoe, PA-C  ciprofloxacin (CIPRO) 250 MG tablet Take 1 tablet (250 mg total) by mouth 2 (two) times daily. 09/12/16  Yes Wanda Cellucci, PA-C  cloNIDine (CATAPRES) 0.2 MG tablet TAKE  1 TABLET (0.2 MG TOTAL) BY MOUTH DAILY. 07/16/16  Yes Rodriquez Thorner, PA-C  fluconazole (DIFLUCAN) 150 MG tablet Take 1 tablet by mouth once. May repeat after finishing antibiotic if needed. 09/12/16  Yes Romel Dumond, PA-C  glucose blood test strip Use to test blood sugar once daily. E11.9 02/08/16  Yes Luree Palla, PA-C  hydrOXYzine (ATARAX/VISTARIL) 25 MG tablet Take 1-3 tablets (25-75 mg total) by mouth at bedtime as needed for anxiety (insomnia). 05/31/16  Yes Harrison Mons, PA-C  Lancet Device MISC Use for home glucose monitoring 02/08/16  Yes Palak Tercero, PA-C    Lancets (ONETOUCH ULTRASOFT) lancets Use to test blood sugar once daily. E11.9 12/31/15  Yes Aleighna Wojtas, PA-C  lisinopril (PRINIVIL,ZESTRIL) 20 MG tablet Take 1 tablet (20 mg total) by mouth daily. Patient taking differently: Take 10 mg by mouth daily.  05/31/16  Yes Huriel Matt, PA-C  metFORMIN (GLUCOPHAGE) 1000 MG tablet TAKE 1 TABLET (1,000 MG TOTAL) BY MOUTH 2 (TWO) TIMES DAILY WITH A MEAL. 07/16/16  Yes Aleyna Cueva, PA-C  Multiple Vitamins-Minerals (MULTIVITAMIN WITH MINERALS) tablet Take 1 tablet by mouth at bedtime. Reported on 05/31/2016   Yes Historical Provider, MD  triamterene-hydrochlorothiazide (DYAZIDE) 37.5-25 MG capsule TAKE 1 CAPSULE EVERY DAY 07/16/16  Yes Rhylee Nunn, PA-C  Venlafaxine HCl 37.5 MG TB24 Take one each morning x 1 week, then increase to 2 each morning. 05/31/16  Yes Weaver Tweed, PA-C  verapamil (CALAN-SR) 240 MG CR tablet Take 1 tablet (240 mg total) by mouth 2 (two) times daily. 11/28/15  Yes Hawley Pavia, PA-C  zoster vaccine live, PF, (ZOSTAVAX) 62947 UNT/0.65ML injection Inject 19,400 Units into the skin once. 09/05/15  Yes Harrison Mons, PA-C     Allergies  Allergen Reactions  . Penicillins Hives    Has patient had a PCN reaction causing immediate rash, facial/tongue/throat swelling, SOB or lightheadedness with hypotension: Yes Has patient had a PCN reaction causing severe rash involving mucus membranes or skin necrosis: Yes Has patient had a PCN reaction that required hospitalization No Has patient had a PCN reaction occurring within the last 10 years: No If all of the above answers are "NO", then may proceed with Cephalosporin use.   . Shrimp [Shellfish Allergy] Rash       Objective:  Physical Exam  Constitutional: She is oriented to person, place, and time. She appears well-developed and well-nourished. She is active and cooperative. No distress.  BP (!) 180/68 (BP Location: Right Arm, Patient Position: Sitting, Cuff Size: Large)    Pulse (!) 118   Temp 98.7 F (37.1 C) (Oral)   Resp 16   Ht _0  (1.803 m)   Wt 252 lb (114.3 kg)   SpO2 98%   BMI 35.15 kg/m   HENT:  Head: Normocephalic and atraumatic.  Right Ear: Hearing normal.  Left Ear: Hearing normal.  Eyes: Conjunctivae are normal. No scleral icterus.  Neck: Normal range of motion. Neck supple. No thyromegaly present.  Cardiovascular: Normal rate, regular rhythm and normal heart sounds.   Pulses:      Radial pulses are 2+ on the right side, and 2+ on the left side.  Pulmonary/Chest: Effort normal and breath sounds normal.  Musculoskeletal:       Right ankle: Normal.       Left ankle: Normal.       Right foot: Normal.       Left foot: Normal.  Lymphadenopathy:       Head (right side): No tonsillar, no preauricular,  no posterior auricular and no occipital adenopathy present.       Head (left side): No tonsillar, no preauricular, no posterior auricular and no occipital adenopathy present.    She has no cervical adenopathy.       Right: No supraclavicular adenopathy present.       Left: No supraclavicular adenopathy present.  Neurological: She is alert and oriented to person, place, and time. No sensory deficit.  Skin: Skin is warm, dry and intact. No rash noted. No cyanosis or erythema. Nails show no clubbing.  Psychiatric: She has a normal mood and affect. Her speech is normal and behavior is normal.           Assessment & Plan:   1. Diabetes mellitus type 2 in obese [facility]) Await labs. Adjust regimen as indicated by results. - Hemoglobin A1c - Lipid panel - HM Diabetes Eye Exam - HM Diabetes Foot Exam  2. Tachycardia Suspect this is due to uncontrolled anxiety/stress.   3. Essential hypertension Advised that she take the whole tablet of lisinopril, but take it at bedtime, since she thinks it makes her sleepy. If BP remains elevated, would consider beta blocker if depression is controlled.  4. Needs flu shot - Flu Vaccine QUAD 36+ mos  IM  5. Depression, unspecified depression type 6. Reaction, situational, acute, to stress Lisinopril at HS may help her sleep. Encouraged her to start the venlafaxine. I think that her symptoms, including tachycardia, will improve with management of stress/depression/anxiety.  7. Need for TD vaccine - Td vaccine greater than or equal to 7yo preservative free IM   Return in about 4 weeks (around 12/16/2016).     Fara Chute, PA-C Physician Assistant-Certified [facility]

## 2016-11-18 NOTE — Patient Instructions (Addendum)
1. I encourage you to start the venlafaxine. Take one each morning. You may feel irritable for 2-3 weeks, but it will go away. If it's too high, let me know and I'll send in something else.  2. Take the lisinopril 20 mg at bedtime, since it makes you sleepy.   3. Try to schedule an eye exam.    IF you received an x-ray today, you will receive an invoice from [facility]. Please contact [facility] at [phone] with questions or concerns regarding your invoice.   IF you received labwork today, you will receive an invoice from [facility]. Please contact [facility] at [phone] with questions or concerns regarding your invoice.   Our billing staff will not be able to assist you with questions regarding bills from these companies.  You will be contacted with the lab results as soon as they are available. The fastest way to get your results is to activate your My Chart account. Instructions are located on the last page of this paperwork. If you have not heard from us regarding the results in 2 weeks, please contact this office.

## 2016-11-19 LAB — LIPID PANEL
Chol/HDL Ratio: 3.3 ratio units (ref 0.0–4.4)
Cholesterol, Total: 187 mg/dL (ref 100–199)
HDL: 56 mg/dL (ref >39)
LDL Calculated: 103 mg/dL — ABNORMAL HIGH (ref 0–99)
Triglycerides: 142 mg/dL (ref 0–149)
VLDL Cholesterol Cal: 28 mg/dL (ref 5–40)

## 2016-11-19 LAB — HEMOGLOBIN A1C
Est. average glucose Bld gHb Est-mCnc: 154 mg/dL
Hgb A1c MFr Bld: 7 % — ABNORMAL HIGH (ref 4.8–5.6)

## 2016-11-25 ENCOUNTER — Encounter: Payer: Self-pay | Admitting: Physician Assistant

## 2016-11-25 DIAGNOSIS — F329 Major depressive disorder, single episode, unspecified: Secondary | ICD-10-CM

## 2016-11-25 DIAGNOSIS — F32A Depression, unspecified: Secondary | ICD-10-CM

## 2016-11-25 MED ORDER — VENLAFAXINE HCL ER 37.5 MG PO TB24: ORAL_TABLET | ORAL | 3 refills | Status: DC

## 2016-11-26 ENCOUNTER — Emergency Department (HOSPITAL_COMMUNITY): Payer: Commercial Managed Care - HMO

## 2016-11-26 ENCOUNTER — Emergency Department (HOSPITAL_COMMUNITY)
Admission: EM | Admit: 2016-11-26 | Discharge: 2016-11-26 | Disposition: A | Discharge status: Home / Self Care | Payer: Commercial Managed Care - HMO | Attending: Emergency Medicine | Admitting: Emergency Medicine

## 2016-11-26 ENCOUNTER — Encounter (HOSPITAL_COMMUNITY): Payer: Self-pay

## 2016-11-26 DIAGNOSIS — R0789 Other chest pain: Secondary | ICD-10-CM | POA: Insufficient documentation

## 2016-11-26 DIAGNOSIS — E119 Type 2 diabetes mellitus without complications: Secondary | ICD-10-CM | POA: Insufficient documentation

## 2016-11-26 DIAGNOSIS — Z79899 Other long term (current) drug therapy: Secondary | ICD-10-CM | POA: Diagnosis not present

## 2016-11-26 DIAGNOSIS — R509 Fever, unspecified: Secondary | ICD-10-CM | POA: Diagnosis not present

## 2016-11-26 DIAGNOSIS — I10 Essential (primary) hypertension: Secondary | ICD-10-CM | POA: Insufficient documentation

## 2016-11-26 DIAGNOSIS — Z7984 Long term (current) use of oral hypoglycemic drugs: Secondary | ICD-10-CM | POA: Insufficient documentation

## 2016-11-26 DIAGNOSIS — R079 Chest pain, unspecified: Secondary | ICD-10-CM

## 2016-11-26 LAB — BASIC METABOLIC PANEL
Anion gap: 13 (ref 5–15)
BUN: 18 mg/dL (ref 6–20)
CO2: 21 mmol/L — ABNORMAL LOW (ref 22–32)
Calcium: 9.1 mg/dL (ref 8.9–10.3)
Chloride: 99 mmol/L — ABNORMAL LOW (ref 101–111)
Creatinine, Ser: 0.97 mg/dL (ref 0.44–1.00)
GFR calc Af Amer: >60 mL/min (ref >60)
GFR calc non Af Amer: 59 mL/min — ABNORMAL LOW (ref >60)
Glucose, Bld: 163 mg/dL — ABNORMAL HIGH (ref 65–99)
Potassium: 4 mmol/L (ref 3.5–5.1)
Sodium: 133 mmol/L — ABNORMAL LOW (ref 135–145)

## 2016-11-26 LAB — I-STAT TROPONIN, ED
Troponin i, poc: 0 ng/mL (ref 0.00–0.08)
Troponin i, poc: 0 ng/mL (ref 0.00–0.08)

## 2016-11-26 LAB — CBC
HCT: 38.8 % (ref 36.0–46.0)
Hemoglobin: 12.9 g/dL (ref 12.0–15.0)
MCH: 25.2 pg — ABNORMAL LOW (ref 26.0–34.0)
MCHC: 33.2 g/dL (ref 30.0–36.0)
MCV: 75.9 fL — ABNORMAL LOW (ref 78.0–100.0)
Platelets: 287 10*3/uL (ref 150–400)
RBC: 5.11 MIL/uL (ref 3.87–5.11)
RDW: 15.6 % — ABNORMAL HIGH (ref 11.5–15.5)
WBC: 10.3 10*3/uL (ref 4.0–10.5)

## 2016-11-26 LAB — HEPATIC FUNCTION PANEL
ALT: 44 U/L (ref 14–54)
AST: 48 U/L — ABNORMAL HIGH (ref 15–41)
Albumin: 4 g/dL (ref 3.5–5.0)
Alkaline Phosphatase: 42 U/L (ref 38–126)
Bilirubin, Direct: 0.1 mg/dL (ref 0.1–0.5)
Indirect Bilirubin: 0.5 mg/dL (ref 0.3–0.9)
Total Bilirubin: 0.6 mg/dL (ref 0.3–1.2)
Total Protein: 8 g/dL (ref 6.5–8.1)

## 2016-11-26 LAB — LIPASE, BLOOD: Lipase: 35 U/L (ref 11–51)

## 2016-11-26 LAB — D-DIMER, QUANTITATIVE: D-Dimer, Quant: 0.43 ug/mL-FEU (ref 0.00–0.50)

## 2016-11-26 MED ORDER — CLONIDINE HCL 0.1 MG PO TABS
0.2000 mg | ORAL_TABLET | Freq: Once | ORAL | Status: AC
  Administered 2016-11-26: 0.2 mg via ORAL
  Filled 2016-11-26: qty 2

## 2016-11-26 MED ORDER — PANTOPRAZOLE SODIUM 20 MG PO TBEC: 20.0000 mg | DELAYED_RELEASE_TABLET | Freq: Two times a day (BID) | ORAL | 0 refills | Status: DC

## 2016-11-26 MED ORDER — SODIUM CHLORIDE 0.9 % IV BOLUS (SEPSIS)
1000.0000 mL | Freq: Once | INTRAVENOUS | Status: AC
  Administered 2016-11-26: 1000 mL via INTRAVENOUS

## 2016-11-26 NOTE — ED Notes (Signed)
Provider allowed patient to take her personal 500mg  Tylenol for fever.

## 2016-11-26 NOTE — ED Notes (Signed)
ED Provider at bedside. 

## 2016-11-26 NOTE — ED Notes (Signed)
Attempted blood draw x2 unsuccessful 

## 2016-11-26 NOTE — ED Notes (Signed)
Informed provider of patients fever. Ambulated patient to the restroom and back to stretcher. Pt tolerated it well.

## 2016-11-26 NOTE — ED Triage Notes (Signed)
Pt presents with c/o chest pain that started around 3 am this morning. Pt reports the pain feels like burning in nature and that she did eat some fried food last night so is concerned that it may be heartburn. Pt is tachycardic at this time at 133 BPM and hypertensive at 183/82. Pt also c/o diarrhea.

## 2016-11-26 NOTE — ED Provider Notes (Signed)
[facility] DEPT Provider Note   CSN: [id number] Arrival date & time: 11/26/16  1057     History   Chief Complaint Chief Complaint  Patient presents with  . Chest Pain    HPI Frances Cabrera is a 70 y.o. female.  HPI  Has been trying to stay away from fried food, last night at [place], then layed down and began to have pain in the middle part of the chest, some radiation to abdomen felt "empty", felt severe nausea but no vomiting. Then drank a pepsi to try to belch but didn't help. Took tums and that didn't help either.  CP has been coming and going, lasts 3-4 minutes, started 3AM last night, has had about 4 episodes with last episode on the way here, feels like a burning pain  Not exertional When took shower did have a little bit of shortness of breath, but improved with rest, thought dyspnea was excitement  Heart rate always high per pt  No hx of DVT/PE/no asymmetric leg swelling or pain  Doctor had her start taking lisinopril at night and anxiety med at night  Hx of stress test a long time ago  Htn, DM No smoking, no fam hx of heart disease   Past Medical History:  Diagnosis Date  . Diabetes mellitus   . Elevated LFTs   . Hypertension   . Menopause   . Microalbuminuria   . Obesity   . Recurrent UTI    post-coital    Patient Active Problem List   Diagnosis Date Noted  . Diabetes mellitus type 2 in obese ([place]) 05/30/2012  . HTN (hypertension) 05/30/2012  . Microalbuminuria   . Recurrent UTI   . BMI 35.0-35.9,adult   . Elevated LFTs     Past Surgical History:  Procedure Laterality Date  . CHOLECYSTECTOMY  2000  . TUBAL LIGATION      OB History    No data available       Home Medications    Prior to Admission medications   Medication Sig Start Date End Date Taking? Authorizing Provider  acetaminophen (TYLENOL) 500 MG tablet Take 500 mg by mouth every 6 (six) hours as needed for moderate pain or headache. Reported on 05/31/2016    Yes Historical Provider, MD  ALPRAZolam (XANAX) 0.25 MG tablet Take 1 tablet (0.25 mg total) by mouth at bedtime as needed for anxiety or sleep. 11/05/16  Yes Julianne Rice, MD  blood glucose meter kit and supplies KIT Use to test blood sugar once daily. E11.9 12/31/15  Yes Chelle Jeffery, PA-C  Blood Glucose Monitoring Suppl (ACCU-CHEK AVIVA) device Use to test blood sugar once daily. E11.9 02/08/16 02/07/17 Yes Chelle Jeffery, PA-C  Blood Pressure Monitoring (B-D ASSURE BPM/AUTO WRIST CUFF) MISC Use to measure home blood pressure. 09/05/15  Yes Chelle Jeffery, PA-C  cloNIDine (CATAPRES) 0.2 MG tablet TAKE 1 TABLET (0.2 MG TOTAL) BY MOUTH DAILY. 07/16/16  Yes Chelle Jeffery, PA-C  glucose blood test strip Use to test blood sugar once daily. E11.9 02/08/16  Yes Harrison Mons, PA-C  Lancet Device MISC Use for home glucose monitoring 02/08/16  Yes Chelle Jeffery, PA-C  Lancets (ONETOUCH ULTRASOFT) lancets Use to test blood sugar once daily. E11.9 12/31/15  Yes Chelle Jeffery, PA-C  lisinopril (PRINIVIL,ZESTRIL) 20 MG tablet Take 1 tablet (20 mg total) by mouth daily. Patient taking differently: Take 20 mg by mouth at bedtime.  05/31/16  Yes Chelle Jeffery, PA-C  metFORMIN (GLUCOPHAGE) 1000 MG tablet TAKE 1 TABLET (1,000 MG  TOTAL) BY MOUTH 2 (TWO) TIMES DAILY WITH A MEAL. 07/16/16  Yes Chelle Jeffery, PA-C  Multiple Vitamins-Minerals (MULTIVITAMIN WITH MINERALS) tablet Take 1 tablet by mouth at bedtime. Reported on 05/31/2016   Yes Historical Provider, MD  triamterene-hydrochlorothiazide (DYAZIDE) 37.5-25 MG capsule TAKE 1 CAPSULE EVERY DAY 07/16/16  Yes Chelle Jeffery, PA-C  verapamil (CALAN-SR) 240 MG CR tablet Take 1 tablet (240 mg total) by mouth 2 (two) times daily. 11/28/15  Yes Chelle Jeffery, PA-C  atorvastatin (LIPITOR) 20 MG tablet TAKE 1 TABLET EVERY DAY Patient not taking: Reported on 11/26/2016 07/16/16   Harrison Mons, PA-C  ciprofloxacin (CIPRO) 250 MG tablet Take 1 tablet (250 mg total) by mouth 2  (two) times daily. Patient not taking: Reported on 11/26/2016 09/12/16   Harrison Mons, PA-C  fluconazole (DIFLUCAN) 150 MG tablet Take 1 tablet by mouth once. May repeat after finishing antibiotic if needed. Patient not taking: Reported on 11/26/2016 09/12/16   Harrison Mons, PA-C  hydrOXYzine (ATARAX/VISTARIL) 25 MG tablet Take 1-3 tablets (25-75 mg total) by mouth at bedtime as needed for anxiety (insomnia). Patient not taking: Reported on 11/26/2016 05/31/16   Harrison Mons, PA-C  pantoprazole (PROTONIX) 20 MG tablet Take 1 tablet (20 mg total) by mouth 2 (two) times daily before a meal. 11/26/16 12/26/16  Gareth Morgan, MD  Venlafaxine HCl 37.5 MG TB24 Take one each morning x 1 week, then increase to 2 each morning. Patient not taking: Reported on 11/26/2016 11/25/16   Harrison Mons, PA-C  zoster vaccine live, PF, (ZOSTAVAX) 16606 UNT/0.65ML injection Inject 19,400 Units into the skin once. Patient not taking: Reported on 11/26/2016 09/05/15   Harrison Mons, PA-C    Family History Family History  Problem Relation Age of Onset  . Diabetes Mother   . Hypertension Mother   . Kidney failure Mother   . Diabetes Father   . Diabetes Brother   . Diabetes Brother     Social History Social History  Substance Use Topics  . Smoking status: Never Smoker  . Smokeless tobacco: Never Used  . Alcohol use No     Allergies   Penicillins and Shrimp [shellfish allergy]   Review of Systems Review of Systems  Constitutional: Negative for fever (however pt developed while in ED).  HENT: Positive for congestion. Negative for ear pain and sore throat.   Eyes: Negative for visual disturbance.  Respiratory: Positive for shortness of breath. Negative for cough.   Cardiovascular: Positive for chest pain.  Gastrointestinal: Positive for nausea. Negative for abdominal pain and vomiting.  Genitourinary: Negative for difficulty urinating.  Musculoskeletal: Negative for back pain and neck pain.    Skin: Negative for rash.  Neurological: Negative for syncope and headaches.     Physical Exam Updated Vital Signs BP 149/70 (BP Location: Right Arm)   Pulse (!) 126   Temp 100.5 F (38.1 C) (Oral)   Resp 19   Ht 5' 11"  (1.803 m)   Wt 252 lb (114.3 kg)   SpO2 97%   BMI 35.15 kg/m   Physical Exam  Constitutional: She is oriented to person, place, and time. She appears well-developed and well-nourished. No distress.  HENT:  Head: Normocephalic and atraumatic.  Mouth/Throat: Oropharynx is clear and moist. No oropharyngeal exudate.  TM normal  Eyes: Conjunctivae and EOM are normal.  Neck: Normal range of motion.  Cardiovascular: Normal rate, regular rhythm, normal heart sounds and intact distal pulses.  Exam reveals no gallop and no friction rub.   No murmur heard.  Pulmonary/Chest: Effort normal and breath sounds normal. No respiratory distress. She has no wheezes. She has no rales.  Abdominal: Soft. She exhibits no distension. There is no tenderness. There is no guarding.  Musculoskeletal: She exhibits no edema or tenderness.  Neurological: She is alert and oriented to person, place, and time.  Skin: Skin is warm and dry. No rash noted. She is not diaphoretic. No erythema.  Nursing note and vitals reviewed.    ED Treatments / Results  Labs (all labs ordered are listed, but only abnormal results are displayed) Labs Reviewed  BASIC METABOLIC PANEL - Abnormal; Notable for the following:       Result Value   Sodium 133 (*)    Chloride 99 (*)    CO2 21 (*)    Glucose, Bld 163 (*)    GFR calc non Af Amer 59 (*)    All other components within normal limits  CBC - Abnormal; Notable for the following:    MCV 75.9 (*)    MCH 25.2 (*)    RDW 15.6 (*)    All other components within normal limits  HEPATIC FUNCTION PANEL - Abnormal; Notable for the following:    AST 48 (*)    All other components within normal limits  LIPASE, BLOOD  D-DIMER, QUANTITATIVE (NOT AT [facility])   I-STAT TROPOININ, ED  I-STAT TROPOININ, ED  Randolm Idol, ED    EKG  EKG Interpretation  Date/Time:  Wednesday November 26 2016 11:06:14 EST Ventricular Rate:  129 PR Interval:    QRS Duration: 96 QT Interval:  352 QTC Calculation: 516 R Axis:   82 Text Interpretation:  Sinus tachycardia Probable left atrial enlargement Borderline right axis deviation Low voltage, precordial leads Nonspecific T abnormalities, diffuse leads Prolonged QT interval No significant change since last tracing Confirmed by [facility] MD, Eileene Kisling ([id number]) on 11/26/2016 11:09:54 AM       Radiology Dg Chest 2 View  Result Date: 11/26/2016 CLINICAL DATA:  Chest pain for several hours with shortness of breath, initial encounter EXAM: CHEST  2 VIEW COMPARISON:  12/13/2009 FINDINGS: Cardiac shadow is stable. The lungs are well aerated bilaterally. No focal infiltrate or sizable effusion is seen. No bony abnormality is noted. IMPRESSION: No active cardiopulmonary disease. Electronically Signed   By: Inez Catalina M.D.   On: 11/26/2016 12:51    Procedures Procedures (including critical care time)  Medications Ordered in ED Medications  sodium chloride 0.9 % bolus 1,000 mL (0 mLs Intravenous Stopped 11/26/16 1314)  cloNIDine (CATAPRES) tablet 0.2 mg (0.2 mg Oral Given 11/26/16 1315)     Initial Impression / Assessment and Plan / ED Course  I have reviewed the triage vital signs and the nursing notes.  Pertinent labs & imaging results that were available during my care of the patient were reviewed by me and considered in my medical decision making (see chart for details).  Clinical Course    70yo female with history of DM, htn, presents with concern for chest pain intermittently since last night.  Differential diagnosis for chest pain includes pulmonary embolus, dissection, pneumothorax, pneumonia, ACS, myocarditis, pericarditis.  EKG was done and evaluate by me and showed no acute ST changes and no signs of  pericarditis, with sinus tachycardia similar to prior. Chest x-ray was done and evaluated by me and radiology and showedt no sign of pneumonia or pneumothorax. Patient is low risk [place] with negative ddimer and have low suspicion for PE.  Delta troponins negative. Given this evaluation,  history and physical have low suspicion for pulmonary embolus, pneumonia, ACS, myocarditis, pericarditis, dissection.   Given nonexertional chest pain beginning after laying down with heavy meal, have lower suspicion for anginal equivalent, with reflux also likely. Discussed that given risk factors for heart disease, observation in hospital reasonable but given history feel close outpatient follow up and return for worsening is also appropriate. Will initiate bid protonix and have pt follow up with Cardiology for possible outpt stress test.  Patient with chronic tachycardia noted on multiple prior visits and per pt history. Given home dose of clonidine which she missed. Prior to discharge, patient developed temp 100.5. No sign pneumonia, no sign otitis media/sinus infxn, no urinary symptoms, no rash. Pt with son with cough/congestion and developed congestion today and suspect beginning of viral syndrome, however again discussed strict return precautions.  Patient discharged in stable condition with understanding of reasons to return and recommend PCP follow up.   Final Clinical Impressions(s) / ED Diagnoses   Final diagnoses:  Chest pain, unspecified type  Fever, unspecified fever cause, suspect viral etiology    New Prescriptions Discharge Medication List as of 11/26/2016  4:04 PM    START taking these medications   Details  pantoprazole (PROTONIX) 20 MG tablet Take 1 tablet (20 mg total) by mouth 2 (two) times daily before a meal., Starting Wed 11/26/2016, Until Fri 12/26/2016, Print         Gareth Morgan, MD 11/26/16 2232

## 2016-11-28 ENCOUNTER — Telehealth: Payer: Self-pay

## 2016-11-28 NOTE — Telephone Encounter (Signed)
Patient is calling because she picked up a cold during her ER visit. She would like to know if Chelle can send her something for head congestion and runny nose. Please advise!  [phone]  [facility] [address]

## 2016-12-02 ENCOUNTER — Other Ambulatory Visit: Payer: Self-pay | Admitting: Physician Assistant

## 2016-12-02 DIAGNOSIS — F329 Major depressive disorder, single episode, unspecified: Secondary | ICD-10-CM

## 2016-12-02 DIAGNOSIS — F32A Depression, unspecified: Secondary | ICD-10-CM

## 2016-12-02 NOTE — Telephone Encounter (Signed)
Pt reported that she is still having some head congestion, but it is a little improved. No fever or sinus pain/pressure. Recommended Mucinex (not Mucinex-D d/t HTN), and drinking plenty of water. Advised we can not Rx anything w/out eval first. Pt stated that she will probably just wait it out since it is improving but will come on in if worsens.

## 2016-12-03 ENCOUNTER — Encounter: Payer: Self-pay | Admitting: Physician Assistant

## 2016-12-04 NOTE — Telephone Encounter (Signed)
11/18/16 last ov

## 2016-12-16 ENCOUNTER — Encounter: Payer: Self-pay | Admitting: Physician Assistant

## 2016-12-16 ENCOUNTER — Ambulatory Visit: Payer: Commercial Managed Care - HMO | Admitting: Physician Assistant

## 2016-12-23 ENCOUNTER — Ambulatory Visit: Payer: Commercial Managed Care - HMO | Admitting: Physician Assistant

## 2016-12-24 ENCOUNTER — Encounter: Payer: Self-pay | Admitting: Physician Assistant

## 2016-12-24 ENCOUNTER — Ambulatory Visit (INDEPENDENT_AMBULATORY_CARE_PROVIDER_SITE_OTHER): Payer: Medicare HMO | Admitting: Physician Assistant

## 2016-12-24 VITALS — BP 166/88 | HR 118 | Temp 98.8°F | Resp 18 | Ht 71.0 in | Wt 249.0 lb

## 2016-12-24 DIAGNOSIS — E669 Obesity, unspecified: Secondary | ICD-10-CM

## 2016-12-24 DIAGNOSIS — F329 Major depressive disorder, single episode, unspecified: Secondary | ICD-10-CM

## 2016-12-24 DIAGNOSIS — M79671 Pain in right foot: Secondary | ICD-10-CM | POA: Diagnosis not present

## 2016-12-24 DIAGNOSIS — I10 Essential (primary) hypertension: Secondary | ICD-10-CM | POA: Diagnosis not present

## 2016-12-24 DIAGNOSIS — Z23 Encounter for immunization: Secondary | ICD-10-CM | POA: Diagnosis not present

## 2016-12-24 DIAGNOSIS — E1169 Type 2 diabetes mellitus with other specified complication: Secondary | ICD-10-CM | POA: Diagnosis not present

## 2016-12-24 DIAGNOSIS — M79672 Pain in left foot: Secondary | ICD-10-CM

## 2016-12-24 MED ORDER — TRIAMTERENE-HCTZ 37.5-25 MG PO CAPS: 1.0000 | ORAL_CAPSULE | Freq: Every day | ORAL | 3 refills | Status: DC

## 2016-12-24 MED ORDER — CLONIDINE HCL 0.2 MG PO TABS
0.2000 mg | ORAL_TABLET | Freq: Every day | ORAL | 3 refills | Status: DC
Start: 2016-12-24 — End: 2016-12-24

## 2016-12-24 MED ORDER — METFORMIN HCL 1000 MG PO TABS: 1000.0000 mg | ORAL_TABLET | Freq: Two times a day (BID) | ORAL | 3 refills | Status: DC

## 2016-12-24 MED ORDER — HYDROXYZINE HCL 25 MG PO TABS: 25.0000 mg | ORAL_TABLET | Freq: Every evening | ORAL | 0 refills | Status: DC | PRN

## 2016-12-24 MED ORDER — VERAPAMIL HCL ER 240 MG PO TBCR: 240.0000 mg | EXTENDED_RELEASE_TABLET | Freq: Two times a day (BID) | ORAL | 3 refills | Status: DC

## 2016-12-24 MED ORDER — ATORVASTATIN CALCIUM 20 MG PO TABS: 20.0000 mg | ORAL_TABLET | Freq: Every day | ORAL | 3 refills | Status: DC

## 2016-12-24 MED ORDER — ZOSTER VAC RECOMB ADJUVANTED 50 MCG/0.5ML IM SUSR
50.0000 ug | Freq: Once | INTRAMUSCULAR | 0 refills | Status: AC
Start: 2016-12-24 — End: 2016-12-24

## 2016-12-24 MED ORDER — PANTOPRAZOLE SODIUM 20 MG PO TBEC: 20.0000 mg | DELAYED_RELEASE_TABLET | Freq: Two times a day (BID) | ORAL | 0 refills | Status: DC

## 2016-12-24 MED ORDER — HYDROCORTISONE 2.5 % RE CREA: 1.0000 "application " | TOPICAL_CREAM | Freq: Two times a day (BID) | RECTAL | 0 refills | Status: DC

## 2016-12-24 MED ORDER — CLONIDINE HCL 0.2 MG PO TABS: 0.2000 mg | ORAL_TABLET | Freq: Every day | ORAL | 3 refills | Status: DC

## 2016-12-24 NOTE — Patient Instructions (Addendum)
1. Start the venlafaxine. 2. Take all your medications as prescribed. Don't leave them at home when you travel, and remember to bring them back! 3. The foot specialist office will call you to schedule an appointment.     IF you received an x-ray today, you will receive an invoice from [facility]. Please contact [facility] at [phone] with questions or concerns regarding your invoice.   IF you received labwork today, you will receive an invoice from [facility]. Please contact [facility] at [phone] with questions or concerns regarding your invoice.   Our billing staff will not be able to assist you with questions regarding bills from these companies.  You will be contacted with the lab results as soon as they are available. The fastest way to get your results is to activate your My Chart account. Instructions are located on the last page of this paperwork. If you have not heard from us regarding the results in 2 weeks, please contact this office.

## 2016-12-24 NOTE — Progress Notes (Deleted)
Patient ID: Frances Cabrera, female    DOB: [birth year], 71 y.o.   MRN: [id number]  PCP: Harrison Mons, PA-C  Chief Complaint  Patient presents with  . Follow-up    HBP     Subjective:   Presents for evaluation of ***.  ***.    Review of Systems     Patient Active Problem List   Diagnosis Date Noted  . Diabetes mellitus type 2 in obese ([place]) 05/30/2012  . HTN (hypertension) 05/30/2012  . Microalbuminuria   . Recurrent UTI   . BMI 35.0-35.9,adult   . Elevated LFTs      Prior to Admission medications   Medication Sig Start Date End Date Taking? Authorizing Provider  acetaminophen (TYLENOL) 500 MG tablet Take 500 mg by mouth every 6 (six) hours as needed for moderate pain or headache. Reported on 05/31/2016   Yes Historical Provider, MD  blood glucose meter kit and supplies KIT Use to test blood sugar once daily. E11.9 12/31/15  Yes Chelle Jeffery, PA-C  Blood Glucose Monitoring Suppl (ACCU-CHEK AVIVA) device Use to test blood sugar once daily. E11.9 02/08/16 02/07/17 Yes Chelle Jeffery, PA-C  Blood Pressure Monitoring (B-D ASSURE BPM/AUTO WRIST CUFF) MISC Use to measure home blood pressure. 09/05/15  Yes Chelle Jeffery, PA-C  ciprofloxacin (CIPRO) 250 MG tablet Take 1 tablet (250 mg total) by mouth 2 (two) times daily. 09/12/16  Yes Chelle Jeffery, PA-C  cloNIDine (CATAPRES) 0.2 MG tablet TAKE 1 TABLET (0.2 MG TOTAL) BY MOUTH DAILY. 07/16/16  Yes Chelle Jeffery, PA-C  fluconazole (DIFLUCAN) 150 MG tablet Take 1 tablet by mouth once. May repeat after finishing antibiotic if needed. 09/12/16  Yes Chelle Jeffery, PA-C  glucose blood test strip Use to test blood sugar once daily. E11.9 02/08/16  Yes Chelle Jeffery, PA-C  hydrOXYzine (ATARAX/VISTARIL) 25 MG tablet Take 1-3 tablets (25-75 mg total) by mouth at bedtime as needed for anxiety (insomnia). 05/31/16  Yes Harrison Mons, PA-C  Lancet Device MISC Use for home glucose monitoring 02/08/16  Yes Chelle Jeffery, PA-C    Lancets (ONETOUCH ULTRASOFT) lancets Use to test blood sugar once daily. E11.9 12/31/15  Yes Chelle Jeffery, PA-C  lisinopril (PRINIVIL,ZESTRIL) 20 MG tablet Take 1 tablet (20 mg total) by mouth daily. Patient taking differently: Take 20 mg by mouth at bedtime.  05/31/16  Yes Chelle Jeffery, PA-C  metFORMIN (GLUCOPHAGE) 1000 MG tablet TAKE 1 TABLET (1,000 MG TOTAL) BY MOUTH 2 (TWO) TIMES DAILY WITH A MEAL. 07/16/16  Yes Chelle Jeffery, PA-C  Multiple Vitamins-Minerals (MULTIVITAMIN WITH MINERALS) tablet Take 1 tablet by mouth at bedtime. Reported on 05/31/2016   Yes Historical Provider, MD  pantoprazole (PROTONIX) 20 MG tablet Take 1 tablet (20 mg total) by mouth 2 (two) times daily before a meal. 11/26/16 12/26/16 Yes Gareth Morgan, MD  triamterene-hydrochlorothiazide (DYAZIDE) 37.5-25 MG capsule TAKE 1 CAPSULE EVERY DAY 07/16/16  Yes Chelle Jeffery, PA-C  Venlafaxine HCl 37.5 MG TB24 Take 2 tablets (75 mg total) by mouth daily. 12/04/16  Yes Chelle Jeffery, PA-C  verapamil (CALAN-SR) 240 MG CR tablet Take 1 tablet (240 mg total) by mouth 2 (two) times daily. 11/28/15  Yes Chelle Jeffery, PA-C  zoster vaccine live, PF, (ZOSTAVAX) 81157 UNT/0.65ML injection Inject 19,400 Units into the skin once. 09/05/15  Yes Harrison Mons, PA-C     Allergies  Allergen Reactions  . Penicillins Hives    Has patient had a PCN reaction causing immediate rash, facial/tongue/throat swelling, SOB or lightheadedness with hypotension: Yes Has  patient had a PCN reaction causing severe rash involving mucus membranes or skin necrosis: Yes Has patient had a PCN reaction that required hospitalization No Has patient had a PCN reaction occurring within the last 10 years: No If all of the above answers are "NO", then may proceed with Cephalosporin use.   Marland Kitchen Shrimp [Shellfish Allergy] Rash       Objective:  Physical Exam         Assessment & Plan:   ***

## 2016-12-24 NOTE — Progress Notes (Signed)
Patient ID: Frances Cabrera, female    DOB: 1946-11-02, 71 y.o.   MRN: [id number]  PCP: Harrison Mons, PA-C  Chief Complaint  Patient presents with  . Follow-up    HBP     Subjective:   Presents for evaluation of HTN, tachycardia and depressed and anxious mood.  Last month, I recommended that she start the venlafaxine, increase lisinopril dose and move the dose to HS since she felt that it made her sleepy. Tachycardia thought due to her anxiety, though beta blocker was considered and not started due to her depression.  A1C at that visit 11/18/16 was 7.0%, down from 8.1%.  She was seen in the ED on 12/13 with CP, determined to be GERD. Treatment with pantoprazole, and symptoms have resolved.  She lost the Rx for atorvastatin. hydroxyzine prescription issue with tablets vs. Capsules? On further investigation, it was actually venlafaxine that was the issue. She has it now. But she just got it today and hasn't started it yet. She left the hydroxyzine here when she went back to [facility], so hasn't been taking that either.  Her feet continue to hurt. This was suspected to be peripheral neuropathy related to diabetes, but she's had better control of of her diabetes than we expected. Pain is in the toes. No burning. Feel tight , like they need stretching. Tingling, pins and needles. Acetaminophen doesn't help. Keeps her from sleeping. Tried ice massage, which helped a little bit, but melted in the bed and made a mess. Sometimes has pain when she first gets up and walks. Some days she has no pain at all. Some nights she has no pain. Her sister uses Voltaren gel and advised her to check if she could get some.  Reports very dry skin, and requests advice. Needs a refill of hemorrhoid cream.  "I need my medicine. All of it."  Her granddaughter is back. She [facility] [place] [facility]. She is now at her father's house (the patient's son), wearing an ankle monitor.  "I  give her five days."    Review of Systems As above. No CP, SOB, HA, dizziness. No nausea, vomiting, diarrhea. No urinary symptoms. Not recently sexually active.    Patient Active Problem List   Diagnosis Date Noted  . Diabetes mellitus type 2 in obese ([place]) 05/30/2012  . HTN (hypertension) 05/30/2012  . Microalbuminuria   . Recurrent UTI   . BMI 35.0-35.9,adult   . Elevated LFTs      Prior to Admission medications   Medication Sig Start Date End Date Taking? Authorizing Provider  acetaminophen (TYLENOL) 500 MG tablet Take 500 mg by mouth every 6 (six) hours as needed for moderate pain or headache. Reported on 05/31/2016   Yes Historical Provider, MD  blood glucose meter kit and supplies KIT Use to test blood sugar once daily. E11.9 12/31/15  Yes Mykeal Carrick, PA-C  Blood Glucose Monitoring Suppl (ACCU-CHEK AVIVA) device Use to test blood sugar once daily. E11.9 02/08/16 02/07/17 Yes Javarius Tsosie, PA-C  Blood Pressure Monitoring (B-D ASSURE BPM/AUTO WRIST CUFF) MISC Use to measure home blood pressure. 09/05/15  Yes Eliz Nigg, PA-C  cloNIDine (CATAPRES) 0.2 MG tablet TAKE 1 TABLET (0.2 MG TOTAL) BY MOUTH DAILY. 07/16/16  Yes Chevon Fomby, PA-C  glucose blood test strip Use to test blood sugar once daily. E11.9 02/08/16  Yes Harrison Mons, PA-C  Lancet Device MISC Use for home glucose monitoring 02/08/16  Yes Aaryn Parrilla Jacqulynn Cadet, PA-C  Lancets (ONETOUCH ULTRASOFT)  lancets Use to test blood sugar once daily. E11.9 12/31/15  Yes Dovid Bartko, PA-C  lisinopril (PRINIVIL,ZESTRIL) 20 MG tablet Take 1 tablet (20 mg total) by mouth daily. Patient taking differently: Take 20 mg by mouth at bedtime.  05/31/16  Yes Jeanet Lupe, PA-C  metFORMIN (GLUCOPHAGE) 1000 MG tablet TAKE 1 TABLET (1,000 MG TOTAL) BY MOUTH 2 (TWO) TIMES DAILY WITH A MEAL. 07/16/16  Yes Perl Folmar, PA-C  Multiple Vitamins-Minerals (MULTIVITAMIN WITH MINERALS) tablet Take 1 tablet by mouth at bedtime. Reported on  05/31/2016   Yes Historical Provider, MD  pantoprazole (PROTONIX) 20 MG tablet Take 1 tablet (20 mg total) by mouth 2 (two) times daily before a meal. 11/26/16 12/26/16 Yes Gareth Morgan, MD  triamterene-hydrochlorothiazide (DYAZIDE) 37.5-25 MG capsule TAKE 1 CAPSULE EVERY DAY 07/16/16  Yes Joleigh Mineau, PA-C  verapamil (CALAN-SR) 240 MG CR tablet Take 1 tablet (240 mg total) by mouth 2 (two) times daily. 11/28/15  Yes Carlosdaniel Grob, PA-C  ciprofloxacin (CIPRO) 250 MG tablet Take 1 tablet (250 mg total) by mouth 2 (two) times daily. Patient not taking: Reported on 12/24/2016 09/12/16   Harrison Mons, PA-C  fluconazole (DIFLUCAN) 150 MG tablet Take 1 tablet by mouth once. May repeat after finishing antibiotic if needed. Patient not taking: Reported on 12/24/2016 09/12/16   Harrison Mons, PA-C  hydrOXYzine (ATARAX/VISTARIL) 25 MG tablet Take 1-3 tablets (25-75 mg total) by mouth at bedtime as needed for anxiety (insomnia). Patient not taking: Reported on 12/24/2016 05/31/16   Harrison Mons, PA-C  Venlafaxine HCl 37.5 MG TB24 Take 2 tablets (75 mg total) by mouth daily. Patient not taking: Reported on 12/24/2016 12/04/16   Harrison Mons, PA-C  zoster vaccine live, PF, (ZOSTAVAX) 95284 UNT/0.65ML injection Inject 19,400 Units into the skin once. Patient not taking: Reported on 12/24/2016 09/05/15   Harrison Mons, PA-C      Allergies  Allergen Reactions  . Penicillins Hives    Has patient had a PCN reaction causing immediate rash, facial/tongue/throat swelling, SOB or lightheadedness with hypotension: Yes Has patient had a PCN reaction causing severe rash involving mucus membranes or skin necrosis: Yes Has patient had a PCN reaction that required hospitalization No Has patient had a PCN reaction occurring within the last 10 years: No If all of the above answers are "NO", then may proceed with Cephalosporin use.   . Shrimp [Shellfish Allergy] Rash       Objective:  Physical Exam    Constitutional: She is oriented to person, place, and time. She appears well-developed and well-nourished. She is active and cooperative. No distress.  BP (!) 166/88 (BP Location: Right Arm, Patient Position: Sitting, Cuff Size: Large)   Pulse (!) 118   Temp 98.8 F (37.1 C) (Oral)   Resp 18   Ht _0  (1.803 m)   Wt 249 lb (112.9 kg)   SpO2 97%   BMI 34.73 kg/m    Eyes: Conjunctivae are normal.  Pulmonary/Chest: Effort normal.  Neurological: She is alert and oriented to person, place, and time.  Skin: Skin is warm and dry.  Psychiatric: She has a normal mood and affect. Her speech is normal and behavior is normal.       Wt Readings from Last 3 Encounters:  12/24/16 249 lb (112.9 kg)  11/26/16 252 lb (114.3 kg)  11/18/16 252 lb (114.3 kg)  05/09/16 260 lb 05/04/14 264 lb 08/10/12 272 lb      Assessment & Plan:   1. Essential hypertension BP remains uncontrolled,  but is better than last visit. I still believe that her anxiety is driving it to some extent. Again encouraged her to take the medications prescribed and to re-evaluate in 4-6 weeks. - verapamil (CALAN-SR) 240 MG CR tablet; Take 1 tablet (240 mg total) by mouth 2 (two) times daily.  Dispense: 180 tablet; Refill: 3 - triamterene-hydrochlorothiazide (DYAZIDE) 37.5-25 MG capsule; Take 1 each (1 capsule total) by mouth daily.  Dispense: 90 capsule; Refill: 3 - cloNIDine (CATAPRES) 0.2 MG tablet; Take 1 tablet (0.2 mg total) by mouth daily.  Dispense: 90 tablet; Refill: 3  2. Reactive depression She now has the venlafaxine. Advised to take it daily and use the hydroxyzine as needed for sleep. - hydrOXYzine (ATARAX/VISTARIL) 25 MG tablet; Take 1-3 tablets (25-75 mg total) by mouth at bedtime as needed for anxiety (insomnia).  Dispense: 90 tablet; Refill: 0  3. Diabetes mellitus type 2 in obese ([facility]) Improving control. - metFORMIN (GLUCOPHAGE) 1000 MG tablet; Take 1 tablet (1,000 mg total) by mouth 2 (two) times  daily with a meal.  Dispense: 180 tablet; Refill: 3  4. Foot pain, bilateral - Ambulatory referral to Podiatry  5. Need for shingles vaccine - Zoster Vac Recomb Adjuvanted ([place]) 50 MCG SUSR; Inject 50 mcg into the muscle once.  Dispense: 1 each; Refill: 0   Fara Chute, PA-C Physician Assistant-Certified [facility] [place]

## 2016-12-30 ENCOUNTER — Other Ambulatory Visit: Payer: Self-pay

## 2016-12-30 DIAGNOSIS — F329 Major depressive disorder, single episode, unspecified: Secondary | ICD-10-CM

## 2016-12-30 MED ORDER — HYDROCORTISONE 2.5 % RE CREA: 1.0000 "application " | TOPICAL_CREAM | Freq: Two times a day (BID) | RECTAL | 0 refills | Status: DC

## 2016-12-30 MED ORDER — HYDROXYZINE HCL 25 MG PO TABS: 25.0000 mg | ORAL_TABLET | Freq: Every evening | ORAL | 0 refills | Status: DC | PRN

## 2016-12-30 NOTE — Telephone Encounter (Signed)
Needs clarification on rectal cream directions sent to pharmacy.

## 2016-12-30 NOTE — Telephone Encounter (Signed)
Meds ordered this encounter  Medications  . hydrocortisone (ANUSOL-HC) 2.5 % rectal cream    Sig: Place 1 application rectally 2 (two) times daily.    Dispense:  90 g    Refill:  0  . hydrOXYzine (ATARAX/VISTARIL) 25 MG tablet    Sig: Take 1-3 tablets (25-75 mg total) by mouth at bedtime as needed for anxiety (insomnia).    Dispense:  90 tablet    Refill:  0

## 2016-12-30 NOTE — Telephone Encounter (Signed)
Also is rx for atarax or vistaril? Pharmacy can only fill one .

## 2017-01-02 ENCOUNTER — Other Ambulatory Visit: Payer: Self-pay | Admitting: Physician Assistant

## 2017-01-03 ENCOUNTER — Telehealth: Payer: Self-pay

## 2017-01-03 NOTE — Telephone Encounter (Signed)
Frances Cabrera Patient is calling to see if Frances Cabrera could call in her Protonix to her [facility] instead of the [facility].  She states that she spoke with [facility] and it would be 5-7 business days before she could get her prescription and she doesn't want to wait that long.  If she has to wait on [facility], she is wondering if she could take something over the counter until it comes in. Please advise  [phone]

## 2017-01-04 MED ORDER — PANTOPRAZOLE SODIUM 20 MG PO TBEC
20.0000 mg | DELAYED_RELEASE_TABLET | Freq: Two times a day (BID) | ORAL | 0 refills | Status: DC
Start: 2017-01-04 — End: 2017-04-05

## 2017-01-05 ENCOUNTER — Telehealth: Payer: Self-pay | Admitting: Physician Assistant

## 2017-01-05 NOTE — Telephone Encounter (Signed)
This has been an issue every time I prescribe this. I thought this time I prescribed what finally went through the last time.  What is their preferred product for hemorrhoids?

## 2017-01-10 MED ORDER — HYDROCORTISONE 1 % RE CREA: TOPICAL_CREAM | RECTAL | 3 refills | Status: DC

## 2017-01-10 NOTE — Telephone Encounter (Signed)
I printed out the formulary for you, since I do not know what you normally prescribe, to see if it is on list looks like the one you ordered is covered tier 4. I have put the page you will likely need on top. Hope this helps.

## 2017-01-10 NOTE — Telephone Encounter (Signed)
Based on formulary, ProctoPak is tier 2. Other covered products are tier 4.  Meds ordered this encounter  Medications  . hydrocortisone (PROCTO-PAK) 1 % CREA    Sig: Apply as directed to symptomatic hemorrhoids    Dispense:  28.35 g    Refill:  3    Order Specific Question:   Supervising Provider    Answer:   Clelia CroftSHAW, EVA N [[id number]]

## 2017-01-12 NOTE — Telephone Encounter (Signed)
Pt calling for RX hydrocortosone I advised pt that it was already set to [facility]

## 2017-02-03 ENCOUNTER — Ambulatory Visit: Payer: Commercial Managed Care - HMO | Admitting: Physician Assistant

## 2017-02-19 ENCOUNTER — Emergency Department (HOSPITAL_COMMUNITY)
Admission: EM | Admit: 2017-02-19 | Discharge: 2017-02-20 | Disposition: A | Discharge status: Home / Self Care | Payer: Medicare HMO | Attending: Emergency Medicine | Admitting: Emergency Medicine

## 2017-02-19 ENCOUNTER — Encounter (HOSPITAL_COMMUNITY): Payer: Self-pay

## 2017-02-19 DIAGNOSIS — I10 Essential (primary) hypertension: Secondary | ICD-10-CM | POA: Diagnosis not present

## 2017-02-19 DIAGNOSIS — E119 Type 2 diabetes mellitus without complications: Secondary | ICD-10-CM | POA: Insufficient documentation

## 2017-02-19 DIAGNOSIS — Z79899 Other long term (current) drug therapy: Secondary | ICD-10-CM | POA: Diagnosis not present

## 2017-02-19 DIAGNOSIS — H73891 Other specified disorders of tympanic membrane, right ear: Secondary | ICD-10-CM | POA: Diagnosis not present

## 2017-02-19 DIAGNOSIS — Z7984 Long term (current) use of oral hypoglycemic drugs: Secondary | ICD-10-CM | POA: Insufficient documentation

## 2017-02-19 DIAGNOSIS — H9201 Otalgia, right ear: Secondary | ICD-10-CM | POA: Diagnosis present

## 2017-02-19 DIAGNOSIS — H6591 Unspecified nonsuppurative otitis media, right ear: Secondary | ICD-10-CM

## 2017-02-19 MED ORDER — OXYMETAZOLINE HCL 0.05 % NA SOLN: 1.0000 | Freq: Two times a day (BID) | NASAL | 0 refills | Status: DC

## 2017-02-19 NOTE — ED Notes (Signed)
Pt c/o right ear pain since this morning, getting progressively worse. Pt finds it uncomfortable to swallow

## 2017-02-19 NOTE — Discharge Instructions (Signed)
User medication as prescribed to help with your nasal congestion and middle ear effusion. You may continue taking Tylenol as prescribed over-the-counter as needed for pain relief. Continue taking your home medications as prescribed. I recommend following up with your primary care provider within the next 3-4 days or follow-up evaluation. I recommend following up regarding your middle ear effusion/ear pain, elevated heart rate and blood pressure seen in the ED tonight. Return to the emergency department if symptoms worsen or new onset of fever, headache, neck stiffness, pain/redness/swelling behind her right ear, loss of hearing, ear drainage, difficulty breathing, difficulty swallowing, facial or neck swelling.

## 2017-02-19 NOTE — ED Provider Notes (Signed)
[facility] DEPT Provider Note   CSN: [id number] Arrival date & time: 02/19/17  2158     History   Chief Complaint Chief Complaint  Patient presents with  . Otalgia    HPI Frances Cabrera is a 71 y.o. female.  HPI   Patient is a 71 year old female with history of diabetes, hypertension and tachycardia who presents to the ED with complaint of right ear pain, onset this morning. Patient reports yesterday and initially noticing mild discomfort to her right ear. She states when she woke up this morning she began having pain to her right ear. Patient reports pain is worse with swallowing endorses associated discomfort to the right side of her gums. Reports mild intermittent rhinorrhea. Reports taking Tylenol at home without relief. Denies fever, headache, facial/neck swelling, neck stiffness, year drainage, loss of hearing, tinnitus, dizziness, sore throat, cough, shortness of breath, chest pain, abdominal pain, vomiting, rash. Patient reports taking all of her her medications as prescribed.  Past Medical History:  Diagnosis Date  . Diabetes mellitus   . Elevated LFTs   . Hypertension   . Menopause   . Microalbuminuria   . Obesity   . Recurrent UTI    post-coital    Patient Active Problem List   Diagnosis Date Noted  . Diabetes mellitus type 2 in obese ([place]) 05/30/2012  . HTN (hypertension) 05/30/2012  . Microalbuminuria   . Recurrent UTI   . BMI 35.0-35.9,adult   . Elevated LFTs     Past Surgical History:  Procedure Laterality Date  . CHOLECYSTECTOMY  2000  . TUBAL LIGATION      OB History    No data available       Home Medications    Prior to Admission medications   Medication Sig Start Date End Date Taking? Authorizing Provider  acetaminophen (TYLENOL) 500 MG tablet Take 500 mg by mouth every 6 (six) hours as needed for moderate pain or headache. Reported on 05/31/2016    Historical Provider, MD  atorvastatin (LIPITOR) 20 MG tablet Take 1 tablet (20  mg total) by mouth daily. 12/24/16   Chelle Jeffery, PA-C  Blood Glucose Monitoring Suppl (ACCU-CHEK AVIVA PLUS) w/Device KIT USE AS DIRECTED 01/02/17   Chelle Jeffery, PA-C  Blood Pressure Monitoring (B-D ASSURE BPM/AUTO WRIST CUFF) MISC Use to measure home blood pressure. 09/05/15   Chelle Jeffery, PA-C  ciprofloxacin (CIPRO) 250 MG tablet Take 1 tablet (250 mg total) by mouth 2 (two) times daily. Patient not taking: Reported on 12/24/2016 09/12/16   Harrison Mons, PA-C  cloNIDine (CATAPRES) 0.2 MG tablet Take 1 tablet (0.2 mg total) by mouth daily. 12/24/16   Chelle Jeffery, PA-C  fluconazole (DIFLUCAN) 150 MG tablet Take 1 tablet by mouth once. May repeat after finishing antibiotic if needed. Patient not taking: Reported on 12/24/2016 09/12/16   Chelle Jeffery, PA-C  glucose blood test strip Use to test blood sugar once daily. E11.9 02/08/16   Chelle Jeffery, PA-C  hydrocortisone (PROCTO-PAK) 1 % CREA Apply as directed to symptomatic hemorrhoids 01/10/17   Harrison Mons, PA-C  hydrOXYzine (ATARAX/VISTARIL) 25 MG tablet Take 1-3 tablets (25-75 mg total) by mouth at bedtime as needed for anxiety (insomnia). 12/30/16   Harrison Mons, PA-C  Lancet Device MISC Use for home glucose monitoring 02/08/16   Harrison Mons, PA-C  Lancets [facility] ULTRASOFT) lancets Use to test blood sugar once daily. E11.9 12/31/15   Chelle Jeffery, PA-C  Lancets Misc. (ACCU-CHEK SOFTCLIX LANCET DEV) KIT USE FOR HOME GLUCOSE MONITORING 01/02/17  Chelle Jeffery, PA-C  lisinopril (PRINIVIL,ZESTRIL) 20 MG tablet Take 1 tablet (20 mg total) by mouth daily. Patient taking differently: Take 20 mg by mouth at bedtime.  05/31/16   Chelle Jeffery, PA-C  metFORMIN (GLUCOPHAGE) 1000 MG tablet Take 1 tablet (1,000 mg total) by mouth 2 (two) times daily with a meal. 12/24/16   Chelle Jeffery, PA-C  Multiple Vitamins-Minerals (MULTIVITAMIN WITH MINERALS) tablet Take 1 tablet by mouth at bedtime. Reported on 05/31/2016    Historical Provider, MD    oxymetazoline (AFRIN NASAL SPRAY) 0.05 % nasal spray Place 1 spray into both nostrils 2 (two) times daily. Spray once into each nostril twice daily for up to the next 3 days. Do not use for more than 3 days to prevent rebound rhinorrhea. 02/19/17   Nona Dell, PA-C  pantoprazole (PROTONIX) 20 MG tablet Take 1 tablet (20 mg total) by mouth 2 (two) times daily before a meal. 01/04/17 04/04/17  Chelle Jeffery, PA-C  triamterene-hydrochlorothiazide (DYAZIDE) 37.5-25 MG capsule Take 1 each (1 capsule total) by mouth daily. 12/24/16   Chelle Jeffery, PA-C  Venlafaxine HCl 37.5 MG TB24 Take 2 tablets (75 mg total) by mouth daily. Patient not taking: Reported on 12/24/2016 12/04/16   Chelle Jeffery, PA-C  verapamil (CALAN-SR) 240 MG CR tablet Take 1 tablet (240 mg total) by mouth 2 (two) times daily. 12/24/16   Harrison Mons, PA-C    Family History Family History  Problem Relation Age of Onset  . Diabetes Mother   . Hypertension Mother   . Kidney failure Mother   . Diabetes Father   . Diabetes Brother   . Diabetes Brother     Social History Social History  Substance Use Topics  . Smoking status: Never Smoker  . Smokeless tobacco: Never Used  . Alcohol use No     Allergies   Penicillins and Shrimp [shellfish allergy]   Review of Systems Review of Systems  HENT: Positive for dental problem (gums), ear pain and rhinorrhea.   All other systems reviewed and are negative.    Physical Exam Updated Vital Signs BP 188/85 (BP Location: Left Arm)   Pulse 106   Temp 98 F (36.7 C) (Oral)   Resp 18   SpO2 99%   Physical Exam  Constitutional: She is oriented to person, place, and time. She appears well-developed and well-nourished. No distress.  HENT:  Head: Normocephalic and atraumatic.  Right Ear: Hearing, external ear and ear canal normal. No drainage, swelling or tenderness. No mastoid tenderness. A middle ear effusion is present. No decreased hearing is noted.  Left Ear:  Hearing, tympanic membrane, external ear and ear canal normal. No drainage, swelling or tenderness. No mastoid tenderness.  No middle ear effusion. No decreased hearing is noted.  Nose: Nose normal. Right sinus exhibits no maxillary sinus tenderness and no frontal sinus tenderness. Left sinus exhibits no maxillary sinus tenderness and no frontal sinus tenderness.  Mouth/Throat: Uvula is midline, oropharynx is clear and moist and mucous membranes are normal. She has dentures (uppers and lowers). No oropharyngeal exudate, posterior oropharyngeal edema, posterior oropharyngeal erythema or tonsillar abscesses. No tonsillar exudate.  Mild TTP over right posterior upper and lower gums without any swelling, erythema, induration, fluctuance or drainage.   No trismus, drooling, facial/neck swelling or stridor on exam. No muffled voice. Floor of mouth soft.    Eyes: Conjunctivae and EOM are normal. Right eye exhibits no discharge. Left eye exhibits no discharge. No scleral icterus.  Neck: Normal range of  motion. Neck supple.  Cardiovascular: Regular rhythm, normal heart sounds and intact distal pulses.   Tachycardic, HR 116  Pulmonary/Chest: Effort normal and breath sounds normal. No respiratory distress. She has no wheezes. She has no rales. She exhibits no tenderness.  Abdominal: Soft. She exhibits no distension.  Musculoskeletal: Normal range of motion. She exhibits no edema.  Lymphadenopathy:    She has no cervical adenopathy.  Neurological: She is alert and oriented to person, place, and time.  Skin: Skin is warm and dry. She is not diaphoretic.  Nursing note and vitals reviewed.    ED Treatments / Results  Labs (all labs ordered are listed, but only abnormal results are displayed) Labs Reviewed - No data to display  EKG  EKG Interpretation None       Radiology No results found.  Procedures Procedures (including critical care time)  Medications Ordered in ED Medications - No data  to display   Initial Impression / Assessment and Plan / ED Course  I have reviewed the triage vital signs and the nursing notes.  Pertinent labs & imaging results that were available during my care of the patient were reviewed by me and considered in my medical decision making (see chart for details).     Patient presents with right ear pain that started this morning. Endorses associated rhinorrhea and worsening pain with swallowing. Denies fever. Initial vitals revealed heart rate 191/90, heart rate 124. Patient reports history of chronically elevated heart rate and blood pressure whenever she is in the hospital or at her doctor's office. Reports taking all of her home pain medications earlier today. Chart review shows patient's heart rate has ranged from 112-124, BP 149/70 - 180/68. During interview and exam, patient's heart rate decreased down to 110. Exam revealed small right middle ear effusion without signs of associated infection. No mastoid tenderness. Oral exam unremarkable. Moist heat his membranes. Clear oral pharynx. No gingival swelling. Remaining exam unremarkable. Suspect patient's ear pain is likely due to middle ear effusion secondary to sinusitis. Plan to discharge patient home with decongestion and symptomatically treatment. Advised patient to follow up with her primary care provider in the next 3-4 days regarding her ear pain and follow-up evaluation regarding her elevated blood pressure in the ED tonight. Discussed return precautions.  Final Clinical Impressions(s) / ED Diagnoses   Final diagnoses:  Fluid level behind tympanic membrane of right ear    New Prescriptions New Prescriptions   OXYMETAZOLINE (AFRIN NASAL SPRAY) 0.05 % NASAL SPRAY    Place 1 spray into both nostrils 2 (two) times daily. Spray once into each nostril twice daily for up to the next 3 days. Do not use for more than 3 days to prevent rebound rhinorrhea.     Chesley Noon [place], [place] 02/20/17  0002    Gareth Morgan, MD 02/27/17 [phone]

## 2017-03-17 ENCOUNTER — Emergency Department (HOSPITAL_COMMUNITY)
Admission: EM | Admit: 2017-03-17 | Discharge: 2017-03-18 | Disposition: A | Discharge status: Home / Self Care | Payer: Medicare HMO | Attending: Emergency Medicine | Admitting: Emergency Medicine

## 2017-03-17 ENCOUNTER — Encounter (HOSPITAL_COMMUNITY): Payer: Self-pay | Admitting: Emergency Medicine

## 2017-03-17 ENCOUNTER — Emergency Department (HOSPITAL_COMMUNITY): Payer: Medicare HMO

## 2017-03-17 DIAGNOSIS — Y939 Activity, unspecified: Secondary | ICD-10-CM | POA: Diagnosis not present

## 2017-03-17 DIAGNOSIS — I10 Essential (primary) hypertension: Secondary | ICD-10-CM | POA: Diagnosis not present

## 2017-03-17 DIAGNOSIS — R42 Dizziness and giddiness: Secondary | ICD-10-CM | POA: Diagnosis not present

## 2017-03-17 DIAGNOSIS — E119 Type 2 diabetes mellitus without complications: Secondary | ICD-10-CM | POA: Insufficient documentation

## 2017-03-17 DIAGNOSIS — Y929 Unspecified place or not applicable: Secondary | ICD-10-CM | POA: Diagnosis not present

## 2017-03-17 DIAGNOSIS — E860 Dehydration: Secondary | ICD-10-CM | POA: Insufficient documentation

## 2017-03-17 DIAGNOSIS — R404 Transient alteration of awareness: Secondary | ICD-10-CM | POA: Diagnosis not present

## 2017-03-17 DIAGNOSIS — Z7984 Long term (current) use of oral hypoglycemic drugs: Secondary | ICD-10-CM | POA: Diagnosis not present

## 2017-03-17 DIAGNOSIS — R55 Syncope and collapse: Secondary | ICD-10-CM

## 2017-03-17 DIAGNOSIS — Z79899 Other long term (current) drug therapy: Secondary | ICD-10-CM | POA: Insufficient documentation

## 2017-03-17 DIAGNOSIS — W19XXXA Unspecified fall, initial encounter: Secondary | ICD-10-CM | POA: Insufficient documentation

## 2017-03-17 DIAGNOSIS — Y999 Unspecified external cause status: Secondary | ICD-10-CM | POA: Diagnosis not present

## 2017-03-17 LAB — CBC
HCT: 39.2 % (ref 36.0–46.0)
Hemoglobin: 13.1 g/dL (ref 12.0–15.0)
MCH: 25.5 pg — ABNORMAL LOW (ref 26.0–34.0)
MCHC: 33.4 g/dL (ref 30.0–36.0)
MCV: 76.3 fL — ABNORMAL LOW (ref 78.0–100.0)
Platelets: 253 10*3/uL (ref 150–400)
RBC: 5.14 MIL/uL — ABNORMAL HIGH (ref 3.87–5.11)
RDW: 15.9 % — ABNORMAL HIGH (ref 11.5–15.5)
WBC: 8.9 10*3/uL (ref 4.0–10.5)

## 2017-03-17 LAB — URINALYSIS, ROUTINE W REFLEX MICROSCOPIC
Bilirubin Urine: NEGATIVE
Glucose, UA: NEGATIVE mg/dL
Hgb urine dipstick: NEGATIVE
Ketones, ur: NEGATIVE mg/dL
Leukocytes, UA: NEGATIVE
Nitrite: NEGATIVE
Protein, ur: NEGATIVE mg/dL
Specific Gravity, Urine: 1.017 (ref 1.005–1.030)
pH: 5 (ref 5.0–8.0)

## 2017-03-17 LAB — BASIC METABOLIC PANEL
Anion gap: 15 (ref 5–15)
BUN: 16 mg/dL (ref 6–20)
CO2: 20 mmol/L — ABNORMAL LOW (ref 22–32)
Calcium: 9.2 mg/dL (ref 8.9–10.3)
Chloride: 100 mmol/L — ABNORMAL LOW (ref 101–111)
Creatinine, Ser: 0.96 mg/dL (ref 0.44–1.00)
GFR calc Af Amer: >60 mL/min (ref >60)
GFR calc non Af Amer: 59 mL/min — ABNORMAL LOW (ref >60)
Glucose, Bld: 138 mg/dL — ABNORMAL HIGH (ref 65–99)
Potassium: 4.4 mmol/L (ref 3.5–5.1)
Sodium: 135 mmol/L (ref 135–145)

## 2017-03-17 LAB — CBG MONITORING, ED: Glucose-Capillary: 131 mg/dL — ABNORMAL HIGH (ref 65–99)

## 2017-03-17 MED ORDER — SODIUM CHLORIDE 0.9 % IV BOLUS (SEPSIS)
1000.0000 mL | Freq: Once | INTRAVENOUS | Status: AC
  Administered 2017-03-17: 1000 mL via INTRAVENOUS

## 2017-03-17 NOTE — ED Triage Notes (Signed)
Pt comes from home via EMS with complaints of near syncope.  Given 4 of zofran in route. States she was sitting on her porch with a sudden onset of light headedness.  Went to walk back to her house and went down.  Pt reports she remembers people standing around her and did not fully lose consciousness.  Denies chest pain, sob, numbness, or tingling.  A&O x4. Dizziness has since resolved but continues to have mild nausea. Vitals WNL. 12 lead unremarkable.  EMS denies orthostatic changes.

## 2017-03-17 NOTE — ED Provider Notes (Signed)
[facility] DEPT Provider Note   CSN: [id number] Arrival date & time: 03/17/17  1921    By signing my name below, I, Macon Large, attest that this documentation has been prepared under the direction and in the presence of [facility], [place]. Electronically Signed: Macon Large, ED Scribe. 03/17/17. 10:39 PM.  History   Chief Complaint No chief complaint on file.  The history is provided by the patient, a relative and a friend. No language interpreter was used.   HPI Comments: Frances Cabrera is a 71 y.o. female with PMHx of HTN, DM brought in by ambulance who presents to the Emergency Department for evaluation of near syncopal event just prior to arrival. Pt states she was sitting at home on the porch when she had a sudden feeling of flushing sensation and weakness developed. She states after getting up, she felt weak and fell. No witnesses to the fall. She states she was stepping down from the porch stairs, when she suddenly fell back. Pt denies head injury and any additional injuries. Per pt's son, he heard a noise and went outside. She was responsive, however "going in and out" and taking a few seconds to respond to his questions. He notes this lasted for ~5 minutes. She reports associated headache and intermittent nausea. Pt notes she "was feeling bad earlier in the day" accompanied by the nausea. Pt was given 4 of Zofran in route to the ED with significant relief. No nausea at this time. No alleviating factors noted for headache. Pt denies h/o of similar complaints. Pt denies vomiting, blood in stool, CP, SOB. Patient states that all she has eaten today is a few pieces of [place] and crackers.  Past Medical History:  Diagnosis Date  . Diabetes mellitus   . Elevated LFTs   . Hypertension   . Menopause   . Microalbuminuria   . Obesity   . Recurrent UTI    post-coital    Patient Active Problem List   Diagnosis Date Noted  . Diabetes mellitus type 2 in obese ([place])  05/30/2012  . HTN (hypertension) 05/30/2012  . Microalbuminuria   . Recurrent UTI   . BMI 35.0-35.9,adult   . Elevated LFTs     Past Surgical History:  Procedure Laterality Date  . CHOLECYSTECTOMY  2000  . TUBAL LIGATION      OB History    No data available       Home Medications    Prior to Admission medications   Medication Sig Start Date End Date Taking? Authorizing Provider  acetaminophen (TYLENOL) 500 MG tablet Take 500 mg by mouth every 6 (six) hours as needed for moderate pain or headache. Reported on 05/31/2016   Yes Historical Provider, MD  cloNIDine (CATAPRES) 0.2 MG tablet Take 1 tablet (0.2 mg total) by mouth daily. 12/24/16  Yes Chelle Jeffery, PA-C  lisinopril (PRINIVIL,ZESTRIL) 20 MG tablet Take 1 tablet (20 mg total) by mouth daily. Patient taking differently: Take 20 mg by mouth at bedtime.  05/31/16  Yes Chelle Jeffery, PA-C  metFORMIN (GLUCOPHAGE) 1000 MG tablet Take 1 tablet (1,000 mg total) by mouth 2 (two) times daily with a meal. 12/24/16  Yes Chelle Jeffery, PA-C  Multiple Vitamins-Minerals (MULTIVITAMIN WITH MINERALS) tablet Take 1 tablet by mouth at bedtime. Reported on 05/31/2016   Yes Historical Provider, MD  triamterene-hydrochlorothiazide (DYAZIDE) 37.5-25 MG capsule Take 1 each (1 capsule total) by mouth daily. 12/24/16  Yes Chelle Jeffery, PA-C  verapamil (CALAN-SR) 240 MG CR tablet Take 1 tablet (  240 mg total) by mouth 2 (two) times daily. 12/24/16  Yes Chelle Jeffery, PA-C  atorvastatin (LIPITOR) 20 MG tablet Take 1 tablet (20 mg total) by mouth daily. Patient not taking: Reported on 03/17/2017 12/24/16   Harrison Mons, PA-C  Blood Glucose Monitoring Suppl (ACCU-CHEK AVIVA PLUS) w/Device KIT USE AS DIRECTED 01/02/17   Chelle Jeffery, PA-C  Blood Pressure Monitoring (B-D ASSURE BPM/AUTO WRIST CUFF) MISC Use to measure home blood pressure. 09/05/15   Chelle Jeffery, PA-C  ciprofloxacin (CIPRO) 250 MG tablet Take 1 tablet (250 mg total) by mouth 2 (two) times  daily. Patient not taking: Reported on 12/24/2016 09/12/16   Harrison Mons, PA-C  fluconazole (DIFLUCAN) 150 MG tablet Take 1 tablet by mouth once. May repeat after finishing antibiotic if needed. Patient not taking: Reported on 12/24/2016 09/12/16   Chelle Jeffery, PA-C  glucose blood test strip Use to test blood sugar once daily. E11.9 02/08/16   Chelle Jeffery, PA-C  hydrocortisone (PROCTO-PAK) 1 % CREA Apply as directed to symptomatic hemorrhoids Patient not taking: Reported on 03/17/2017 01/10/17   Harrison Mons, PA-C  hydrOXYzine (ATARAX/VISTARIL) 25 MG tablet Take 1-3 tablets (25-75 mg total) by mouth at bedtime as needed for anxiety (insomnia). Patient not taking: Reported on 03/17/2017 12/30/16   Harrison Mons, PA-C  Lancet Device MISC Use for home glucose monitoring 02/08/16   Harrison Mons, PA-C  Lancets [facility] ULTRASOFT) lancets Use to test blood sugar once daily. E11.9 12/31/15   Chelle Jeffery, PA-C  Lancets Misc. (ACCU-CHEK SOFTCLIX LANCET DEV) KIT USE FOR HOME GLUCOSE MONITORING 01/02/17   Chelle Jeffery, PA-C  oxymetazoline (AFRIN NASAL SPRAY) 0.05 % nasal spray Place 1 spray into both nostrils 2 (two) times daily. Spray once into each nostril twice daily for up to the next 3 days. Do not use for more than 3 days to prevent rebound rhinorrhea. Patient not taking: Reported on 03/17/2017 02/19/17   Nona Dell, PA-C  pantoprazole (PROTONIX) 20 MG tablet Take 1 tablet (20 mg total) by mouth 2 (two) times daily before a meal. Patient not taking: Reported on 03/17/2017 01/04/17 04/04/17  Harrison Mons, PA-C  Venlafaxine HCl 37.5 MG TB24 Take 2 tablets (75 mg total) by mouth daily. Patient not taking: Reported on 12/24/2016 12/04/16   Harrison Mons, PA-C    Family History Family History  Problem Relation Age of Onset  . Diabetes Mother   . Hypertension Mother   . Kidney failure Mother   . Diabetes Father   . Diabetes Brother   . Diabetes Brother     Social History Social  History  Substance Use Topics  . Smoking status: Never Smoker  . Smokeless tobacco: Never Used  . Alcohol use No     Allergies   Penicillins and Shrimp [shellfish allergy]   Review of Systems Review of Systems  Constitutional: Negative for fever.  Respiratory: Negative for shortness of breath.   Cardiovascular: Negative for chest pain.  Gastrointestinal: Positive for nausea. Negative for blood in stool and vomiting.  Neurological: Positive for dizziness, syncope (near-like), weakness and headaches.  All other systems reviewed and are negative.    Physical Exam Updated Vital Signs BP 127/83   Pulse 99   Temp 99.1 F (37.3 C) (Oral)   Resp (!) 23   SpO2 95%   Physical Exam  Constitutional: She is oriented to person, place, and time. She appears well-developed and well-nourished. No distress.  HENT:  Head: Normocephalic and atraumatic.  No evidence of tongue biting. Tacky  mucous membranes.   Neck: No JVD present.  Cardiovascular: Normal heart sounds and intact distal pulses.   No murmur heard. Tachycardic but regular.  Pulmonary/Chest: Effort normal and breath sounds normal. No respiratory distress. She has no wheezes. She has no rales.  Abdominal: Soft. She exhibits no distension. There is no tenderness.  Musculoskeletal: Normal range of motion. She exhibits no edema or tenderness.  No C/T/L tenderness.   Neurological: She is alert and oriented to person, place, and time.  Speech clear and goal oriented. CN 2-12 grossly intact. Normal finger-to-nose and rapid alternating movements. No drift. Strength and sensation intact. Steady gait.   Skin: Skin is warm and dry. Capillary refill takes less than 2 seconds.  Nursing note and vitals reviewed.    ED Treatments / Results   DIAGNOSTIC STUDIES: Oxygen Saturation is 95% on RA, adequate by my interpretation.    COORDINATION OF CARE: 10:31 PM Discussed treatment plan with pt at bedside which includes labs and EKG and  pt agreed to plan.   Labs (all labs ordered are listed, but only abnormal results are displayed) Labs Reviewed  BASIC METABOLIC PANEL - Abnormal; Notable for the following:       Result Value   Chloride 100 (*)    CO2 20 (*)    Glucose, Bld 138 (*)    GFR calc non Af Amer 59 (*)    All other components within normal limits  CBC - Abnormal; Notable for the following:    RBC 5.14 (*)    MCV 76.3 (*)    MCH 25.5 (*)    RDW 15.9 (*)    All other components within normal limits  URINALYSIS, ROUTINE W REFLEX MICROSCOPIC - Abnormal; Notable for the following:    APPearance HAZY (*)    All other components within normal limits  CBG MONITORING, ED - Abnormal; Notable for the following:    Glucose-Capillary 131 (*)    All other components within normal limits    EKG  EKG Interpretation  Date/Time:  Tuesday March 17 2017 19:50:17 EDT Ventricular Rate:  103 PR Interval:    QRS Duration: 88 QT Interval:  370 QTC Calculation: 485 R Axis:   79 Text Interpretation:  Sinus tachycardia Low voltage, precordial leads Borderline T wave abnormalities Confirmed by Zenia Resides  MD, ANTHONY ([id number]) on 03/17/2017 11:58:57 PM       Radiology Ct Head Wo Contrast  Result Date: 03/18/2017 CLINICAL DATA:  Weakness and dizziness.  Unwitnessed fall. EXAM: CT HEAD WITHOUT CONTRAST TECHNIQUE: Contiguous axial images were obtained from the base of the skull through the vertex without intravenous contrast. COMPARISON:  None. FINDINGS: Brain: No evidence of acute infarction, hemorrhage, hydrocephalus, extra-axial collection or mass lesion/mass effect. Vascular: No hyperdense vessel or unexpected calcification. Skull: Normal. Negative for fracture or focal lesion. Sinuses/Orbits: No acute finding. Mild mucosal thickening of the ethmoid sinus. Other: None. IMPRESSION: No acute intracranial abnormality.  No skull fracture noted. Electronically Signed   By: Ashley Royalty M.D.   On: 03/18/2017 00:02     Procedures Procedures (including critical care time)  Medications Ordered in ED Medications  sodium chloride 0.9 % bolus 1,000 mL (0 mLs Intravenous Stopped 03/18/17 0003)  sodium chloride 0.9 % bolus 1,000 mL (1,000 mLs Intravenous New Bag/Given 03/18/17 0007)     Initial Impression / Assessment and Plan / ED Course  I have reviewed the triage vital signs and the nursing notes.  Pertinent labs & imaging results that were available  during my care of the patient were reviewed by me and considered in my medical decision making (see chart for details).    Frances Cabrera is a 71 y.o. female who presents to ED for evaluation of near syncopal event. Only had a few pieces of [place] and 4-5 crackers in an attempt to diet. <1 cup of water today. Initially tachycardic, however heart rate improved with fluids. Mildly orthostatic. No focal neuro deficits on exam. EKG nonischemic. Labs and urine reviewed and reassuring. CT head negative. Patient hydrated in the ED. Evaluation does not show pathology that would require ongoing emergent intervention or inpatient treatment. Patient is hemodynamically stable and mentating appropriately.PCP follow up strongly encourage. Return precautions discussed and all questions answered.   Patient seen by and discussed with Dr. Zenia Resides who agrees with treatment plan.    Final Clinical Impressions(s) / ED Diagnoses   Final diagnoses:  Near syncope  Dehydration    New Prescriptions New Prescriptions   No medications on file   I personally performed the services described in this documentation, which was scribed in my presence. The recorded information has been reviewed and is accurate.     [facility] Jaz Mallick, PA-C 03/18/17 [phone]

## 2017-03-17 NOTE — ED Notes (Signed)
Bed: WU98 Expected date:  Expected time:  Means of arrival:  Comments: EMS 71 yo female syncopal episode/nausea-12 lead unremarkable

## 2017-03-17 NOTE — ED Notes (Signed)
Pt states that she felt fine during orthostatic vital signs, she states that there was not any feelings of dizziness.

## 2017-03-18 MED ORDER — SODIUM CHLORIDE 0.9 % IV BOLUS (SEPSIS)
1000.0000 mL | Freq: Once | INTRAVENOUS | Status: AC
  Administered 2017-03-18: 1000 mL via INTRAVENOUS

## 2017-03-18 NOTE — ED Provider Notes (Signed)
Medical screening examination/treatment/procedure(s) were conducted as a shared visit with non-physician practitioner(s) and myself.  I personally evaluated the patient during the encounter.   EKG Interpretation  Date/Time:  Tuesday March 17 2017 19:50:17 EDT Ventricular Rate:  103 PR Interval:    QRS Duration: 88 QT Interval:  370 QTC Calculation: 485 R Axis:   79 Text Interpretation:  Sinus tachycardia Low voltage, precordial leads Borderline T wave abnormalities Confirmed by Freida Busman  MD, [place] ([id number]) on 03/17/2017 11:58:68 PM     71 year old female presents after having a near syncopal event. States that she's been on diet recently. She was mildly with snacks. Will treat with IV hydration here and likely discharge home.   Lorre Nick, MD 03/18/17 [phone]

## 2017-03-18 NOTE — ED Notes (Signed)
Pt must finish fluids and repeat orthostatic vitals prior to discharge.

## 2017-03-18 NOTE — Discharge Instructions (Signed)
Increase hydration. Eat regularly scheduled snacks/meals.  Follow up with your primary care provider for discussion of today's visit.  Return to ER for chest pain, shortness of breath, new or worsening symptoms, any additional concerns.

## 2017-03-27 ENCOUNTER — Encounter (HOSPITAL_COMMUNITY): Payer: Self-pay

## 2017-03-27 ENCOUNTER — Emergency Department (HOSPITAL_COMMUNITY)
Admission: EM | Admit: 2017-03-27 | Discharge: 2017-03-27 | Disposition: A | Discharge status: Home / Self Care | Payer: Medicare HMO | Attending: Emergency Medicine | Admitting: Emergency Medicine

## 2017-03-27 DIAGNOSIS — Z7984 Long term (current) use of oral hypoglycemic drugs: Secondary | ICD-10-CM | POA: Insufficient documentation

## 2017-03-27 DIAGNOSIS — E119 Type 2 diabetes mellitus without complications: Secondary | ICD-10-CM | POA: Diagnosis not present

## 2017-03-27 DIAGNOSIS — Z79899 Other long term (current) drug therapy: Secondary | ICD-10-CM | POA: Insufficient documentation

## 2017-03-27 DIAGNOSIS — R42 Dizziness and giddiness: Secondary | ICD-10-CM | POA: Diagnosis not present

## 2017-03-27 DIAGNOSIS — R404 Transient alteration of awareness: Secondary | ICD-10-CM | POA: Diagnosis not present

## 2017-03-27 DIAGNOSIS — I10 Essential (primary) hypertension: Secondary | ICD-10-CM | POA: Diagnosis not present

## 2017-03-27 DIAGNOSIS — R531 Weakness: Secondary | ICD-10-CM | POA: Diagnosis not present

## 2017-03-27 LAB — CBC WITH DIFFERENTIAL/PLATELET
Basophils Absolute: 0 10*3/uL (ref 0.0–0.1)
Basophils Relative: 0 %
Eosinophils Absolute: 0 10*3/uL (ref 0.0–0.7)
Eosinophils Relative: 0 %
HCT: 39.6 % (ref 36.0–46.0)
Hemoglobin: 12.9 g/dL (ref 12.0–15.0)
Lymphocytes Relative: 33 %
Lymphs Abs: 2.6 10*3/uL (ref 0.7–4.0)
MCH: 24.9 pg — ABNORMAL LOW (ref 26.0–34.0)
MCHC: 32.6 g/dL (ref 30.0–36.0)
MCV: 76.3 fL — ABNORMAL LOW (ref 78.0–100.0)
Monocytes Absolute: 0.3 10*3/uL (ref 0.1–1.0)
Monocytes Relative: 4 %
Neutro Abs: 5 10*3/uL (ref 1.7–7.7)
Neutrophils Relative %: 63 %
Platelets: 331 10*3/uL (ref 150–400)
RBC: 5.19 MIL/uL — ABNORMAL HIGH (ref 3.87–5.11)
RDW: 16 % — ABNORMAL HIGH (ref 11.5–15.5)
WBC: 7.9 10*3/uL (ref 4.0–10.5)

## 2017-03-27 LAB — COMPREHENSIVE METABOLIC PANEL
ALT: 41 U/L (ref 14–54)
AST: 45 U/L — ABNORMAL HIGH (ref 15–41)
Albumin: 4.2 g/dL (ref 3.5–5.0)
Alkaline Phosphatase: 45 U/L (ref 38–126)
Anion gap: 15 (ref 5–15)
BUN: 9 mg/dL (ref 6–20)
CO2: 22 mmol/L (ref 22–32)
Calcium: 10.1 mg/dL (ref 8.9–10.3)
Chloride: 102 mmol/L (ref 101–111)
Creatinine, Ser: 0.94 mg/dL (ref 0.44–1.00)
GFR calc Af Amer: >60 mL/min (ref >60)
GFR calc non Af Amer: >60 mL/min (ref >60)
Glucose, Bld: 153 mg/dL — ABNORMAL HIGH (ref 65–99)
Potassium: 4.1 mmol/L (ref 3.5–5.1)
Sodium: 139 mmol/L (ref 135–145)
Total Bilirubin: 0.5 mg/dL (ref 0.3–1.2)
Total Protein: 8.1 g/dL (ref 6.5–8.1)

## 2017-03-27 MED ORDER — LORAZEPAM 1 MG PO TABS: 1.0000 mg | ORAL_TABLET | Freq: Four times a day (QID) | ORAL | 0 refills | Status: DC | PRN

## 2017-03-27 MED ORDER — SODIUM CHLORIDE 0.9 % IV BOLUS (SEPSIS)
1000.0000 mL | Freq: Once | INTRAVENOUS | Status: AC
  Administered 2017-03-27: 1000 mL via INTRAVENOUS

## 2017-03-27 MED ORDER — LORAZEPAM 2 MG/ML IJ SOLN
1.0000 mg | Freq: Once | INTRAMUSCULAR | Status: AC
  Administered 2017-03-27: 1 mg via INTRAVENOUS
  Filled 2017-03-27: qty 1

## 2017-03-27 NOTE — ED Triage Notes (Signed)
Pt coming from home with EMS c/o dizziness and feeling faint. Pt sts to Ems that her husband went out of town and spent all of their money after receiving their first disability check. Per EMS once the pt got into the truck and told EMS what had happened she felt better and no longer feels dizzy. Pt denies any CP or SHOB. Pt was tachycardic with EMS at 120 Bp 160/80. nad at this time. Pt ambulatory from stretcher to bed.

## 2017-03-27 NOTE — ED Provider Notes (Signed)
[facility] DEPT Provider Note   CSN: [id number] Arrival date & time: 03/27/17  1901     History   Chief Complaint Chief Complaint  Patient presents with  . Dizziness    HPI Frances Cabrera is a 71 y.o. female.  HPI  Patient was very stressed out as they have limited funds and her husband keeps spitting it and won't tell her why. Today they had a disagreement and she got very upset and was very anxious and she felt like she was going to pass out with some nausea. Her blood pressure was 169 systolic during this episode as well. Her symptoms are resolved now. She's had episodes like this before. She says she's been very stressed out recently. Chest pain, abdominal pain, vision change, neurologic change. No other associated or modifying symptoms. Has had some decreased intake recently.  Past Medical History:  Diagnosis Date  . Diabetes mellitus   . Elevated LFTs   . Hypertension   . Menopause   . Microalbuminuria   . Obesity   . Recurrent UTI    post-coital    Patient Active Problem List   Diagnosis Date Noted  . Diabetes mellitus type 2 in obese ([place]) 05/30/2012  . HTN (hypertension) 05/30/2012  . Microalbuminuria   . Recurrent UTI   . BMI 35.0-35.9,adult   . Elevated LFTs     Past Surgical History:  Procedure Laterality Date  . CHOLECYSTECTOMY  2000  . TUBAL LIGATION      OB History    No data available       Home Medications    Prior to Admission medications   Medication Sig Start Date End Date Taking? Authorizing Provider  acetaminophen (TYLENOL) 500 MG tablet Take 750 mg by mouth every 6 (six) hours as needed for moderate pain or headache. Reported on 05/31/2016   Yes Historical Provider, MD  cloNIDine (CATAPRES) 0.2 MG tablet Take 1 tablet (0.2 mg total) by mouth daily. 12/24/16  Yes Chelle Jeffery, PA-C  hydrocortisone (PROCTO-PAK) 1 % CREA Apply as directed to symptomatic hemorrhoids Patient taking differently: Apply 1 application topically  daily as needed (symptomatic hemorrhoids).  01/10/17  Yes Chelle Jeffery, PA-C  Ketotifen Fumarate (ALAWAY OP) Place 1 drop into both eyes 2 (two) times daily.   Yes Historical Provider, MD  lisinopril (PRINIVIL,ZESTRIL) 20 MG tablet Take 1 tablet (20 mg total) by mouth daily. Patient taking differently: Take 20 mg by mouth at bedtime.  05/31/16  Yes Chelle Jeffery, PA-C  metFORMIN (GLUCOPHAGE) 1000 MG tablet Take 1 tablet (1,000 mg total) by mouth 2 (two) times daily with a meal. 12/24/16  Yes Chelle Jeffery, PA-C  Multiple Vitamins-Minerals (MULTIVITAMIN WITH MINERALS) tablet Take 1 tablet by mouth daily. Centrum Silver   Yes Historical Provider, MD  pantoprazole (PROTONIX) 20 MG tablet Take 1 tablet (20 mg total) by mouth 2 (two) times daily before a meal. Patient taking differently: Take 20 mg by mouth 2 (two) times daily as needed for heartburn or indigestion.  01/04/17 04/04/17 Yes Chelle Jeffery, PA-C  triamterene-hydrochlorothiazide (DYAZIDE) 37.5-25 MG capsule Take 1 each (1 capsule total) by mouth daily. 12/24/16  Yes Chelle Jeffery, PA-C  verapamil (CALAN-SR) 240 MG CR tablet Take 1 tablet (240 mg total) by mouth 2 (two) times daily. 12/24/16  Yes Chelle Jeffery, PA-C  Blood Glucose Monitoring Suppl (ACCU-CHEK AVIVA PLUS) w/Device KIT USE AS DIRECTED 01/02/17   Chelle Jeffery, PA-C  Blood Pressure Monitoring (B-D ASSURE BPM/AUTO WRIST CUFF) MISC Use to measure home  blood pressure. 09/05/15   Chelle Jeffery, PA-C  glucose blood test strip Use to test blood sugar once daily. E11.9 02/08/16   Harrison Mons, PA-C  Lancet Device MISC Use for home glucose monitoring 02/08/16   Harrison Mons, PA-C  Lancets [facility] ULTRASOFT) lancets Use to test blood sugar once daily. E11.9 12/31/15   Chelle Jeffery, PA-C  Lancets Misc. (ACCU-CHEK SOFTCLIX LANCET DEV) KIT USE FOR HOME GLUCOSE MONITORING 01/02/17   Chelle Jeffery, PA-C  LORazepam (ATIVAN) 1 MG tablet Take 1 tablet (1 mg total) by mouth every 6 (six) hours  as needed for anxiety. 03/27/17   Merrily Pew, MD    Family History Family History  Problem Relation Age of Onset  . Diabetes Mother   . Hypertension Mother   . Kidney failure Mother   . Diabetes Father   . Diabetes Brother   . Diabetes Brother     Social History Social History  Substance Use Topics  . Smoking status: Never Smoker  . Smokeless tobacco: Never Used  . Alcohol use No     Allergies   Penicillins and Shrimp [shellfish allergy]   Review of Systems Review of Systems  All other systems reviewed and are negative.    Physical Exam Updated Vital Signs BP (!) 147/72 (BP Location: Right Arm)   Pulse 84   Temp 98.6 F (37 C) (Oral)   Resp 18   Ht _0  (1.803 m)   Wt 242 lb (109.8 kg)   SpO2 100%   BMI 33.75 kg/m   Physical Exam  Constitutional: She is oriented to person, place, and time. She appears well-developed and well-nourished.  HENT:  Head: Normocephalic and atraumatic.  Eyes: Conjunctivae and EOM are normal.  Neck: Normal range of motion.  Cardiovascular: Regular rhythm.  Tachycardia present.   Pulmonary/Chest: No stridor. No respiratory distress. She has no wheezes.  Abdominal: Soft. She exhibits no distension.  Musculoskeletal: Normal range of motion. She exhibits no edema or deformity.  Neurological: She is alert and oriented to person, place, and time. No cranial nerve deficit.  No altered mental status, able to give full seemingly accurate history.  Face is symmetric, EOM's intact, pupils equal and reactive, vision intact, tongue and uvula midline without deviation Upper and Lower extremity motor 5/5, intact pain perception in distal extremities, 2+ reflexes in biceps, patella and achilles tendons. Finger to nose normal, heel to shin normal. After fluids, Walks without assistance or evident ataxia.    Skin: Skin is warm and dry. No erythema. No pallor.  Nursing note and vitals reviewed.    ED Treatments / Results  Labs (all labs  ordered are listed, but only abnormal results are displayed) Labs Reviewed  CBC WITH DIFFERENTIAL/PLATELET - Abnormal; Notable for the following:       Result Value   RBC 5.19 (*)    MCV 76.3 (*)    MCH 24.9 (*)    RDW 16.0 (*)    All other components within normal limits  COMPREHENSIVE METABOLIC PANEL - Abnormal; Notable for the following:    Glucose, Bld 153 (*)    AST 45 (*)    All other components within normal limits    EKG  EKG Interpretation  Date/Time:  Friday March 27 2017 19:07:16 EDT Ventricular Rate:  105 PR Interval:    QRS Duration: 102 QT Interval:  352 QTC Calculation: 466 R Axis:   45 Text Interpretation:  Sinus tachycardia Borderline T abnormalities, anterior leads Confirmed by [facility] MD,  Sencere Symonette [phone]) on 03/27/2017 9:42:57 PM       Radiology No results found.  Procedures Procedures (including critical care time)  Medications Ordered in ED Medications  sodium chloride 0.9 % bolus 1,000 mL (0 mLs Intravenous Stopped 03/27/17 2206)  LORazepam (ATIVAN) injection 1 mg (1 mg Intravenous Given 03/27/17 2030)     Initial Impression / Assessment and Plan / ED Course  I have reviewed the triage vital signs and the nursing notes.  Pertinent labs & imaging results that were available during my care of the patient were reviewed by me and considered in my medical decision making (see chart for details).     Likely anxiety related with some hypovolemia. Neuro exam fine, doubt intracranial abnormalities at this time.  No CP/SOB or other symptoms to make cardiac likely, will screen with ECG.  Plan for ativan/fluids, likely dc.  Much improved. Likely anxiety/stress with element of hypovolemia. Stable for dc.   Final Clinical Impressions(s) / ED Diagnoses   Final diagnoses:  Dizziness    New Prescriptions Discharge Medication List as of 03/27/2017 10:45 PM    START taking these medications   Details  LORazepam (ATIVAN) 1 MG tablet Take 1 tablet (1 mg  total) by mouth every 6 (six) hours as needed for anxiety., Starting Fri 03/27/2017, Print         Merrily Pew, MD 03/28/17 [phone]

## 2017-03-27 NOTE — ED Notes (Signed)
Pt ambulated in the hall without difficulty. Pt denies any dizziness or feeling lightheaded

## 2017-03-31 ENCOUNTER — Ambulatory Visit: Payer: Commercial Managed Care - HMO | Admitting: Physician Assistant

## 2017-04-05 ENCOUNTER — Other Ambulatory Visit: Payer: Self-pay | Admitting: Physician Assistant

## 2017-04-05 DIAGNOSIS — R809 Proteinuria, unspecified: Secondary | ICD-10-CM

## 2017-04-05 DIAGNOSIS — I10 Essential (primary) hypertension: Secondary | ICD-10-CM

## 2017-04-06 ENCOUNTER — Inpatient Hospital Stay: Payer: Commercial Managed Care - HMO

## 2017-04-06 ENCOUNTER — Telehealth: Payer: Self-pay | Admitting: Physician Assistant

## 2017-04-06 NOTE — Telephone Encounter (Signed)
Pt called to say she is ready to see the podiatrist, she now has the means to pay for their copayment.  She also wanted to ask Chelle if it was okay to take the alprazalam she was prescribed at the hospital until she was seen again or if it was better to take lorazapam.  She is currently out of lorazapam.  Please advise [phone]

## 2017-04-06 NOTE — Telephone Encounter (Signed)
Referral re-sent to [facility] wq on 4/23

## 2017-04-08 ENCOUNTER — Encounter: Payer: Self-pay | Admitting: Physician Assistant

## 2017-04-09 MED ORDER — LORAZEPAM 1 MG PO TABS: 1.0000 mg | ORAL_TABLET | Freq: Four times a day (QID) | ORAL | 0 refills | Status: DC | PRN

## 2017-04-09 NOTE — Telephone Encounter (Signed)
Please call/fax to [facility] on [address]. Her son will get the medication to her.

## 2017-04-09 NOTE — Telephone Encounter (Signed)
Patient is now living in [place]. I have printed the Rx, and am waiting to hear back from her in My Chart regarding the pharmacy to fax/call it to.  Meds ordered this encounter  Medications  . LORazepam (ATIVAN) 1 MG tablet    Sig: Take 1 tablet (1 mg total) by mouth every 6 (six) hours as needed for anxiety.    Dispense:  10 tablet    Refill:  0    Order Specific Question:   Supervising Provider    Answer:   Clelia Croft, EVA N [[id number]]

## 2017-04-10 DIAGNOSIS — F419 Anxiety disorder, unspecified: Secondary | ICD-10-CM | POA: Diagnosis not present

## 2017-04-10 DIAGNOSIS — K219 Gastro-esophageal reflux disease without esophagitis: Secondary | ICD-10-CM | POA: Diagnosis not present

## 2017-04-10 DIAGNOSIS — E1140 Type 2 diabetes mellitus with diabetic neuropathy, unspecified: Secondary | ICD-10-CM | POA: Diagnosis not present

## 2017-04-10 DIAGNOSIS — I10 Essential (primary) hypertension: Secondary | ICD-10-CM | POA: Diagnosis not present

## 2017-04-20 ENCOUNTER — Encounter: Payer: Medicare HMO | Admitting: Podiatry

## 2017-04-27 ENCOUNTER — Other Ambulatory Visit: Payer: Self-pay | Admitting: Family Medicine

## 2017-04-27 DIAGNOSIS — Z1231 Encounter for screening mammogram for malignant neoplasm of breast: Secondary | ICD-10-CM

## 2017-05-01 NOTE — Progress Notes (Signed)
This encounter was created in error - please disregard.

## 2017-05-20 ENCOUNTER — Ambulatory Visit: Payer: Commercial Managed Care - HMO

## 2017-05-28 ENCOUNTER — Other Ambulatory Visit: Payer: Self-pay | Admitting: Physician Assistant

## 2017-06-07 IMAGING — CR DG CHEST 2V
2 series · 2 of 2 positions shown · non-contrast
Comparison: 12/13/2009

CLINICAL DATA: Chest pain for several hours with shortness of
breath, initial encounter

EXAM:
CHEST  2 VIEW

[w chest pa]
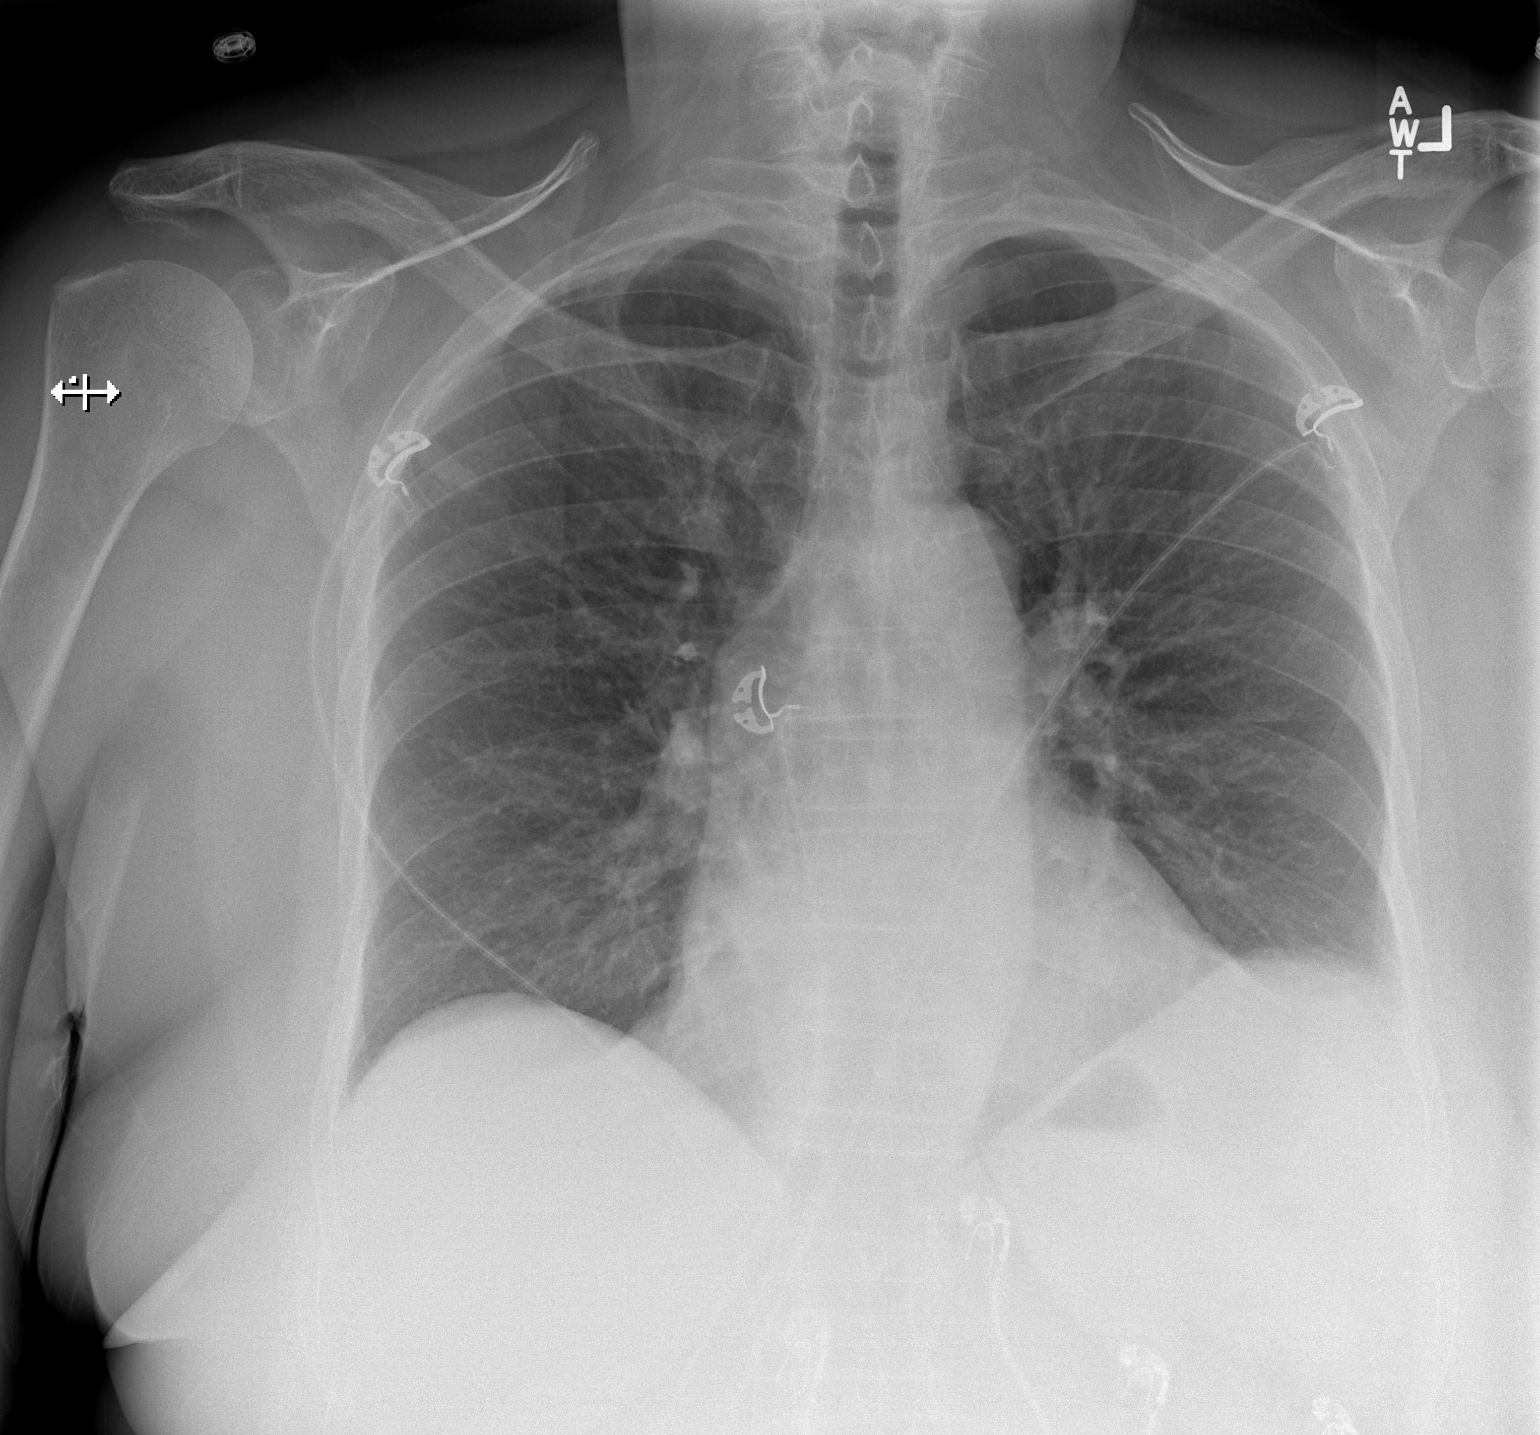

[w chest lat]
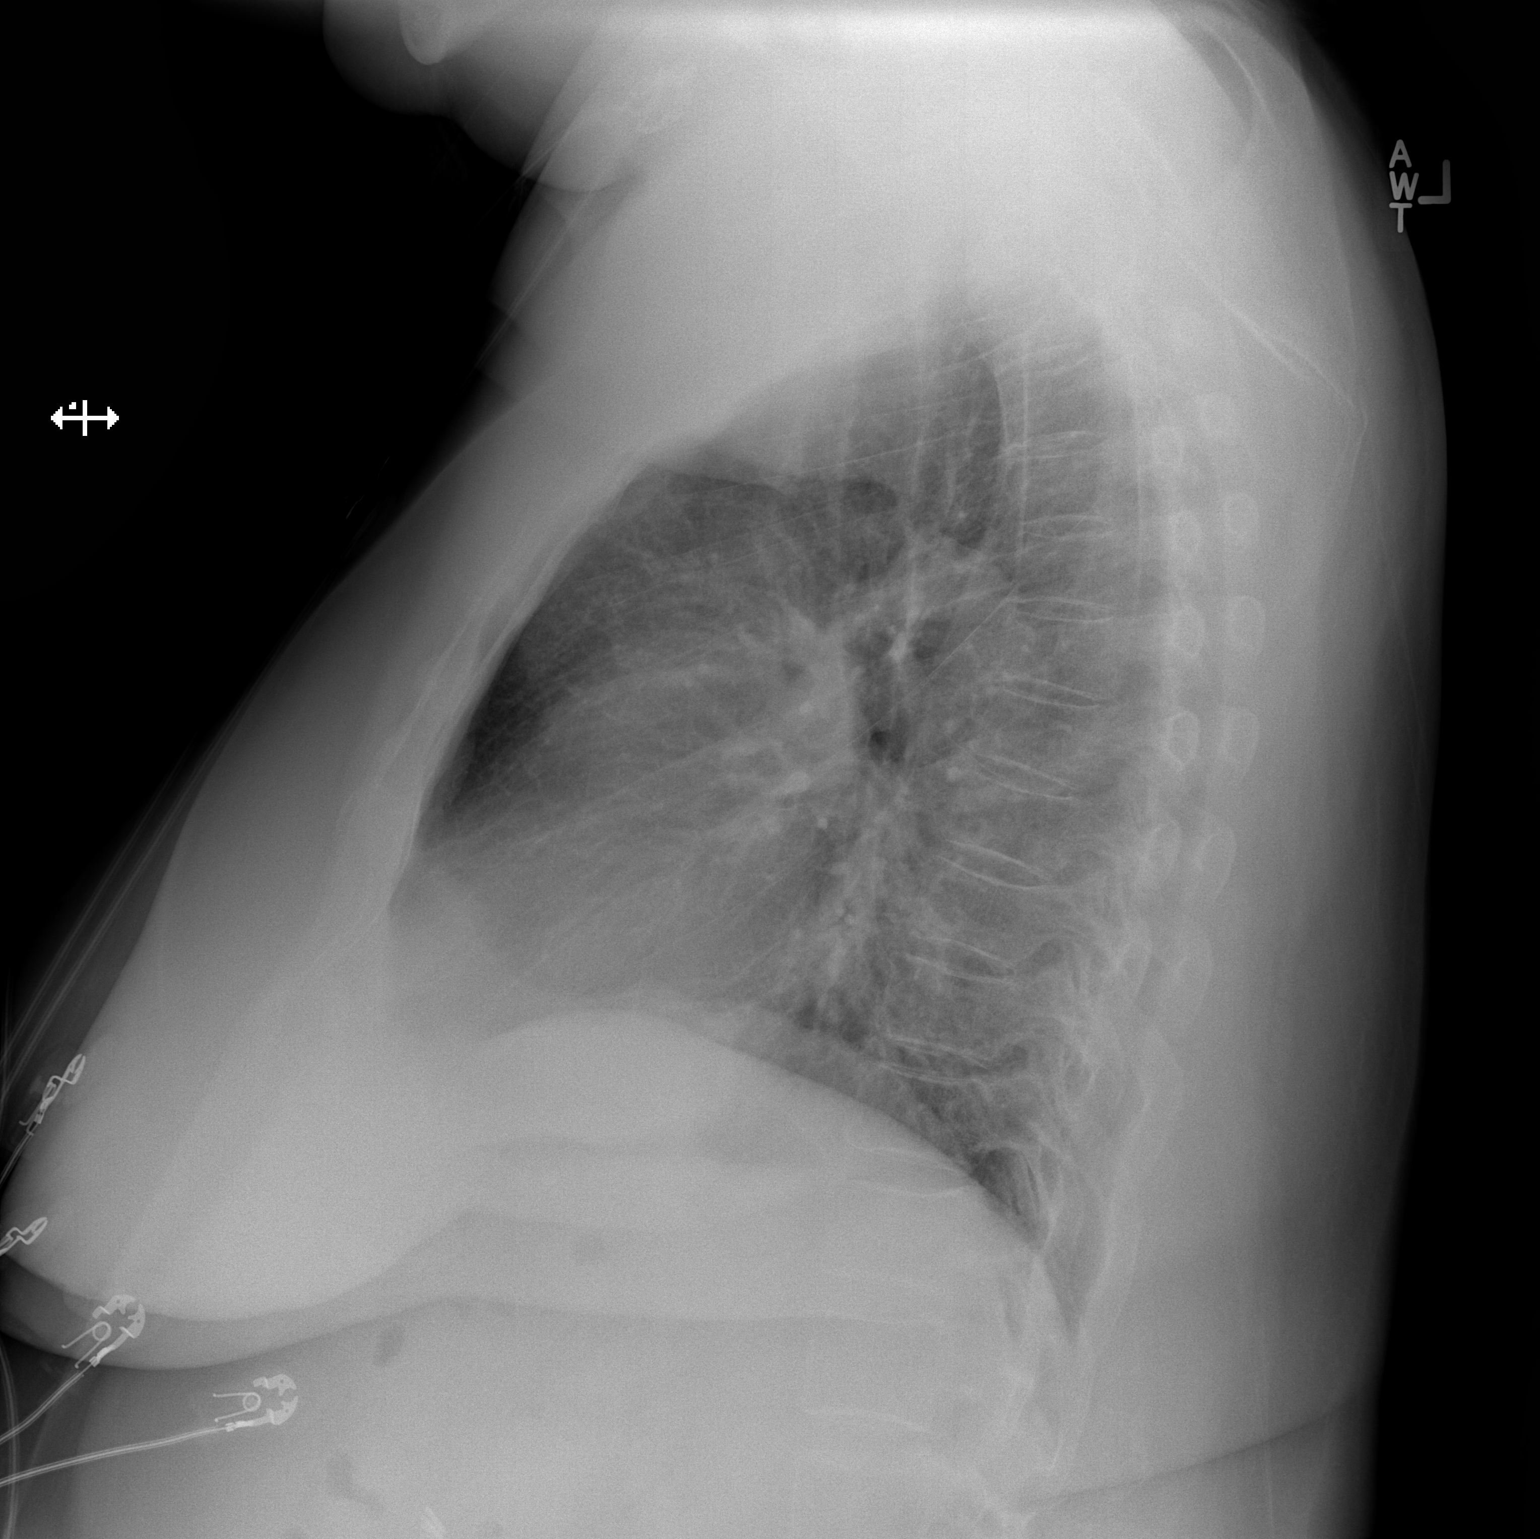

[2 of 2 positions shown; findings below may reference images not displayed]

FINDINGS: Cardiac shadow is stable. The lungs are well aerated bilaterally. No
focal infiltrate or sizable effusion is seen. No bony abnormality is
noted.
IMPRESSION: No active cardiopulmonary disease.

## 2017-08-17 ENCOUNTER — Other Ambulatory Visit: Payer: Self-pay | Admitting: Physician Assistant

## 2017-08-17 DIAGNOSIS — I10 Essential (primary) hypertension: Secondary | ICD-10-CM

## 2017-08-17 DIAGNOSIS — R809 Proteinuria, unspecified: Secondary | ICD-10-CM

## 2017-09-26 IMAGING — CT CT HEAD W/O CM
3 of 4 series · 15 of 47 positions shown, 18 images · non-contrast
Comparison: None.

CLINICAL DATA: Weakness and dizziness.  Unwitnessed fall.

EXAM:
CT HEAD WITHOUT CONTRAST
TECHNIQUE: Contiguous axial images were obtained from the base of the skull
through the vertex without intravenous contrast.

[Series 2: head w/o · axial · non-contrast · 0.47mm/px · z∈[+1402,+1527]mm · 9 of 33 slices shown, 12 images]
[im 4/33  brain]
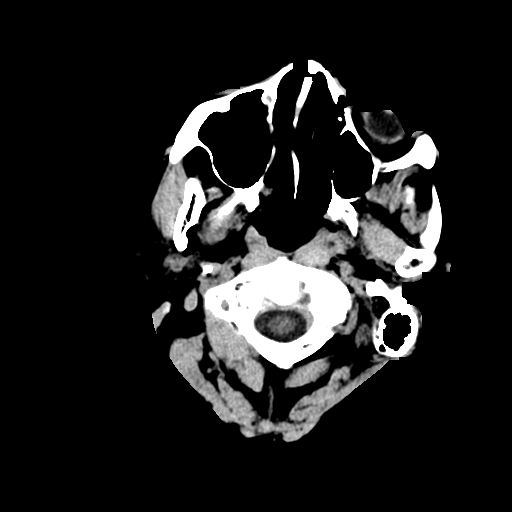
[im 4/33  bone]
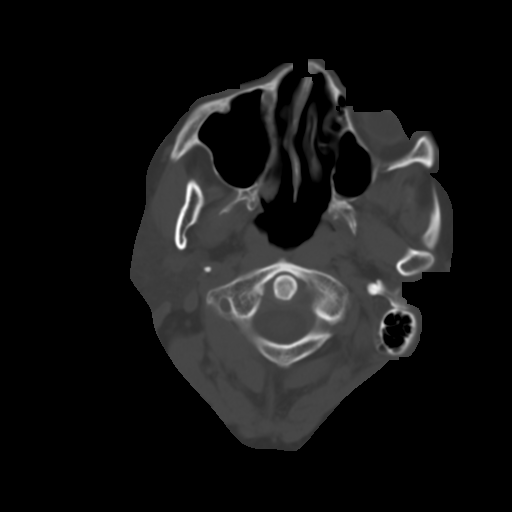
[im 7/33  brain]
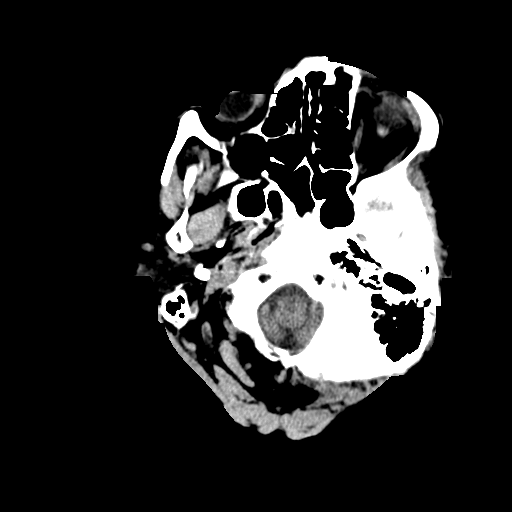
[im 10/33  brain]
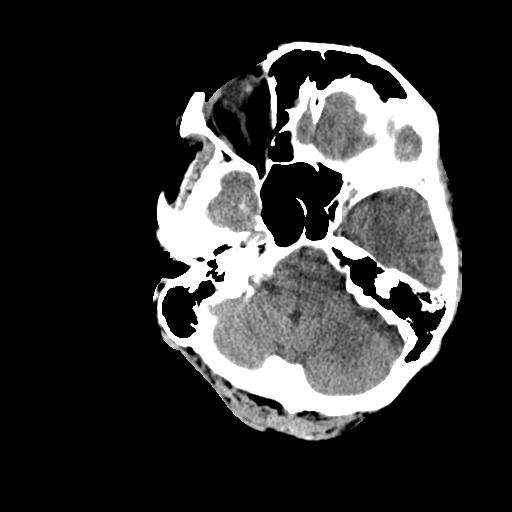
[im 13/33  brain]
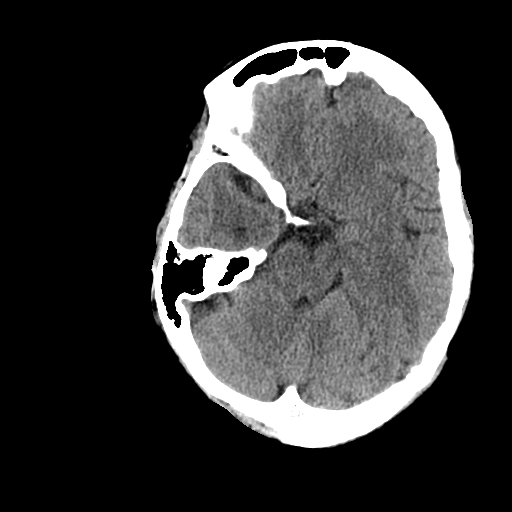
[im 17/33  brain]
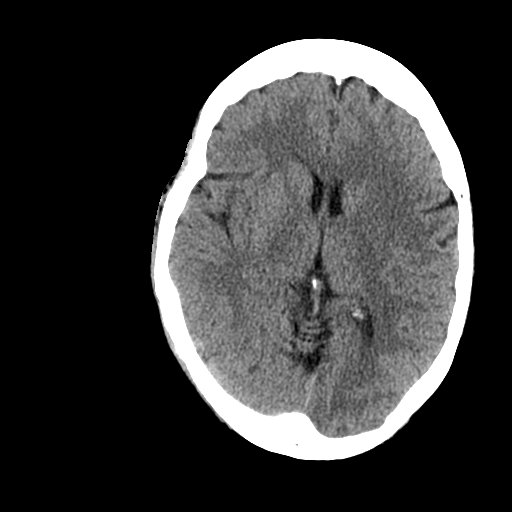
[im 17/33  bone]
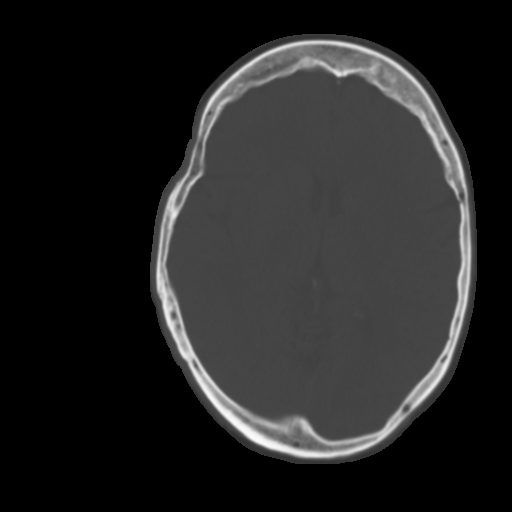
[im 20/33  brain]
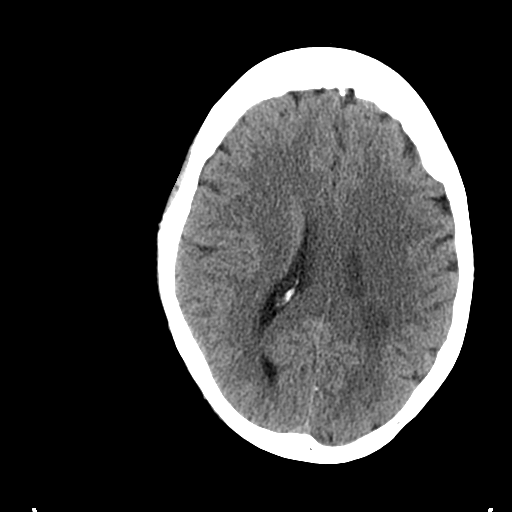
[im 23/33  brain]
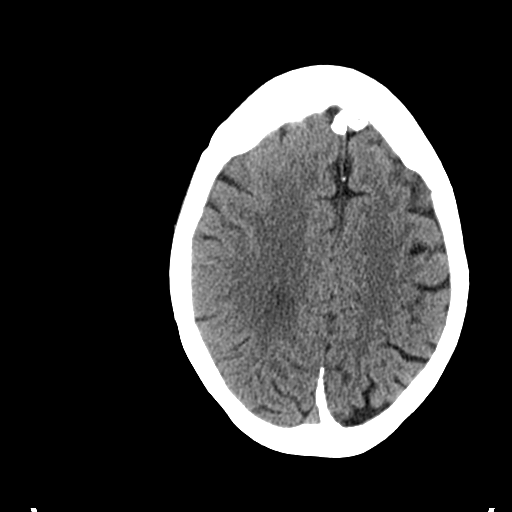
[im 26/33  brain]
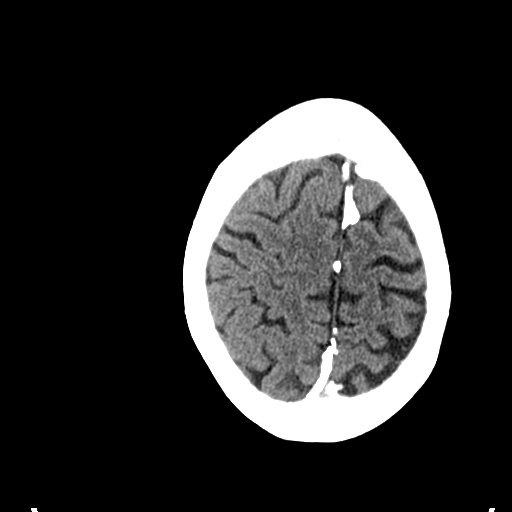
[im 29/33  brain]
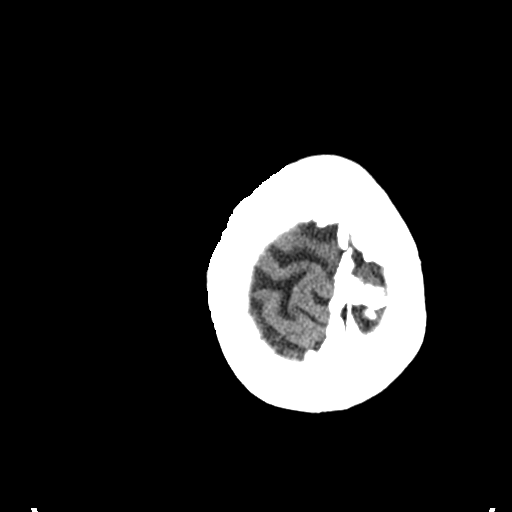
[im 29/33  bone]
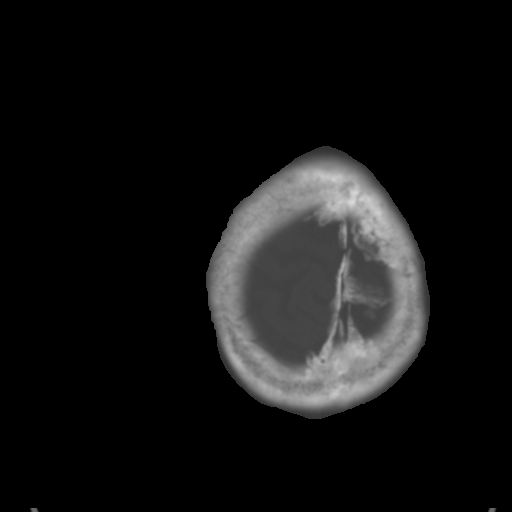

[Series 4: coronal · coronal · 0.30mm/px · 3 of 67 slices shown]
[im 23/67  brain]
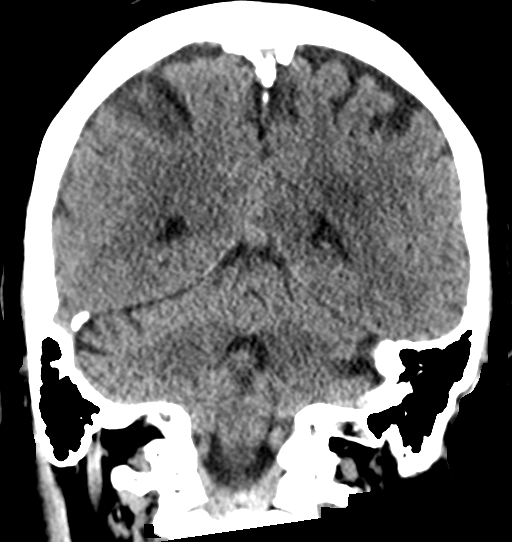
[im 30/67  brain]
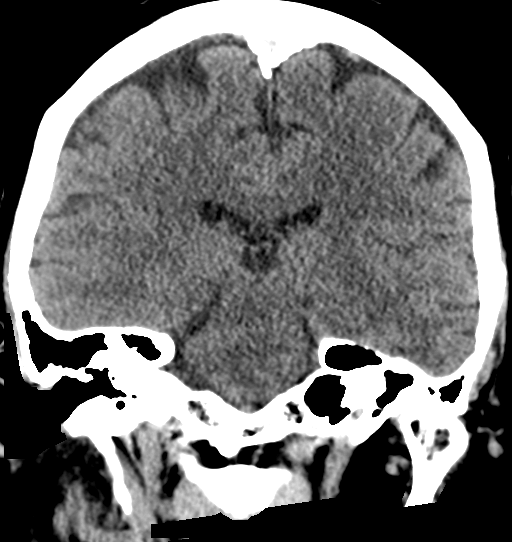
[im 37/67  brain]
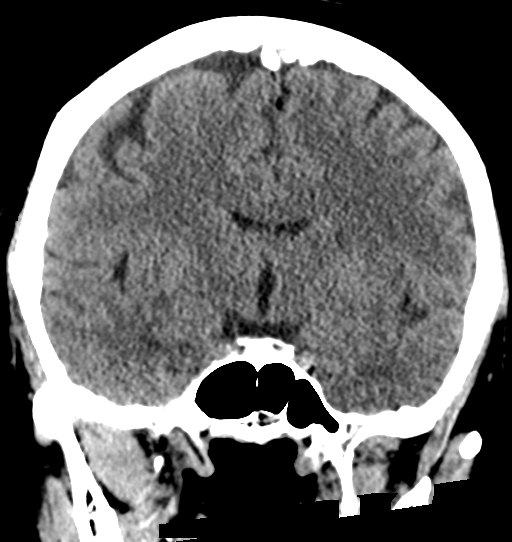

[Series 5: sagittal · sagittal · 0.32mm/px · 3 of 55 slices shown]
[im 19/55  brain]
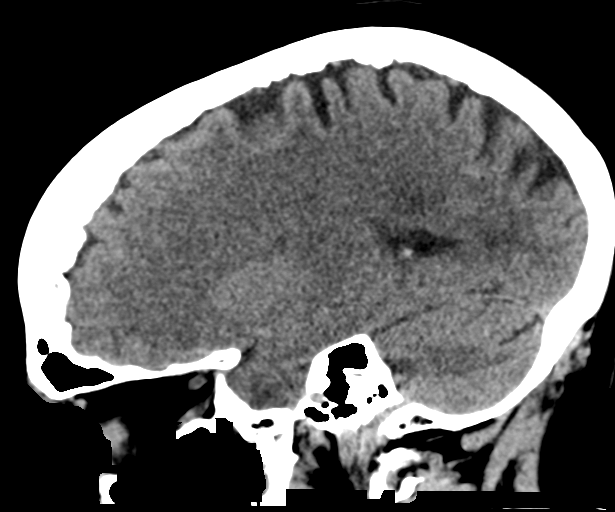
[im 28/55  brain]
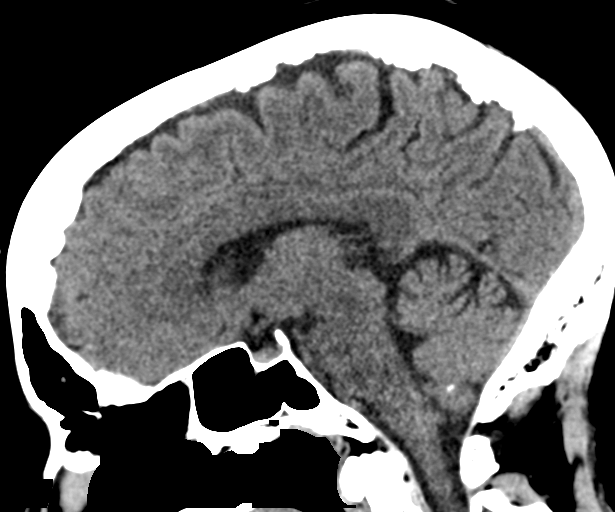
[im 37/55  brain]
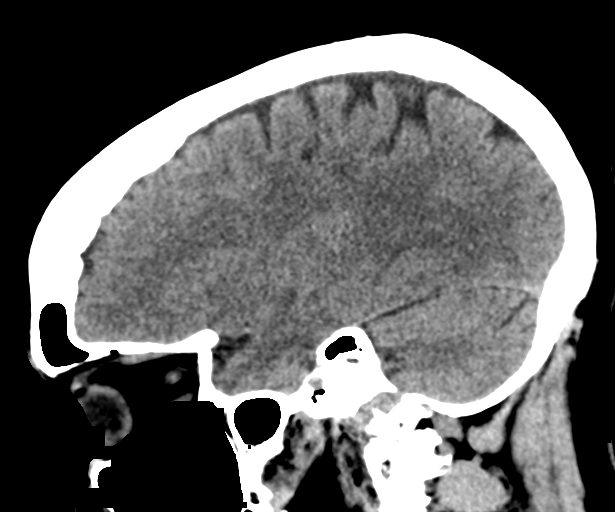

[15 of 47 positions shown; findings below may reference images not displayed]

FINDINGS: Brain: No evidence of acute infarction, hemorrhage, hydrocephalus,
extra-axial collection or mass lesion/mass effect.

Vascular: No hyperdense vessel or unexpected calcification.

Skull: Normal. Negative for fracture or focal lesion.

Sinuses/Orbits: No acute finding. Mild mucosal thickening of the
ethmoid sinus.

Other: None.
IMPRESSION: No acute intracranial abnormality.  No skull fracture noted.

## 2017-12-17 ENCOUNTER — Emergency Department (HOSPITAL_COMMUNITY)
Admission: EM | Admit: 2017-12-17 | Discharge: 2017-12-18 | Disposition: A | Discharge status: Home / Self Care | Payer: Medicare HMO | Attending: Emergency Medicine | Admitting: Emergency Medicine

## 2017-12-17 ENCOUNTER — Encounter (HOSPITAL_COMMUNITY): Payer: Self-pay

## 2017-12-17 ENCOUNTER — Emergency Department (HOSPITAL_COMMUNITY): Payer: Medicare HMO

## 2017-12-17 DIAGNOSIS — Z7984 Long term (current) use of oral hypoglycemic drugs: Secondary | ICD-10-CM | POA: Diagnosis not present

## 2017-12-17 DIAGNOSIS — I10 Essential (primary) hypertension: Secondary | ICD-10-CM | POA: Diagnosis not present

## 2017-12-17 DIAGNOSIS — M7989 Other specified soft tissue disorders: Secondary | ICD-10-CM | POA: Diagnosis not present

## 2017-12-17 DIAGNOSIS — Z9049 Acquired absence of other specified parts of digestive tract: Secondary | ICD-10-CM | POA: Diagnosis not present

## 2017-12-17 DIAGNOSIS — Z79899 Other long term (current) drug therapy: Secondary | ICD-10-CM | POA: Insufficient documentation

## 2017-12-17 DIAGNOSIS — M25571 Pain in right ankle and joints of right foot: Secondary | ICD-10-CM | POA: Diagnosis not present

## 2017-12-17 DIAGNOSIS — M10071 Idiopathic gout, right ankle and foot: Secondary | ICD-10-CM | POA: Insufficient documentation

## 2017-12-17 DIAGNOSIS — E119 Type 2 diabetes mellitus without complications: Secondary | ICD-10-CM | POA: Diagnosis not present

## 2017-12-17 DIAGNOSIS — R2241 Localized swelling, mass and lump, right lower limb: Secondary | ICD-10-CM | POA: Diagnosis not present

## 2017-12-17 MED ORDER — PREDNISONE 20 MG PO TABS
40.0000 mg | ORAL_TABLET | Freq: Once | ORAL | Status: AC
  Administered 2017-12-17: 40 mg via ORAL
  Filled 2017-12-17: qty 2

## 2017-12-17 MED ORDER — OXYCODONE-ACETAMINOPHEN 5-325 MG PO TABS
1.0000 | ORAL_TABLET | Freq: Once | ORAL | Status: AC
  Administered 2017-12-17: 1 via ORAL
  Filled 2017-12-17: qty 1

## 2017-12-17 NOTE — ED Triage Notes (Signed)
Pt complains of bilateral feet swelling, the right one is worse and she can't put much weight on it Pt is also short of breath and her heartrate is elevated

## 2017-12-17 NOTE — ED Provider Notes (Signed)
[facility] DEPT Provider Note   CSN: [id number] Arrival date & time: 12/17/17  2301     History   Chief Complaint Chief Complaint  Patient presents with  . bilateral feet swelling  . Shortness of Breath    HPI Frances Cabrera is a 72 y.o. female.  The history is provided by the patient.  She has history of diabetes, hypertension and comes in with pain and swelling of the right ankle which started today.  She states that the ankle and foot have been having mild swelling off and on for the last 2 weeks, and have been intermittently painful.  Today, pain is much worse and swelling is much worse.  Pain is rated at 10/10.  It is worse when she tries to put weight on it.  She has not had any swelling of the other foot.  She denies dyspnea.  Note is made of triage note stating patient short of breath and heart rate elevated.  Patient specifically denies these when I ask her.  She states that she was having difficulty walking because of pain in her foot, not because of dyspnea.  She has not checked her blood glucose recently, but states that she is compliant with medications.  She has not had pain and swelling like this before.  Past Medical History:  Diagnosis Date  . Diabetes mellitus   . Elevated LFTs   . Hypertension   . Menopause   . Microalbuminuria   . Obesity   . Recurrent UTI    post-coital    Patient Active Problem List   Diagnosis Date Noted  . Diabetes mellitus type 2 in obese ([place]) 05/30/2012  . HTN (hypertension) 05/30/2012  . Microalbuminuria   . Recurrent UTI   . BMI 35.0-35.9,adult   . Elevated LFTs     Past Surgical History:  Procedure Laterality Date  . CHOLECYSTECTOMY  2000  . TUBAL LIGATION      OB History    No data available       Home Medications    Prior to Admission medications   Medication Sig Start Date End Date Taking? Authorizing Provider  acetaminophen (TYLENOL) 500 MG tablet Take 750 mg by mouth  every 6 (six) hours as needed for moderate pain or headache. Reported on 05/31/2016    [provider]  Blood Glucose Monitoring Suppl (ACCU-CHEK AVIVA PLUS) w/Device KIT USE AS DIRECTED 01/02/17   Harrison Mons, PA-C  Blood Pressure Monitoring (B-D ASSURE BPM/AUTO WRIST CUFF) MISC Use to measure home blood pressure. 09/05/15   Harrison Mons, PA-C  cloNIDine (CATAPRES) 0.2 MG tablet Take 1 tablet (0.2 mg total) by mouth daily. 12/24/16   Jeffery, Chelle, PA-C  glucose blood test strip Use to test blood sugar once daily. E11.9 02/08/16   Harrison Mons, PA-C  hydrocortisone (PROCTO-PAK) 1 % CREA Apply as directed to symptomatic hemorrhoids Patient taking differently: Apply 1 application topically daily as needed (symptomatic hemorrhoids).  01/10/17   Harrison Mons, PA-C  Ketotifen Fumarate (ALAWAY OP) Place 1 drop into both eyes 2 (two) times daily.    [provider]  Lancet Device MISC Use for home glucose monitoring 02/08/16   Harrison Mons, PA-C  Lancets [facility] ULTRASOFT) lancets Use to test blood sugar once daily. E11.9 12/31/15   Harrison Mons, PA-C  Lancets Misc. (ACCU-CHEK SOFTCLIX LANCET DEV) KIT USE FOR HOME GLUCOSE MONITORING 01/02/17   Harrison Mons, PA-C  lisinopril (PRINIVIL,ZESTRIL) 20 MG tablet Take 1 tablet (20 mg total)  by mouth daily. OFFICE VISIT NEEDED FOR REFILLS 08/18/17   Harrison Mons, PA-C  LORazepam (ATIVAN) 1 MG tablet Take 1 tablet (1 mg total) by mouth every 6 (six) hours as needed for anxiety. 04/09/17   Harrison Mons, PA-C  metFORMIN (GLUCOPHAGE) 1000 MG tablet Take 1 tablet (1,000 mg total) by mouth 2 (two) times daily with a meal. 12/24/16   Jeffery, Chelle, PA-C  Multiple Vitamins-Minerals (MULTIVITAMIN WITH MINERALS) tablet Take 1 tablet by mouth daily. Centrum Silver    [provider]  pantoprazole (PROTONIX) 20 MG tablet TAKE 1 TABLET TWICE DAILY BEFORE A MEAL 05/28/17   Jeffery, Chelle, PA-C  triamterene-hydrochlorothiazide  (DYAZIDE) 37.5-25 MG capsule Take 1 each (1 capsule total) by mouth daily. 12/24/16   Harrison Mons, PA-C  verapamil (CALAN-SR) 240 MG CR tablet Take 1 tablet (240 mg total) by mouth 2 (two) times daily. 12/24/16   Harrison Mons, PA-C    Family History Family History  Problem Relation Age of Onset  . Diabetes Mother   . Hypertension Mother   . Kidney failure Mother   . Diabetes Father   . Diabetes Brother   . Diabetes Brother     Social History Social History   Tobacco Use  . Smoking status: Never Smoker  . Smokeless tobacco: Never Used  Substance Use Topics  . Alcohol use: No    Alcohol/week: 0.0 oz  . Drug use: No     Allergies   Penicillins and Shrimp [shellfish allergy]   Review of Systems Review of Systems  All other systems reviewed and are negative.    Physical Exam Updated Vital Signs BP (!) 185/84 (BP Location: Right Arm)   Pulse (!) 144   Temp 100.1 F (37.8 C) (Oral)   Resp 20   SpO2 96%   Physical Exam  Nursing note and vitals reviewed.  72 year old female, resting comfortably and in no acute distress. Vital signs are significant for hypertension and tachycardia and mildly elevated temperature which is not technically a fever. Oxygen saturation is 96%, which is normal. Head is normocephalic and atraumatic. PERRLA, EOMI. Oropharynx is clear. Neck is nontender and supple without adenopathy or JVD. Back is nontender and there is no CVA tenderness. Lungs are clear without rales, wheezes, or rhonchi. Chest is nontender. Heart has regular rate and rhythm without murmur. Abdomen is soft, flat, nontender without masses or hepatosplenomegaly and peristalsis is normoactive. Extremities: There is moderate swelling of the lateral aspect of the right ankle with moderate to severe tenderness over the same area.  There is 1+ pedal edema of the right foot.  There is no swelling of the calves and there is no swelling on the left and there is no tenderness on the  left.  Right ankle has moderate warmth laterally. Skin is warm and dry without rash. Neurologic: Mental status is normal, cranial nerves are intact, there are no motor or sensory deficits.  ED Treatments / Results  Labs (all labs ordered are listed, but only abnormal results are displayed) Labs Reviewed  BASIC METABOLIC PANEL - Abnormal; Notable for the following components:      Result Value   CO2 18 (*)    Glucose, Bld 156 (*)    Creatinine, Ser 1.03 (*)    GFR calc non Af Amer 54 (*)    Anion gap 17 (*)    All other components within normal limits  CBC WITH DIFFERENTIAL/PLATELET - Abnormal; Notable for the following components:   WBC 12.0 (*)  RBC 5.21 (*)    MCV 75.2 (*)    MCH 25.0 (*)    Neutro Abs 8.9 (*)    All other components within normal limits  URIC ACID - Abnormal; Notable for the following components:   Uric Acid, Serum 10.0 (*)    All other components within normal limits    Radiology Dg Ankle Complete Right  Result Date: 12/18/2017 CLINICAL DATA:  Pain and swelling laterally in the right ankle for 1 week. EXAM: RIGHT ANKLE - COMPLETE 3+ VIEW COMPARISON:  None. FINDINGS: Lateral soft tissue swelling over the right ankle. No evidence of acute fracture or dislocation. No focal bone lesion or bone destruction. Bone cortex appears intact. No radiopaque soft tissue foreign bodies or soft tissue gas collections. Calcaneal spurs. IMPRESSION: Lateral soft tissue swelling over the right ankle. No acute bony abnormalities. Electronically Signed   By: Lucienne Capers M.D.   On: 12/18/2017 00:14    Procedures Procedures (including critical care time)  Medications Ordered in ED Medications  predniSONE (DELTASONE) tablet 40 mg (40 mg Oral Given 12/17/17 2357)  oxyCODONE-acetaminophen (PERCOCET/ROXICET) 5-325 MG per tablet 1 tablet (1 tablet Oral Given 12/17/17 2358)     Initial Impression / Assessment and Plan / ED Course  I have reviewed the triage vital signs and the  nursing notes.  Pertinent labs & imaging results that were available during my care of the patient were reviewed by me and considered in my medical decision making (see chart for details).  Right ankle pain and swelling.  Pattern is most consistent with gout.  Swelling is asymmetric, making CHF unlikely.  No skin erythema to suggest cellulitis.  Old records are reviewed, and she does have a prior ED visit for leg swelling in 2017.  I find no record of uric acid level being checked in our system.  She will be given a dose of prednisone and oxycodone-acetaminophen.  Screening labs obtained as well as uric acid level.  Laboratory workup does show elevated uric acid level, consistent with gout.  X-ray shows soft tissue swelling of the lateral aspect of the ankle and no other acute changes.  She had good relief of pain with above-noted treatment.  Glucose is only mildly elevated at 156, so I do feel it is safe to give her a course of prednisone.  She is discharged with prescription for prednisone, 40 mg/day for 5 days.  Also given prescription for oxycodone-acetaminophen for pain.  Given crutches to use as needed.  As a separate issue, patient does not seem to have been monitoring her diabetes well at all.  She states it has been over 2 years since her last eye exam.  Importance of annual eye exam, and importance of home monitoring of glucose was stressed to the patient.  Final Clinical Impressions(s) / ED Diagnoses   Final diagnoses:  Acute idiopathic gout of right ankle    ED Discharge Orders        Ordered    oxyCODONE-acetaminophen (PERCOCET) 5-325 MG tablet  Every 4 hours PRN     12/18/17 0127    predniSONE (DELTASONE) 20 MG tablet  Daily     69/62/95 2841       Delora Fuel, MD 32/44/01 0139

## 2017-12-18 LAB — BASIC METABOLIC PANEL
Anion gap: 17 — ABNORMAL HIGH (ref 5–15)
BUN: 13 mg/dL (ref 6–20)
CO2: 18 mmol/L — ABNORMAL LOW (ref 22–32)
Calcium: 9.9 mg/dL (ref 8.9–10.3)
Chloride: 102 mmol/L (ref 101–111)
Creatinine, Ser: 1.03 mg/dL — ABNORMAL HIGH (ref 0.44–1.00)
GFR calc Af Amer: >60 mL/min (ref >60)
GFR calc non Af Amer: 54 mL/min — ABNORMAL LOW (ref >60)
Glucose, Bld: 156 mg/dL — ABNORMAL HIGH (ref 65–99)
Potassium: 3.7 mmol/L (ref 3.5–5.1)
Sodium: 137 mmol/L (ref 135–145)

## 2017-12-18 LAB — CBC WITH DIFFERENTIAL/PLATELET
Basophils Absolute: 0 10*3/uL (ref 0.0–0.1)
Basophils Relative: 0 %
Eosinophils Absolute: 0 10*3/uL (ref 0.0–0.7)
Eosinophils Relative: 0 %
HCT: 39.2 % (ref 36.0–46.0)
Hemoglobin: 13 g/dL (ref 12.0–15.0)
Lymphocytes Relative: 19 %
Lymphs Abs: 2.3 10*3/uL (ref 0.7–4.0)
MCH: 25 pg — ABNORMAL LOW (ref 26.0–34.0)
MCHC: 33.2 g/dL (ref 30.0–36.0)
MCV: 75.2 fL — ABNORMAL LOW (ref 78.0–100.0)
Monocytes Absolute: 0.7 10*3/uL (ref 0.1–1.0)
Monocytes Relative: 6 %
Neutro Abs: 8.9 10*3/uL — ABNORMAL HIGH (ref 1.7–7.7)
Neutrophils Relative %: 75 %
Platelets: 331 10*3/uL (ref 150–400)
RBC: 5.21 MIL/uL — ABNORMAL HIGH (ref 3.87–5.11)
RDW: 15.2 % (ref 11.5–15.5)
WBC: 12 10*3/uL — ABNORMAL HIGH (ref 4.0–10.5)

## 2017-12-18 LAB — URIC ACID: Uric Acid, Serum: 10 mg/dL — ABNORMAL HIGH (ref 2.3–6.6)

## 2017-12-18 MED ORDER — OXYCODONE-ACETAMINOPHEN 5-325 MG PO TABS: 1.0000 | ORAL_TABLET | ORAL | 0 refills | Status: DC | PRN

## 2017-12-18 MED ORDER — PREDNISONE 20 MG PO TABS: 40.0000 mg | ORAL_TABLET | Freq: Every day | ORAL | 0 refills | Status: DC

## 2017-12-18 NOTE — Discharge Instructions (Signed)
Please monitor your blood sugar while you are taking Prednisone - it will probably go up.  Once this attack is over, you need to discuss with your doctor whether to go on medication to prevent future attacks.  Please make sure to get annual eye exams. Diabetes is a major cause of blindness, and many problems can be treated if caught early.

## 2018-01-30 ENCOUNTER — Emergency Department (HOSPITAL_COMMUNITY)
Admission: EM | Admit: 2018-01-30 | Discharge: 2018-01-30 | Disposition: A | Discharge status: Home / Self Care | Payer: Medicare HMO | Attending: Emergency Medicine | Admitting: Emergency Medicine

## 2018-01-30 ENCOUNTER — Encounter (HOSPITAL_COMMUNITY): Payer: Self-pay | Admitting: *Deleted

## 2018-01-30 ENCOUNTER — Emergency Department (HOSPITAL_COMMUNITY): Payer: Medicare HMO

## 2018-01-30 ENCOUNTER — Other Ambulatory Visit: Payer: Self-pay

## 2018-01-30 DIAGNOSIS — J069 Acute upper respiratory infection, unspecified: Secondary | ICD-10-CM | POA: Diagnosis not present

## 2018-01-30 DIAGNOSIS — I10 Essential (primary) hypertension: Secondary | ICD-10-CM | POA: Insufficient documentation

## 2018-01-30 DIAGNOSIS — B9789 Other viral agents as the cause of diseases classified elsewhere: Secondary | ICD-10-CM | POA: Diagnosis not present

## 2018-01-30 DIAGNOSIS — R000 Tachycardia, unspecified: Secondary | ICD-10-CM | POA: Diagnosis not present

## 2018-01-30 DIAGNOSIS — Z7984 Long term (current) use of oral hypoglycemic drugs: Secondary | ICD-10-CM | POA: Diagnosis not present

## 2018-01-30 DIAGNOSIS — Z79899 Other long term (current) drug therapy: Secondary | ICD-10-CM | POA: Diagnosis not present

## 2018-01-30 DIAGNOSIS — E119 Type 2 diabetes mellitus without complications: Secondary | ICD-10-CM | POA: Diagnosis not present

## 2018-01-30 DIAGNOSIS — R05 Cough: Secondary | ICD-10-CM | POA: Diagnosis not present

## 2018-01-30 MED ORDER — ALBUTEROL SULFATE (2.5 MG/3ML) 0.083% IN NEBU: 5.0000 mg | INHALATION_SOLUTION | Freq: Once | RESPIRATORY_TRACT | Status: DC

## 2018-01-30 MED ORDER — DEXAMETHASONE 4 MG PO TABS
8.0000 mg | ORAL_TABLET | Freq: Once | ORAL | Status: AC
  Administered 2018-01-30: 8 mg via ORAL
  Filled 2018-01-30: qty 2

## 2018-01-30 NOTE — ED Notes (Signed)
[facility] EKG COMPLETED/ RESULTED AT 1152

## 2018-01-30 NOTE — ED Triage Notes (Signed)
Flu like symptoms since Thursday, Nasal congestion with yellow secretions. Coughing with green secretions, hoarseness,  Some loose stools with coughing

## 2018-01-30 NOTE — ED Notes (Signed)
PT AMBULATED IN HALLWAY. DENIES CHEST PAIN AND SHORTNESS OF BREATH. 02 SAT RA 98-99. HR 140-150. EDP KOHUT MADE AWARE OF CURRENT PT STATUS. PT IS OK TO DISCHARGE. PT IS READY TO GO HOME

## 2018-01-30 NOTE — ED Provider Notes (Addendum)
[facility] DEPT Provider Note   CSN: [id number] Arrival date & time: 01/30/18  1120     History   Chief Complaint Chief Complaint  Patient presents with  . Influenza    HPI Frances Cabrera is a 72 y.o. female.  HPI   72 year old female with cough and congestion.  Symptom onset 2 days ago.  Facial pressure, nasal congestion.  Cough productive for yellowish sputum.  No shortness of breath.  No fevers or chills.  No body aches.  Mild sore throat beginning today.  No urinary complaints.  Denies any sick contacts.  No unusual leg pain or swelling.   Past Medical History:  Diagnosis Date  . Diabetes mellitus   . Elevated LFTs   . Hypertension   . Menopause   . Microalbuminuria   . Obesity   . Recurrent UTI    post-coital    Patient Active Problem List   Diagnosis Date Noted  . Diabetes mellitus type 2 in obese ([place]) 05/30/2012  . HTN (hypertension) 05/30/2012  . Microalbuminuria   . Recurrent UTI   . BMI 35.0-35.9,adult   . Elevated LFTs     Past Surgical History:  Procedure Laterality Date  . CHOLECYSTECTOMY  2000  . TUBAL LIGATION      OB History    No data available       Home Medications    Prior to Admission medications   Medication Sig Start Date End Date Taking? Authorizing Provider  acetaminophen (TYLENOL) 500 MG tablet Take 750 mg by mouth every 6 (six) hours as needed for moderate pain or headache. Reported on 05/31/2016    [provider]  Blood Glucose Monitoring Suppl (ACCU-CHEK AVIVA PLUS) w/Device KIT USE AS DIRECTED 01/02/17   Harrison Mons, PA-C  Blood Pressure Monitoring (B-D ASSURE BPM/AUTO WRIST CUFF) MISC Use to measure home blood pressure. 09/05/15   Harrison Mons, PA-C  cloNIDine (CATAPRES) 0.2 MG tablet Take 1 tablet (0.2 mg total) by mouth daily. 12/24/16   Jeffery, Chelle, PA-C  glucose blood test strip Use to test blood sugar once daily. E11.9 02/08/16   Harrison Mons, PA-C    hydrocortisone (PROCTO-PAK) 1 % CREA Apply as directed to symptomatic hemorrhoids Patient taking differently: Apply 1 application topically daily as needed (symptomatic hemorrhoids).  01/10/17   Harrison Mons, PA-C  Ketotifen Fumarate (ALAWAY OP) Place 1 drop into both eyes 2 (two) times daily.    [provider]  Lancet Device MISC Use for home glucose monitoring 02/08/16   Harrison Mons, PA-C  Lancets [facility] ULTRASOFT) lancets Use to test blood sugar once daily. E11.9 12/31/15   Harrison Mons, PA-C  Lancets Misc. (ACCU-CHEK SOFTCLIX LANCET DEV) KIT USE FOR HOME GLUCOSE MONITORING 01/02/17   Harrison Mons, PA-C  lisinopril (PRINIVIL,ZESTRIL) 20 MG tablet Take 1 tablet (20 mg total) by mouth daily. OFFICE VISIT NEEDED FOR REFILLS 08/18/17   Harrison Mons, PA-C  LORazepam (ATIVAN) 1 MG tablet Take 1 tablet (1 mg total) by mouth every 6 (six) hours as needed for anxiety. 04/09/17   Harrison Mons, PA-C  metFORMIN (GLUCOPHAGE) 1000 MG tablet Take 1 tablet (1,000 mg total) by mouth 2 (two) times daily with a meal. 12/24/16   Jeffery, Chelle, PA-C  Multiple Vitamins-Minerals (MULTIVITAMIN WITH MINERALS) tablet Take 1 tablet by mouth daily. Centrum Silver    [provider]  oxyCODONE-acetaminophen (PERCOCET) 5-325 MG tablet Take 1 tablet by mouth every 4 (four) hours as needed for moderate pain. 12/18/17  Delora Fuel, MD  pantoprazole (PROTONIX) 20 MG tablet TAKE 1 TABLET TWICE DAILY BEFORE A MEAL 05/28/17   Jeffery, Chelle, PA-C  predniSONE (DELTASONE) 20 MG tablet Take 2 tablets (40 mg total) by mouth daily. 05/23/44   Delora Fuel, MD  triamterene-hydrochlorothiazide (DYAZIDE) 37.5-25 MG capsule Take 1 each (1 capsule total) by mouth daily. 12/24/16   Harrison Mons, PA-C  verapamil (CALAN-SR) 240 MG CR tablet Take 1 tablet (240 mg total) by mouth 2 (two) times daily. 12/24/16   Harrison Mons, PA-C    Family History Family History  Problem Relation Age of Onset  . Diabetes  Mother   . Hypertension Mother   . Kidney failure Mother   . Diabetes Father   . Diabetes Brother   . Diabetes Brother     Social History Social History   Tobacco Use  . Smoking status: Never Smoker  . Smokeless tobacco: Never Used  Substance Use Topics  . Alcohol use: No    Alcohol/week: 0.0 oz  . Drug use: No     Allergies   Penicillins and Shrimp [shellfish allergy]   Review of Systems Review of Systems  All systems reviewed and negative, other than as noted in HPI.  Physical Exam Updated Vital Signs BP (!) 191/101 (BP Location: Right Arm) Comment: Hasn't taken BP meds today  Pulse (!) 135 Comment: RN notified   Temp 98.7 F (37.1 C) (Oral)   Resp 20   Ht 5' 11"  (1.803 m)   Wt 110.2 kg (243 lb)   BMI 33.89 kg/m   Physical Exam  Constitutional: She appears well-developed and well-nourished. No distress.  HENT:  Head: Normocephalic and atraumatic.  Mouth/Throat: Oropharynx is clear and moist. No oropharyngeal exudate.  Eyes: Conjunctivae are normal. Right eye exhibits no discharge. Left eye exhibits no discharge.  Neck: Neck supple.  Cardiovascular: Regular rhythm and normal heart sounds. Exam reveals no gallop and no friction rub.  No murmur heard. Pulmonary/Chest: Effort normal and breath sounds normal. No respiratory distress.  Abdominal: Soft. She exhibits no distension. There is no tenderness.  Musculoskeletal: She exhibits no edema or tenderness.  Lower extremities symmetric as compared to each other. No calf tenderness. Negative Homan's. No palpable cords.   Neurological: She is alert.  Skin: Skin is warm and dry.  Psychiatric: She has a normal mood and affect. Her behavior is normal. Thought content normal.  Nursing note and vitals reviewed.    ED Treatments / Results  Labs (all labs ordered are listed, but only abnormal results are displayed) Labs Reviewed - No data to display  EKG  EKG Interpretation  Date/Time:  Saturday January 30 2018 11:51:19 EST Ventricular Rate:  127 PR Interval:    QRS Duration: 104 QT Interval:  435 QTC Calculation: 633 R Axis:   70 Text Interpretation:  Sinus tachycardia Abnormal inferior Q waves Prolonged QT interval Confirmed by Virgel Manifold [phone]) on 01/30/2018 4:13:25 PM       Radiology Dg Chest 2 View  Result Date: 01/30/2018 CLINICAL DATA:  Cough. EXAM: CHEST  2 VIEW COMPARISON:  11/26/2016 FINDINGS: The heart size and mediastinal contours are within normal limits. Both lungs are clear. The visualized skeletal structures are unremarkable. IMPRESSION: No active cardiopulmonary disease. Electronically Signed   By: Kerby Moors M.D.   On: 01/30/2018 12:28    Procedures Procedures (including critical care time)  Medications Ordered in ED Medications  albuterol (PROVENTIL) (2.5 MG/3ML) 0.083% nebulizer solution 5 mg (not administered)  Initial Impression / Assessment and Plan / ED Course  I have reviewed the triage vital signs and the nursing notes.  Pertinent labs & imaging results that were available during my care of the patient were reviewed by me and considered in my medical decision making (see chart for details).     71yF with URI symptoms. Cough/congestion/sore throat/hoarseness.  Suspect viral illness.  She sounds clear on auscultation.  Chest x-ray without acute abnormality. o2 sats not entered with triage vitals. I checked them myself and 99% on RA. She is tachycardic. Consider PE, but I doubt. Historically, she actually seems to run tachycardic. No signs/symptoms of DVT. C/o cough but denies dyspnea. No recent surgery, immobilization, CA, etc. Plan symptomatic tx. Return precautions discussed.    Vitals - 1 value per visit 01/30/2018 12/18/2017 12/17/2017 03/27/2017 03/18/2017  Pulse 134 103  84 99   Vitals - 1 value per visit 03/17/2017 02/19/2017 12/24/2016 11/26/2016 11/18/2016  Pulse  106 118 126 118   Vitals - 1 value per visit 11/05/2016 10/17/2016 05/31/2016  05/09/2016  Pulse 95 93 120 100   Vitals - 1 value per visit 11/28/2015 09/05/2015 05/04/2014 04/20/2013  Pulse 141 132 127 123   Vitals - 1 value per visit 03/11/2013 08/10/2012 05/30/2012  Pulse 118 114 114    Final Clinical Impressions(s) / ED Diagnoses   Final diagnoses:  Viral URI with cough    ED Discharge Orders    None       Virgel Manifold, MD 01/30/18 1705    Virgel Manifold, MD 01/30/18 1732

## 2018-01-30 NOTE — ED Notes (Signed)
DELAY WITH DISCHARGE- VITALS GIVEN AND ORDER FOR ADDITIONAL INFORMATION BEFORE SAFE TO DISCHARGE. PT AWARE

## 2018-02-10 ENCOUNTER — Other Ambulatory Visit: Payer: Self-pay | Admitting: Physician Assistant

## 2018-02-10 DIAGNOSIS — I10 Essential (primary) hypertension: Secondary | ICD-10-CM

## 2018-02-12 NOTE — Telephone Encounter (Signed)
Refills requested by pharmacy.  LOV 12/24/17 Called patient to schedule office visit. Refills completed.

## 2018-03-20 ENCOUNTER — Other Ambulatory Visit: Payer: Self-pay | Admitting: Physician Assistant

## 2018-03-20 DIAGNOSIS — E1169 Type 2 diabetes mellitus with other specified complication: Secondary | ICD-10-CM

## 2018-03-20 DIAGNOSIS — E669 Obesity, unspecified: Principal | ICD-10-CM

## 2018-12-08 ENCOUNTER — Emergency Department (HOSPITAL_COMMUNITY): Payer: Medicare HMO

## 2018-12-08 ENCOUNTER — Encounter (HOSPITAL_COMMUNITY): Payer: Self-pay

## 2018-12-08 ENCOUNTER — Emergency Department (HOSPITAL_COMMUNITY)
Admission: EM | Admit: 2018-12-08 | Discharge: 2018-12-08 | Disposition: A | Discharge status: Home / Self Care | Payer: Medicare HMO | Attending: Emergency Medicine | Admitting: Emergency Medicine

## 2018-12-08 ENCOUNTER — Other Ambulatory Visit: Payer: Self-pay

## 2018-12-08 DIAGNOSIS — I10 Essential (primary) hypertension: Secondary | ICD-10-CM | POA: Diagnosis not present

## 2018-12-08 DIAGNOSIS — E119 Type 2 diabetes mellitus without complications: Secondary | ICD-10-CM | POA: Insufficient documentation

## 2018-12-08 DIAGNOSIS — R0789 Other chest pain: Secondary | ICD-10-CM | POA: Insufficient documentation

## 2018-12-08 DIAGNOSIS — E669 Obesity, unspecified: Secondary | ICD-10-CM

## 2018-12-08 DIAGNOSIS — M10072 Idiopathic gout, left ankle and foot: Secondary | ICD-10-CM | POA: Diagnosis not present

## 2018-12-08 DIAGNOSIS — M109 Gout, unspecified: Secondary | ICD-10-CM | POA: Insufficient documentation

## 2018-12-08 DIAGNOSIS — Z7984 Long term (current) use of oral hypoglycemic drugs: Secondary | ICD-10-CM | POA: Insufficient documentation

## 2018-12-08 DIAGNOSIS — R079 Chest pain, unspecified: Secondary | ICD-10-CM

## 2018-12-08 DIAGNOSIS — R809 Proteinuria, unspecified: Secondary | ICD-10-CM

## 2018-12-08 DIAGNOSIS — E1169 Type 2 diabetes mellitus with other specified complication: Secondary | ICD-10-CM

## 2018-12-08 LAB — BASIC METABOLIC PANEL
Anion gap: 16 — ABNORMAL HIGH (ref 5–15)
BUN: 10 mg/dL (ref 8–23)
CO2: 22 mmol/L (ref 22–32)
Calcium: 9.4 mg/dL (ref 8.9–10.3)
Chloride: 98 mmol/L (ref 98–111)
Creatinine, Ser: 0.88 mg/dL (ref 0.44–1.00)
GFR calc Af Amer: >60 mL/min (ref >60)
GFR calc non Af Amer: >60 mL/min (ref >60)
Glucose, Bld: 225 mg/dL — ABNORMAL HIGH (ref 70–99)
Potassium: 3.2 mmol/L — ABNORMAL LOW (ref 3.5–5.1)
Sodium: 136 mmol/L (ref 135–145)

## 2018-12-08 LAB — CBC
HCT: 41.4 % (ref 36.0–46.0)
Hemoglobin: 12.7 g/dL (ref 12.0–15.0)
MCH: 24.3 pg — ABNORMAL LOW (ref 26.0–34.0)
MCHC: 30.7 g/dL (ref 30.0–36.0)
MCV: 79.3 fL — ABNORMAL LOW (ref 80.0–100.0)
Platelets: 298 10*3/uL (ref 150–400)
RBC: 5.22 MIL/uL — ABNORMAL HIGH (ref 3.87–5.11)
RDW: 15.5 % (ref 11.5–15.5)
WBC: 7.7 10*3/uL (ref 4.0–10.5)
nRBC: 0 % (ref 0.0–0.2)

## 2018-12-08 LAB — TROPONIN I
Troponin I: <0.03 ng/mL (ref <0.03)
Troponin I: <0.03 ng/mL (ref <0.03)

## 2018-12-08 LAB — D-DIMER, QUANTITATIVE (NOT AT ARMC): D-Dimer, Quant: 0.3 ug/mL-FEU (ref 0.00–0.50)

## 2018-12-08 MED ORDER — MORPHINE SULFATE (PF) 4 MG/ML IV SOLN
4.0000 mg | Freq: Once | INTRAVENOUS | Status: DC
  Filled 2018-12-08: qty 1

## 2018-12-08 MED ORDER — LISINOPRIL 20 MG PO TABS
20.0000 mg | ORAL_TABLET | Freq: Once | ORAL | Status: AC
  Administered 2018-12-08: 20 mg via ORAL
  Filled 2018-12-08: qty 1

## 2018-12-08 MED ORDER — METFORMIN HCL 1000 MG PO TABS: 1000.0000 mg | ORAL_TABLET | Freq: Two times a day (BID) | ORAL | 0 refills | Status: DC

## 2018-12-08 MED ORDER — ASPIRIN 81 MG PO CHEW
324.0000 mg | CHEWABLE_TABLET | Freq: Once | ORAL | Status: AC
  Administered 2018-12-08: 324 mg via ORAL
  Filled 2018-12-08: qty 4

## 2018-12-08 MED ORDER — LISINOPRIL 20 MG PO TABS: 20.0000 mg | ORAL_TABLET | Freq: Every day | ORAL | 0 refills | Status: DC

## 2018-12-08 MED ORDER — PREDNISONE 20 MG PO TABS: 40.0000 mg | ORAL_TABLET | Freq: Every day | ORAL | 0 refills | Status: DC

## 2018-12-08 MED ORDER — IBUPROFEN 200 MG PO TABS
400.0000 mg | ORAL_TABLET | Freq: Once | ORAL | Status: AC
  Administered 2018-12-08: 400 mg via ORAL
  Filled 2018-12-08: qty 2

## 2018-12-08 NOTE — ED Notes (Signed)
RX X 3 GIVEN 

## 2018-12-08 NOTE — ED Notes (Signed)
ED Provider at bedside. EDP J KNAPP 

## 2018-12-08 NOTE — ED Provider Notes (Signed)
[facility] DEPT Provider Note   CSN: [id number] Arrival date & time: 12/08/18  1017     History   Chief Complaint Chief Complaint  Patient presents with  . Chest Pain    HPI Frances Cabrera is a 72 y.o. female.  HPI Patient presents to the emergency room for evaluation of chest discomfort.  Patient states she started having chest discomfort couple of hours ago.  It is a nagging discomfort in the center of her chest.  Patient states it is not very severe.  She is not have any nausea shortness of breath or radiating pain.  Patient has had some discomfort in her left foot and ankle.  She attributes this to a gout flare.  Patient states she was told previously when she was seen in the emergency room that she had gout.  It helped when she took the medications but that ran out and her symptoms started to return.  Patient thought her chest discomfort might be related to her gout symptoms in her left ankle.  Patient does not have any history of heart disease or pulmonary embolism.  She does not smoke. Past Medical History:  Diagnosis Date  . Diabetes mellitus   . Elevated LFTs   . Hypertension   . Menopause   . Microalbuminuria   . Obesity   . Recurrent UTI    post-coital    Patient Active Problem List   Diagnosis Date Noted  . Diabetes mellitus type 2 in obese ([place]) 05/30/2012  . HTN (hypertension) 05/30/2012  . Microalbuminuria   . Recurrent UTI   . BMI 35.0-35.9,adult   . Elevated LFTs     Past Surgical History:  Procedure Laterality Date  . CHOLECYSTECTOMY  2000  . TUBAL LIGATION       OB History   No obstetric history on file.      Home Medications    Prior to Admission medications   Medication Sig Start Date End Date Taking? Authorizing Provider  cloNIDine (CATAPRES) 0.2 MG tablet TAKE 1 TABLET EVERY DAY 02/12/18  Yes Jeffery, Chelle, PA  Ketotifen Fumarate (ALAWAY OP) Place 1 drop into both eyes 2 (two) times daily.    Yes [provider]  Multiple Vitamins-Minerals (MULTIVITAMIN WITH MINERALS) tablet Take 1 tablet by mouth daily. Centrum Silver   Yes [provider]  triamterene-hydrochlorothiazide (DYAZIDE) 37.5-25 MG capsule TAKE 1 CAPSULE EVERY DAY 02/12/18  Yes Jeffery, Chelle, PA  verapamil (CALAN-SR) 240 MG CR tablet TAKE 1 TABLET TWICE DAILY 02/12/18  Yes Jeffery, Chelle, PA  acetaminophen (TYLENOL) 500 MG tablet Take 750 mg by mouth every 6 (six) hours as needed for moderate pain or headache. Reported on 05/31/2016    [provider]  Blood Glucose Monitoring Suppl (ACCU-CHEK AVIVA PLUS) w/Device KIT USE AS DIRECTED 01/02/17   Harrison Mons, PA  Blood Pressure Monitoring (B-D ASSURE BPM/AUTO WRIST CUFF) MISC Use to measure home blood pressure. 09/05/15   Harrison Mons, PA  glucose blood test strip Use to test blood sugar once daily. E11.9 02/08/16   Harrison Mons, PA  hydrocortisone (PROCTO-PAK) 1 % CREA Apply as directed to symptomatic hemorrhoids Patient taking differently: Apply 1 application topically daily as needed (symptomatic hemorrhoids).  01/10/17   Harrison Mons, PA  Lancet Device MISC Use for home glucose monitoring 02/08/16   Harrison Mons, PA  Lancets [facility] ULTRASOFT) lancets Use to test blood sugar once daily. E11.9 12/31/15   Harrison Mons, PA  Lancets Misc. [facility] LANCET  DEV) KIT USE FOR HOME GLUCOSE MONITORING 01/02/17   Harrison Mons, PA  lisinopril (PRINIVIL,ZESTRIL) 20 MG tablet Take 1 tablet (20 mg total) by mouth daily. OFFICE VISIT NEEDED FOR REFILLS 12/08/18   Dorie Rank, MD  LORazepam (ATIVAN) 1 MG tablet Take 1 tablet (1 mg total) by mouth every 6 (six) hours as needed for anxiety. Patient not taking: Reported on 12/08/2018 04/09/17   Harrison Mons, PA  metFORMIN (GLUCOPHAGE) 1000 MG tablet Take 1 tablet (1,000 mg total) by mouth 2 (two) times daily. 12/08/18   Dorie Rank, MD  oxyCODONE-acetaminophen (PERCOCET) 5-325 MG tablet Take  1 tablet by mouth every 4 (four) hours as needed for moderate pain. Patient not taking: Reported on 32/11/2481 5/0/03   Delora Fuel, MD  pantoprazole (PROTONIX) 20 MG tablet TAKE 1 TABLET TWICE DAILY BEFORE A MEAL Patient not taking: Reported on 12/08/2018 03/22/18   Harrison Mons, PA  predniSONE (DELTASONE) 20 MG tablet Take 2 tablets (40 mg total) by mouth daily. 12/08/18   Dorie Rank, MD    Family History Family History  Problem Relation Age of Onset  . Diabetes Mother   . Hypertension Mother   . Kidney failure Mother   . Diabetes Father   . Diabetes Brother   . Diabetes Brother     Social History Social History   Tobacco Use  . Smoking status: Never Smoker  . Smokeless tobacco: Never Used  Substance Use Topics  . Alcohol use: No    Alcohol/week: 0.0 standard drinks  . Drug use: No     Allergies   Penicillins and Shrimp [shellfish allergy]   Review of Systems Review of Systems  All other systems reviewed and are negative.    Physical Exam Updated Vital Signs BP (!) 181/89   Pulse 90   Temp 98.6 F (37 C) (Oral)   Resp (!) 21   Ht 1.803 m (_0 )   Wt 104.3 kg   SpO2 97%   BMI 32.08 kg/m   Physical Exam Vitals signs and nursing note reviewed.  Constitutional:      General: She is not in acute distress.    Appearance: She is well-developed.  HENT:     Head: Normocephalic and atraumatic.     Right Ear: External ear normal.     Left Ear: External ear normal.  Eyes:     General: No scleral icterus.       Right eye: No discharge.        Left eye: No discharge.     Conjunctiva/sclera: Conjunctivae normal.  Neck:     Musculoskeletal: Neck supple.     Trachea: No tracheal deviation.  Cardiovascular:     Rate and Rhythm: Regular rhythm. Tachycardia present.  Pulmonary:     Effort: Pulmonary effort is normal. No respiratory distress.     Breath sounds: Normal breath sounds. No stridor. No wheezing or rales.  Abdominal:     General: Bowel sounds  are normal. There is no distension.     Palpations: Abdomen is soft.     Tenderness: There is no abdominal tenderness. There is no guarding or rebound.  Musculoskeletal:        General: No tenderness.     Comments: No calf edema or swelling bilaterally, mild edema around the left ankle and left foot, no erythema or effusions, normal cap refill bilaterally  Skin:    General: Skin is warm and dry.     Findings: No rash.  Neurological:  Mental Status: She is alert.     Cranial Nerves: No cranial nerve deficit (no facial droop, extraocular movements intact, no slurred speech).     Sensory: No sensory deficit.     Motor: No abnormal muscle tone or seizure activity.     Coordination: Coordination normal.      ED Treatments / Results  Labs (all labs ordered are listed, but only abnormal results are displayed) Labs Reviewed  BASIC METABOLIC PANEL - Abnormal; Notable for the following components:      Result Value   Potassium 3.2 (*)    Glucose, Bld 225 (*)    Anion gap 16 (*)    All other components within normal limits  CBC - Abnormal; Notable for the following components:   RBC 5.22 (*)    MCV 79.3 (*)    MCH 24.3 (*)    All other components within normal limits  TROPONIN I  TROPONIN I  D-DIMER, QUANTITATIVE (NOT AT [facility])    EKG EKG Interpretation  Date/Time:  Wednesday December 08 2018 09:47:05 EST Ventricular Rate:  124 PR Interval:    QRS Duration: 100 QT Interval:  338 QTC Calculation: 486 R Axis:   70 Text Interpretation:  Sinus tachycardia Prolonged PR interval Consider right atrial enlargement Low voltage, precordial leads Nonspecific T abnormalities, diffuse leads Borderline prolonged QT interval Since last tracing rate faster Confirmed by Dorie Rank [phone]) on 12/08/2018 9:56:11 AM   Radiology Dg Chest Portable 1 View  Result Date: 12/08/2018 CLINICAL DATA:  Chest pain. EXAM: PORTABLE CHEST 1 VIEW COMPARISON:  01/30/2018 FINDINGS: The heart size and  mediastinal contours are within normal limits. There is no evidence of pulmonary edema, consolidation, pneumothorax, nodule or pleural fluid. The visualized skeletal structures are unremarkable. IMPRESSION: No active disease. Electronically Signed   By: Aletta Edouard M.D.   On: 12/08/2018 10:14    Procedures Procedures (including critical care time)  Medications Ordered in ED Medications  morphine 4 MG/ML injection 4 mg (4 mg Intravenous Refused 12/08/18 1150)  lisinopril (PRINIVIL,ZESTRIL) tablet 20 mg (has no administration in time range)  aspirin chewable tablet 324 mg (324 mg Oral Given 12/08/18 1021)  ibuprofen (ADVIL,MOTRIN) tablet 400 mg (400 mg Oral Given 12/08/18 1150)     Initial Impression / Assessment and Plan / ED Course  I have reviewed the triage vital signs and the nursing notes.  Pertinent labs & imaging results that were available during my care of the patient were reviewed by me and considered in my medical decision making (see chart for details).  Clinical Course as of Dec 08 1448  Wed Dec 08, 2018  1417 Pulse rate improved . BP better.  Delta trop negative   [JK]    Clinical Course User Index [JK] Dorie Rank, MD    Pt presented with a primary complaint of ankle pain.  Pt has a history of gout.  Not currently on any medications.  Exam not suggestive of DVT, acute vascular compromise or infection.  No trauma.  Suspect gout flare.  Pt also had chest pain.  Noted to be tachycardic.  No history of heart disease but she does have HTN and DM.  ED workup reassuring.  EKG without nonspecific findings.  Serial troponin negative.  Tachycardia improved with pain management (morphine ordered but pt only wanted otc meds).  Doubt PE.  Moderate risk per heart score but overall doubt ACS.  Pt wonders if she could have been having some acid reflux issues.  She  did have onion rings and hamburgers last night. Pt is feeling better and would like to go home for the holiday.  I will  refill her BP and DM meds.  DC home with steroids for gout.  Pt to monitor her blood sugar.  Discussed outpt follow up.  Return to the ED for recurrent sx  Final Clinical Impressions(s) / ED Diagnoses   Final diagnoses:  Hypertension, unspecified type  Acute gout of left ankle, unspecified cause  Chest pain, unspecified type    ED Discharge Orders         Ordered    predniSONE (DELTASONE) 20 MG tablet  Daily     12/08/18 1444    metFORMIN (GLUCOPHAGE) 1000 MG tablet  2 times daily     12/08/18 1444    lisinopril (PRINIVIL,ZESTRIL) 20 MG tablet  Daily     12/08/18 1444           Dorie Rank, MD 12/08/18 1449

## 2018-12-08 NOTE — ED Notes (Signed)
PT STATES SHE HAS NOT TAKEN HER BP MEDICATIONS THIS AM

## 2018-12-08 NOTE — ED Triage Notes (Signed)
Pt states left foot swelling and pain. Pt states that she was recently dx with gout. Pt states that she has "the shakes" and the foot pain is causing chest pain.

## 2018-12-08 NOTE — Discharge Instructions (Signed)
Take the medications as prescribed, follow up with a primary care doctor or consider seeing a cardiologist, return to the ED for recurrent sx

## 2019-02-25 ENCOUNTER — Emergency Department (HOSPITAL_COMMUNITY): Payer: Medicare HMO

## 2019-02-25 ENCOUNTER — Emergency Department (HOSPITAL_COMMUNITY)
Admission: EM | Admit: 2019-02-25 | Discharge: 2019-02-25 | Disposition: A | Discharge status: Home / Self Care | Payer: Medicare HMO | Attending: Emergency Medicine | Admitting: Emergency Medicine

## 2019-02-25 ENCOUNTER — Other Ambulatory Visit: Payer: Self-pay

## 2019-02-25 ENCOUNTER — Encounter (HOSPITAL_COMMUNITY): Payer: Self-pay

## 2019-02-25 DIAGNOSIS — Z760 Encounter for issue of repeat prescription: Secondary | ICD-10-CM | POA: Insufficient documentation

## 2019-02-25 DIAGNOSIS — M79672 Pain in left foot: Secondary | ICD-10-CM | POA: Diagnosis not present

## 2019-02-25 DIAGNOSIS — Z7984 Long term (current) use of oral hypoglycemic drugs: Secondary | ICD-10-CM | POA: Insufficient documentation

## 2019-02-25 DIAGNOSIS — E119 Type 2 diabetes mellitus without complications: Secondary | ICD-10-CM | POA: Insufficient documentation

## 2019-02-25 DIAGNOSIS — I10 Essential (primary) hypertension: Secondary | ICD-10-CM | POA: Insufficient documentation

## 2019-02-25 DIAGNOSIS — M109 Gout, unspecified: Secondary | ICD-10-CM | POA: Insufficient documentation

## 2019-02-25 LAB — BASIC METABOLIC PANEL
Anion gap: 14 (ref 5–15)
BUN: 11 mg/dL (ref 8–23)
CO2: 22 mmol/L (ref 22–32)
Calcium: 9.5 mg/dL (ref 8.9–10.3)
Chloride: 101 mmol/L (ref 98–111)
Creatinine, Ser: 0.75 mg/dL (ref 0.44–1.00)
GFR calc Af Amer: >60 mL/min (ref >60)
GFR calc non Af Amer: >60 mL/min (ref >60)
Glucose, Bld: 280 mg/dL — ABNORMAL HIGH (ref 70–99)
Potassium: 3.4 mmol/L — ABNORMAL LOW (ref 3.5–5.1)
Sodium: 137 mmol/L (ref 135–145)

## 2019-02-25 LAB — POCT I-STAT TROPONIN I: Troponin i, poc: 0.02 ng/mL (ref 0.00–0.08)

## 2019-02-25 LAB — CBC
HCT: 39.4 % (ref 36.0–46.0)
Hemoglobin: 12 g/dL (ref 12.0–15.0)
MCH: 24.6 pg — ABNORMAL LOW (ref 26.0–34.0)
MCHC: 30.5 g/dL (ref 30.0–36.0)
MCV: 80.9 fL (ref 80.0–100.0)
Platelets: 305 10*3/uL (ref 150–400)
RBC: 4.87 MIL/uL (ref 3.87–5.11)
RDW: 14.9 % (ref 11.5–15.5)
WBC: 6.3 10*3/uL (ref 4.0–10.5)
nRBC: 0 % (ref 0.0–0.2)

## 2019-02-25 MED ORDER — VERAPAMIL HCL ER 240 MG PO TBCR: 240.0000 mg | EXTENDED_RELEASE_TABLET | Freq: Two times a day (BID) | ORAL | 0 refills | Status: DC

## 2019-02-25 MED ORDER — LISINOPRIL 20 MG PO TABS: 20.0000 mg | ORAL_TABLET | Freq: Every day | ORAL | 0 refills | Status: DC

## 2019-02-25 MED ORDER — TRIAMTERENE-HCTZ 37.5-25 MG PO TABS: 1.0000 | ORAL_TABLET | Freq: Every day | ORAL | 0 refills | Status: DC

## 2019-02-25 MED ORDER — METFORMIN HCL 1000 MG PO TABS: 1000.0000 mg | ORAL_TABLET | Freq: Two times a day (BID) | ORAL | 0 refills | Status: DC

## 2019-02-25 MED ORDER — CLONIDINE HCL 0.2 MG PO TABS: 0.2000 mg | ORAL_TABLET | Freq: Every day | ORAL | 0 refills | Status: DC

## 2019-02-25 MED ORDER — SODIUM CHLORIDE 0.9% FLUSH: 3.0000 mL | Freq: Once | INTRAVENOUS | Status: DC

## 2019-02-25 MED ORDER — PREDNISONE 20 MG PO TABS: 40.0000 mg | ORAL_TABLET | Freq: Every day | ORAL | 0 refills | Status: AC

## 2019-02-25 NOTE — ED Triage Notes (Signed)
Patient reports that she has left foot swelling and is out of all meds. Patient states she has been out of her meds x 4 days.

## 2019-02-25 NOTE — ED Notes (Signed)
Patient told Registration that she is waiting in her car. Will attempt to call again.

## 2019-02-25 NOTE — ED Notes (Signed)
Bed: WA07 Expected date:  Expected time:  Means of arrival:  Comments: triage 

## 2019-02-25 NOTE — ED Provider Notes (Signed)
[facility] DEPT Provider Note   CSN: [id number] Arrival date & time: 02/25/19  1117    History   Chief Complaint Chief Complaint  Patient presents with  . Foot Swelling  . Medication Refill    HPI    Frances Cabrera is a 73 y.o. female with a PMHx of DM2, HTN, gout, and other conditions listed below, who presents to the ED with complaints of left foot and ankle pain and swelling intermittently for the last week which she thinks is due to gout.  She was seen in the ED in the past, once on 12/17/2017 at which time her uric acid level was 10, she was treated for gout using prednisone.  Most recently in December 2019 she was also treated for gout using prednisone.  She is not on anything chronically for gout.  She states that intermittently over the last week she has had some pain and swelling in the left foot and ankle, she describes this pain as 8/10 intermittent aching nonradiating pain in the foot and ankle, worse with ambulation, and unrelieved with ibuprofen.  She denies any injury or trauma to the foot.  She also denies any erythema or warmth.  She also mentions that she does not have a PCP at this time so she has been out of her medications, she has been taking some of them less frequently than prescribed to make them last longer.  Her medications include verapamil 250 mg twice daily, lisinopril 20 mg daily, clonidine 0.54m daily, and metformin 10041mBID all of which she took yesterday.  She also takes triamterene-HCTZ 37.5-25 every day which she took on Wednesday.  She has not had any of these medications today.  She denies any fevers, chills, or erythema or warmth to the foot, chest pain, shortness of breath, leg swelling, abdominal pain, nausea, vomiting, diarrhea, constipation, dysuria, hematuria, numbness, tingling, focal weakness, headache, vision changes, or any other complaints at this time.  Of note, she mentions that she always has a high heart  rate when she comes to the ER, but when she calms down a usually goes down.  The history is provided by the patient and medical records. No language interpreter was used.  Medication Refill    Past Medical History:  Diagnosis Date  . Diabetes mellitus   . Elevated LFTs   . Hypertension   . Menopause   . Microalbuminuria   . Obesity   . Recurrent UTI    post-coital    Patient Active Problem List   Diagnosis Date Noted  . [facility] [place] type 2 in [place]  . HTN (hypertension) 05/30/2012  . Microalbuminuria   . Recurrent UTI   . BMI 35.0-35.9,adult   . Elevated LFTs     Past Surgical History:  Procedure Laterality Date  . CHOLECYSTECTOMY  2000  . TUBAL LIGATION       OB History   No obstetric history on file.      Home Medications    Prior to Admission medications   Medication Sig Start Date End Date Taking? Authorizing Provider  acetaminophen (TYLENOL) 500 MG tablet Take 750 mg by mouth every 6 (six) hours as needed for moderate pain or headache. Reported on 05/31/2016    [provider]  Blood Glucose Monitoring Suppl (ACCU-CHEK AVIVA PLUS) w/Device KIT USE AS DIRECTED 01/02/17   JeHarrison MonsPA  Blood Pressure Monitoring (B-D ASSURE BPM/AUTO WRIST CUFF) MISC Use to measure home blood pressure. 09/05/15  Harrison Mons, PA  cloNIDine (CATAPRES) 0.2 MG tablet TAKE 1 TABLET EVERY DAY 02/12/18   Jeffery, Chelle, PA  glucose blood test strip Use to test blood sugar once daily. E11.9 02/08/16   Harrison Mons, PA  hydrocortisone (PROCTO-PAK) 1 % CREA Apply as directed to symptomatic hemorrhoids Patient taking differently: Apply 1 application topically daily as needed (symptomatic hemorrhoids).  01/10/17   Harrison Mons, PA  Ketotifen Fumarate (ALAWAY OP) Place 1 drop into both eyes 2 (two) times daily.    [provider]  Lancet Device MISC Use for home glucose monitoring 02/08/16   Harrison Mons, PA  Lancets [facility] ULTRASOFT)  lancets Use to test blood sugar once daily. E11.9 12/31/15   Harrison Mons, PA  Lancets Misc. (ACCU-CHEK SOFTCLIX LANCET DEV) KIT USE FOR HOME GLUCOSE MONITORING 01/02/17   Harrison Mons, PA  lisinopril (PRINIVIL,ZESTRIL) 20 MG tablet Take 1 tablet (20 mg total) by mouth daily. OFFICE VISIT NEEDED FOR REFILLS 12/08/18   Dorie Rank, MD  LORazepam (ATIVAN) 1 MG tablet Take 1 tablet (1 mg total) by mouth every 6 (six) hours as needed for anxiety. Patient not taking: Reported on 12/08/2018 04/09/17   Harrison Mons, PA  metFORMIN (GLUCOPHAGE) 1000 MG tablet Take 1 tablet (1,000 mg total) by mouth 2 (two) times daily. 12/08/18   Dorie Rank, MD  Multiple Vitamins-Minerals (MULTIVITAMIN WITH MINERALS) tablet Take 1 tablet by mouth daily. Centrum Silver    [provider]  oxyCODONE-acetaminophen (PERCOCET) 5-325 MG tablet Take 1 tablet by mouth every 4 (four) hours as needed for moderate pain. Patient not taking: Reported on 24/40/1027 01/19/35   Delora Fuel, MD  pantoprazole (PROTONIX) 20 MG tablet TAKE 1 TABLET TWICE DAILY BEFORE A MEAL Patient not taking: Reported on 12/08/2018 03/22/18   Harrison Mons, PA  predniSONE (DELTASONE) 20 MG tablet Take 2 tablets (40 mg total) by mouth daily. 12/08/18   Dorie Rank, MD  triamterene-hydrochlorothiazide (DYAZIDE) 37.5-25 MG capsule TAKE 1 CAPSULE EVERY DAY 02/12/18   Harrison Mons, PA  verapamil (CALAN-SR) 240 MG CR tablet TAKE 1 TABLET TWICE DAILY 02/12/18   Harrison Mons, PA    Family History Family History  Problem Relation Age of Onset  . Diabetes Mother   . Hypertension Mother   . Kidney failure Mother   . Diabetes Father   . Diabetes Brother   . Diabetes Brother     Social History Social History   Tobacco Use  . Smoking status: Never Smoker  . Smokeless tobacco: Never Used  Substance Use Topics  . Alcohol use: No    Alcohol/week: 0.0 standard drinks  . Drug use: No     Allergies   Penicillins and Shrimp [shellfish allergy]    Review of Systems Review of Systems  Constitutional: Negative for chills and fever.  Eyes: Negative for visual disturbance.  Respiratory: Negative for shortness of breath.   Cardiovascular: Negative for chest pain and leg swelling.  Gastrointestinal: Negative for abdominal pain, constipation, diarrhea, nausea and vomiting.  Genitourinary: Negative for dysuria and hematuria.  Musculoskeletal: Positive for arthralgias and joint swelling.  Skin: Negative for color change.  Allergic/Immunologic: Positive for immunocompromised state (DM2).  Neurological: Negative for weakness, numbness and headaches.  Psychiatric/Behavioral: Negative for confusion.   All other systems reviewed and are negative for acute change except as noted in the HPI.    Physical Exam Updated Vital Signs BP (!) 250/113 (BP Location: Left Arm)   Pulse (!) 126   Temp 98.9 F (37.2 C) (  Oral)   Resp 19   Ht 5' 11"  (1.803 m)   Wt 104.3 kg   SpO2 100%   BMI 32.08 kg/m     Physical Exam Vitals signs and nursing note reviewed.  Constitutional:      General: She is not in acute distress.    Appearance: Normal appearance. She is well-developed. She is not toxic-appearing.     Comments: Afebrile, nontoxic, NAD, elevated BP similar to prior visits, HR 110s similar to prior visits  HENT:     Head: Normocephalic and atraumatic.  Eyes:     General:        Right eye: No discharge.        Left eye: No discharge.     Conjunctiva/sclera: Conjunctivae normal.  Neck:     Musculoskeletal: Normal range of motion and neck supple.  Cardiovascular:     Rate and Rhythm: Regular rhythm. Tachycardia present.     Pulses: Normal pulses.     Heart sounds: Normal heart sounds, S1 normal and S2 normal. No murmur. No friction rub. No gallop.      Comments: HR 110s during exam, similar to prior visits Pulmonary:     Effort: Pulmonary effort is normal. No respiratory distress.     Breath sounds: Normal breath sounds. No decreased  breath sounds, wheezing, rhonchi or rales.  Abdominal:     General: Bowel sounds are normal. There is no distension.     Palpations: Abdomen is soft. Abdomen is not rigid.     Tenderness: There is no abdominal tenderness. There is no right CVA tenderness, left CVA tenderness, guarding or rebound. Negative signs include Murphy's sign and McBurney's sign.  Musculoskeletal: Normal range of motion.     Left ankle: She exhibits swelling. She exhibits normal range of motion, no ecchymosis, no deformity and normal pulse. No tenderness. Achilles tendon normal.     Left foot: Normal range of motion. Tenderness, bony tenderness and swelling present. No crepitus or deformity.       Feet:     Comments: Left foot and ankle with mild swelling around the lateral malleolus and first MTP joint region, no erythema or warmth, no crepitus or deformity, no tenderness to the ankle but some mild tenderness to the first MTP joint, no overlying skin changes.  FROM intact in all joints.  Strength and sensation grossly intact, distal pulses intact, soft compartments. No leg swelling.   Skin:    General: Skin is warm and dry.     Findings: No rash.  Neurological:     Mental Status: She is alert and oriented to person, place, and time.     Sensory: Sensation is intact. No sensory deficit.     Motor: Motor function is intact.  Psychiatric:        Mood and Affect: Mood and affect normal.        Behavior: Behavior normal.      ED Treatments / Results  Labs (all labs ordered are listed, but only abnormal results are displayed) Labs Reviewed  BASIC METABOLIC PANEL - Abnormal; Notable for the following components:      Result Value   Potassium 3.4 (*)    Glucose, Bld 280 (*)    All other components within normal limits  CBC - Abnormal; Notable for the following components:   MCH 24.6 (*)    All other components within normal limits  I-STAT TROPONIN, ED  POCT I-STAT TROPONIN I    EKG None  Radiology  Dg  Chest 2 View  Result Date: 02/25/2019 CLINICAL DATA:  73 year old female with left foot swelling, no known injury. Clinical history of gout. EXAM: CHEST - 2 VIEW COMPARISON:  Prior chest x-ray 12/08/2018 FINDINGS: The lungs are clear and negative for focal airspace consolidation, pulmonary edema or suspicious pulmonary nodule. No pleural effusion or pneumothorax. Cardiac and mediastinal contours are within normal limits. No acute fracture or lytic or blastic osseous lesions. The visualized upper abdominal bowel gas pattern is unremarkable. IMPRESSION: No active cardiopulmonary disease. Electronically Signed   By: Jacqulynn Cadet M.D.   On: 02/25/2019 13:47    Procedures Procedures (including critical care time)  Medications Ordered in ED Medications  sodium chloride flush (NS) 0.9 % injection 3 mL (has no administration in time range)     Initial Impression / Assessment and Plan / ED Course  I have reviewed the triage vital signs and the nursing notes.  Pertinent labs & imaging results that were available during my care of the patient were reviewed by me and considered in my medical decision making (see chart for details).        73 y.o. female here due to left ankle and foot pain and swelling for 1 week intermittently.  Patient states that she is been told she has gout before, chart review reveals a visit from 12/17/2017 where her uric acid level is 10 and she was diagnosed with gout.  She has been treated twice from the ED for gout, both times using prednisone.  That is her only complaint at this time.  On exam, left foot and ankle mildly swollen mostly around the first MTP joint and the lateral malleolus, no tenderness to the ankle but some mild tenderness to the first MTP joint, no erythema or warmth, F ROM intact, neurovascularly intact with soft compartments.  No edema up the leg.  Blood pressure and heart rate elevated similar to prior visits, patient states that she always has a high  heart rate when she comes to the ER, heart rate on exam is in the 110s. She has no other complaints related to her abnormal vitals, doubt PE or other concerning etiology, she is well appearing. Work up done in triage includes: trop neg, BMP with gluc 280 K 3.4, CBC WNL, CXR neg. As for her foot, suspect gout flare, will treat with prednisone; as for her VS changes, likely from not being on her meds; will restart her home meds, but doubt need for emergent work up or management at this time. Will refill these meds, but advised that she must f/up with a PCP for further outpatient management. Advised she call her insurance company to find providers in the area, which she will do so today. DASH diet advised. F/up with PCP in 1-2wks for recheck and ongoing outpatient management. Discussed case with my attending Dr. Venora Maples who agrees with plan. I explained the diagnosis and have given explicit precautions to return to the ER including for any other new or worsening symptoms. The patient understands and accepts the medical plan as it's been dictated and I have answered their questions. Discharge instructions concerning home care and prescriptions have been given. The patient is STABLE and is discharged to home in good condition.    Final Clinical Impressions(s) / ED Diagnoses   Final diagnoses:  Left foot pain  Acute gout involving toe of left foot, unspecified cause  Essential hypertension  Medication refill    ED Discharge Orders  Ordered    predniSONE (DELTASONE) 20 MG tablet  Daily with breakfast     02/25/19 1448    verapamil (CALAN-SR) 240 MG CR tablet  2 times daily     02/25/19 1448    lisinopril (PRINIVIL,ZESTRIL) 20 MG tablet  Daily     02/25/19 1448    triamterene-hydrochlorothiazide (MAXZIDE-25) 37.5-25 MG tablet  Daily     02/25/19 1448    cloNIDine (CATAPRES) 0.2 MG tablet  Daily     02/25/19 1448    metFORMIN (GLUCOPHAGE) 1000 MG tablet  2 times daily     02/25/19 [id number], [place], Hershal Coria 02/25/19 1458    Jola Schmidt, MD 02/25/19 1646

## 2019-02-25 NOTE — ED Notes (Signed)
Pt continues to have elevated BP, provider made aware. Pt reports that she has not taking BP medications.

## 2019-02-25 NOTE — ED Notes (Addendum)
Pt is alert and oriented x 4 and is verbally responsive. Pt reports rt foot pain 8/10, burning. Pt has friend at bedside at this time.

## 2019-02-25 NOTE — Discharge Instructions (Signed)
Your left foot and ankle pain is likely from a gout flare.  Take prednisone as directed.  Alternate between Tylenol and ibuprofen as needed for pain.  Ice and elevate the ankle and foot to help with pain and swelling.  Your blood pressure and heart rate were elevated today, it is important that you take your home medications as directed. Eat a low salt-low sodium diet to help control your blood pressure.  You have been provided a refill of these medications as a courtesy today, but it is very important that you follow-up with a primary care doctor to have future medication refills and management of your health.  Call your insurance company today to get a list of providers in your area that take your insurance.  Follow-up with a primary care doctor in the next 1 to 2 weeks to establish medical care and for follow-up from today's visit.  Return to the ER for emergent changes or worsening in symptoms.

## 2019-08-31 ENCOUNTER — Emergency Department (HOSPITAL_BASED_OUTPATIENT_CLINIC_OR_DEPARTMENT_OTHER): Payer: Medicare HMO

## 2019-08-31 ENCOUNTER — Other Ambulatory Visit: Payer: Self-pay

## 2019-08-31 ENCOUNTER — Emergency Department (HOSPITAL_COMMUNITY): Payer: Medicare HMO

## 2019-08-31 ENCOUNTER — Emergency Department (HOSPITAL_COMMUNITY)
Admission: EM | Admit: 2019-08-31 | Discharge: 2019-08-31 | Disposition: A | Discharge status: Home / Self Care | Payer: Medicare HMO | Attending: Emergency Medicine | Admitting: Emergency Medicine

## 2019-08-31 DIAGNOSIS — R2242 Localized swelling, mass and lump, left lower limb: Secondary | ICD-10-CM | POA: Insufficient documentation

## 2019-08-31 DIAGNOSIS — E119 Type 2 diabetes mellitus without complications: Secondary | ICD-10-CM | POA: Insufficient documentation

## 2019-08-31 DIAGNOSIS — M7989 Other specified soft tissue disorders: Secondary | ICD-10-CM | POA: Diagnosis not present

## 2019-08-31 DIAGNOSIS — Z79899 Other long term (current) drug therapy: Secondary | ICD-10-CM | POA: Diagnosis not present

## 2019-08-31 DIAGNOSIS — I10 Essential (primary) hypertension: Secondary | ICD-10-CM | POA: Insufficient documentation

## 2019-08-31 DIAGNOSIS — Z7984 Long term (current) use of oral hypoglycemic drugs: Secondary | ICD-10-CM | POA: Insufficient documentation

## 2019-08-31 DIAGNOSIS — R0602 Shortness of breath: Secondary | ICD-10-CM | POA: Diagnosis not present

## 2019-08-31 LAB — CBC WITH DIFFERENTIAL/PLATELET
Abs Immature Granulocytes: 0.01 10*3/uL (ref 0.00–0.07)
Basophils Absolute: 0.1 10*3/uL (ref 0.0–0.1)
Basophils Relative: 1 %
Eosinophils Absolute: 0.1 10*3/uL (ref 0.0–0.5)
Eosinophils Relative: 1 %
HCT: 40.8 % (ref 36.0–46.0)
Hemoglobin: 12.6 g/dL (ref 12.0–15.0)
Immature Granulocytes: 0 %
Lymphocytes Relative: 38 %
Lymphs Abs: 2.4 10*3/uL (ref 0.7–4.0)
MCH: 24.2 pg — ABNORMAL LOW (ref 26.0–34.0)
MCHC: 30.9 g/dL (ref 30.0–36.0)
MCV: 78.3 fL — ABNORMAL LOW (ref 80.0–100.0)
Monocytes Absolute: 0.5 10*3/uL (ref 0.1–1.0)
Monocytes Relative: 7 %
Neutro Abs: 3.3 10*3/uL (ref 1.7–7.7)
Neutrophils Relative %: 53 %
Platelets: 343 10*3/uL (ref 150–400)
RBC: 5.21 MIL/uL — ABNORMAL HIGH (ref 3.87–5.11)
RDW: 14.5 % (ref 11.5–15.5)
WBC: 6.3 10*3/uL (ref 4.0–10.5)
nRBC: 0 % (ref 0.0–0.2)

## 2019-08-31 LAB — COMPREHENSIVE METABOLIC PANEL
ALT: 66 U/L — ABNORMAL HIGH (ref 0–44)
AST: 70 U/L — ABNORMAL HIGH (ref 15–41)
Albumin: 4.4 g/dL (ref 3.5–5.0)
Alkaline Phosphatase: 56 U/L (ref 38–126)
Anion gap: 16 — ABNORMAL HIGH (ref 5–15)
BUN: 10 mg/dL (ref 8–23)
CO2: 20 mmol/L — ABNORMAL LOW (ref 22–32)
Calcium: 9.8 mg/dL (ref 8.9–10.3)
Chloride: 100 mmol/L (ref 98–111)
Creatinine, Ser: 0.85 mg/dL (ref 0.44–1.00)
GFR calc Af Amer: >60 mL/min (ref >60)
GFR calc non Af Amer: >60 mL/min (ref >60)
Glucose, Bld: 251 mg/dL — ABNORMAL HIGH (ref 70–99)
Potassium: 3.5 mmol/L (ref 3.5–5.1)
Sodium: 136 mmol/L (ref 135–145)
Total Bilirubin: 0.7 mg/dL (ref 0.3–1.2)
Total Protein: 8.9 g/dL — ABNORMAL HIGH (ref 6.5–8.1)

## 2019-08-31 LAB — CK: Total CK: 244 U/L — ABNORMAL HIGH (ref 38–234)

## 2019-08-31 LAB — TROPONIN I (HIGH SENSITIVITY): Troponin I (High Sensitivity): 4 ng/L (ref <18)

## 2019-08-31 LAB — BRAIN NATRIURETIC PEPTIDE: B Natriuretic Peptide: 19.5 pg/mL (ref 0.0–100.0)

## 2019-08-31 MED ORDER — FUROSEMIDE 40 MG PO TABS
40.0000 mg | ORAL_TABLET | Freq: Once | ORAL | Status: AC
  Administered 2019-08-31: 40 mg via ORAL
  Filled 2019-08-31: qty 1

## 2019-08-31 MED ORDER — HYDROCHLOROTHIAZIDE 12.5 MG PO CAPS
25.0000 mg | ORAL_CAPSULE | Freq: Once | ORAL | Status: AC
  Administered 2019-08-31: 25 mg via ORAL
  Filled 2019-08-31: qty 2

## 2019-08-31 MED ORDER — FUROSEMIDE 20 MG PO TABS: 20.0000 mg | ORAL_TABLET | Freq: Every day | ORAL | 0 refills | Status: DC

## 2019-08-31 MED ORDER — CLONIDINE HCL 0.1 MG PO TABS
0.2000 mg | ORAL_TABLET | Freq: Once | ORAL | Status: AC
  Administered 2019-08-31: 0.2 mg via ORAL
  Filled 2019-08-31: qty 2

## 2019-08-31 NOTE — Discharge Instructions (Signed)
Take lasix 20 mg daily for 3 days   See your doctor.   Continue your current blood pressure medicines   Return to ER if you have worse dizziness, chest pain, shortness of breath, leg swelling

## 2019-08-31 NOTE — Progress Notes (Signed)
Left lower extremity venous duplex completed. Refer to "CV Proc" under chart review to view preliminary results.  08/31/2019 9:11 AM Maudry Mayhew, MHA, RVT, RDCS, RDMS

## 2019-08-31 NOTE — ED Triage Notes (Signed)
Pt arrives to ED via [facility] coming from home. Pt c/o "not feeling well" and her BP was elevated at 240/118. EMS also reports that pt c/o her left foot being swollen for the last week. EMS reports pt BP came down to 185/100 during transport. CBG was 270.

## 2019-08-31 NOTE — ED Provider Notes (Signed)
[facility] DEPT Provider Note   CSN: [id number] Arrival date & time: 08/31/19  1594     History   Chief Complaint Chief Complaint  Patient presents with  . Hypertension  . Fatigue    HPI Frances Cabrera is a 73 y.o. female history diabetes, hypertension, here presenting with shortness of breath, left leg swelling, hypertension.  Patient states that she has difficult to control hypertension in her doctor recently changed her medicines but she has not been taking all of them.  According to her doctor's note, she was supposed to be on clonidine, ARB, and HCTZ.  She states that her blood pressure has been fluctuating and she did miss some doses. She woke up this morning with some shortness of breath. She states that her left leg has been swollen for the last week or so.  Denies any chest pain or headaches or vomiting. Denies any weakness or trouble speaking.      The history is provided by the patient.    Past Medical History:  Diagnosis Date  . Diabetes mellitus   . Elevated LFTs   . Hypertension   . Menopause   . Microalbuminuria   . Obesity   . Recurrent UTI    post-coital    Patient Active Problem List   Diagnosis Date Noted  . Diabetes mellitus type 2 in obese ([place]) 05/30/2012  . HTN (hypertension) 05/30/2012  . Microalbuminuria   . Recurrent UTI   . BMI 35.0-35.9,adult   . Elevated LFTs     Past Surgical History:  Procedure Laterality Date  . CHOLECYSTECTOMY  2000  . TUBAL LIGATION       OB History   No obstetric history on file.      Home Medications    Prior to Admission medications   Medication Sig Start Date End Date Taking? Authorizing Provider  acetaminophen (TYLENOL) 500 MG tablet Take 750 mg by mouth every 6 (six) hours as needed for moderate pain or headache. Reported on 05/31/2016    [provider]  Blood Glucose Monitoring Suppl (ACCU-CHEK AVIVA PLUS) w/Device KIT USE AS DIRECTED 01/02/17    Harrison Mons, PA  Blood Pressure Monitoring (B-D ASSURE BPM/AUTO WRIST CUFF) MISC Use to measure home blood pressure. 09/05/15   Harrison Mons, PA  cloNIDine (CATAPRES) 0.2 MG tablet TAKE 1 TABLET EVERY DAY 02/12/18   Harrison Mons, PA  cloNIDine (CATAPRES) 0.2 MG tablet Take 1 tablet (0.2 mg total) by mouth daily. 02/25/19   Street, [place], PA-C  glucose blood test strip Use to test blood sugar once daily. E11.9 02/08/16   Harrison Mons, PA  hydrocortisone (PROCTO-PAK) 1 % CREA Apply as directed to symptomatic hemorrhoids Patient taking differently: Apply 1 application topically daily as needed (symptomatic hemorrhoids).  01/10/17   Harrison Mons, PA  Ketotifen Fumarate (ALAWAY OP) Place 1 drop into both eyes 2 (two) times daily.    [provider]  Lancet Device MISC Use for home glucose monitoring 02/08/16   Harrison Mons, PA  Lancets [facility] ULTRASOFT) lancets Use to test blood sugar once daily. E11.9 12/31/15   Harrison Mons, PA  Lancets Misc. (ACCU-CHEK SOFTCLIX LANCET DEV) KIT USE FOR HOME GLUCOSE MONITORING 01/02/17   Harrison Mons, PA  lisinopril (PRINIVIL,ZESTRIL) 20 MG tablet Take 1 tablet (20 mg total) by mouth daily. OFFICE VISIT NEEDED FOR REFILLS 12/08/18   Dorie Rank, MD  lisinopril (PRINIVIL,ZESTRIL) 20 MG tablet Take 1 tablet (20 mg total) by mouth daily. 02/25/19  Street, [place], PA-C  LORazepam (ATIVAN) 1 MG tablet Take 1 tablet (1 mg total) by mouth every 6 (six) hours as needed for anxiety. Patient not taking: Reported on 12/08/2018 04/09/17   Harrison Mons, PA  metFORMIN (GLUCOPHAGE) 1000 MG tablet Take 1 tablet (1,000 mg total) by mouth 2 (two) times daily. 12/08/18   Dorie Rank, MD  metFORMIN (GLUCOPHAGE) 1000 MG tablet Take 1 tablet (1,000 mg total) by mouth 2 (two) times daily. 02/25/19   Street, [place], PA-C  Multiple Vitamins-Minerals (MULTIVITAMIN WITH MINERALS) tablet Take 1 tablet by mouth daily. Centrum Silver    [provider]   oxyCODONE-acetaminophen (PERCOCET) 5-325 MG tablet Take 1 tablet by mouth every 4 (four) hours as needed for moderate pain. Patient not taking: Reported on 46/28/6381 06/20/10   Delora Fuel, MD  pantoprazole (PROTONIX) 20 MG tablet TAKE 1 TABLET TWICE DAILY BEFORE A MEAL Patient not taking: Reported on 12/08/2018 03/22/18   Harrison Mons, PA  predniSONE (DELTASONE) 20 MG tablet Take 2 tablets (40 mg total) by mouth daily. 12/08/18   Dorie Rank, MD  triamterene-hydrochlorothiazide (DYAZIDE) 37.5-25 MG capsule TAKE 1 CAPSULE EVERY DAY 02/12/18   Harrison Mons, PA  triamterene-hydrochlorothiazide (MAXZIDE-25) 37.5-25 MG tablet Take 1 tablet by mouth daily. 02/25/19   Street, [place], PA-C  verapamil (CALAN-SR) 240 MG CR tablet TAKE 1 TABLET TWICE DAILY 02/12/18   Harrison Mons, PA  verapamil (CALAN-SR) 240 MG CR tablet Take 1 tablet (240 mg total) by mouth 2 (two) times daily. 02/25/19   Street, [place], PA-C    Family History Family History  Problem Relation Age of Onset  . Diabetes Mother   . Hypertension Mother   . Kidney failure Mother   . Diabetes Father   . Diabetes Brother   . Diabetes Brother     Social History Social History   Tobacco Use  . Smoking status: Never Smoker  . Smokeless tobacco: Never Used  Substance Use Topics  . Alcohol use: No    Alcohol/week: 0.0 standard drinks  . Drug use: No     Allergies   Penicillins and Shrimp [shellfish allergy]   Review of Systems Review of Systems  Respiratory: Positive for shortness of breath.   Cardiovascular: Positive for leg swelling.  All other systems reviewed and are negative.    Physical Exam Updated Vital Signs BP (!) 168/84   Pulse (!) 108   Temp 98.8 F (37.1 C) (Oral)   Resp 19   SpO2 99%   Physical Exam Vitals signs and nursing note reviewed.  Constitutional:      Appearance: Normal appearance.  HENT:     Head: Normocephalic.     Nose: Nose normal.     Mouth/Throat:     Mouth: Mucous  membranes are moist.  Eyes:     Extraocular Movements: Extraocular movements intact.     Pupils: Pupils are equal, round, and reactive to light.  Neck:     Musculoskeletal: Normal range of motion.  Cardiovascular:     Rate and Rhythm: Normal rate and regular rhythm.     Pulses: Normal pulses.     Heart sounds: Normal heart sounds.  Pulmonary:     Effort: Pulmonary effort is normal.     Breath sounds: Normal breath sounds.  Abdominal:     General: Abdomen is flat.  Musculoskeletal:     Comments: 2+ edema L leg, mild L calf tenderness. Trace edema R leg   Skin:    General: Skin is warm.  Capillary Refill: Capillary refill takes less than 2 seconds.  Neurological:     General: No focal deficit present.     Mental Status: She is alert and oriented to person, place, and time.  Psychiatric:        Mood and Affect: Mood normal.        Behavior: Behavior normal.      ED Treatments / Results  Labs (all labs ordered are listed, but only abnormal results are displayed) Labs Reviewed  CBC WITH DIFFERENTIAL/PLATELET - Abnormal; Notable for the following components:      Result Value   RBC 5.21 (*)    MCV 78.3 (*)    MCH 24.2 (*)    All other components within normal limits  COMPREHENSIVE METABOLIC PANEL - Abnormal; Notable for the following components:   CO2 20 (*)    Glucose, Bld 251 (*)    Total Protein 8.9 (*)    AST 70 (*)    ALT 66 (*)    Anion gap 16 (*)    All other components within normal limits  CK - Abnormal; Notable for the following components:   Total CK 244 (*)    All other components within normal limits  BRAIN NATRIURETIC PEPTIDE  URINALYSIS, ROUTINE W REFLEX MICROSCOPIC  TROPONIN I (HIGH SENSITIVITY)  TROPONIN I (HIGH SENSITIVITY)    EKG EKG Interpretation  Date/Time:  Wednesday August 31 2019 08:41:06 EDT Ventricular Rate:  106 PR Interval:    QRS Duration: 109 QT Interval:  358 QTC Calculation: 476 R Axis:   63 Text Interpretation:   Sinus tachycardia Borderline T abnormalities, inferior leads Artifact in lead(s) II V1 No significant change since last tracing Confirmed by Wandra Arthurs ([id number]) on 08/31/2019 9:30:38 AM   Radiology Dg Chest Port 1 View  Result Date: 08/31/2019 CLINICAL DATA:  Shortness of breath EXAM: PORTABLE CHEST 1 VIEW COMPARISON:  02/25/2019 FINDINGS: Normal heart size and mediastinal contours. Mildly low lung volumes with interstitial coarsening that is stable. There is no edema, consolidation, effusion, or pneumothorax. IMPRESSION: Stable exam.  No evidence of acute disease. Electronically Signed   By: Monte Fantasia M.D.   On: 08/31/2019 08:41   Vas [place] Lower Extremity Venous (dvt) (only [facility])  Result Date: 08/31/2019  Lower Venous Study Indications: Swelling.  Limitations: Body habitus and poor ultrasound/tissue interface. Comparison Study: No prior study Performing Technologist: Maudry Mayhew MHA, RDMS, RVT, RDCS  Examination Guidelines: A complete evaluation includes B-mode imaging, spectral Doppler, color Doppler, and power Doppler as needed of all accessible portions of each vessel. Bilateral testing is considered an integral part of a complete examination. Limited examinations for reoccurring indications may be performed as noted.  Right common femoral vein not evaluated due to patient body habitus.  +---------+---------------+---------+-----------+----------+--------------+ LEFT     CompressibilityPhasicitySpontaneityPropertiesThrombus Aging +---------+---------------+---------+-----------+----------+--------------+ CFV      Full                                                        +---------+---------------+---------+-----------+----------+--------------+ FV Prox  Full                                                        +---------+---------------+---------+-----------+----------+--------------+  FV Mid   Full                                                         +---------+---------------+---------+-----------+----------+--------------+ FV DistalFull                                                        +---------+---------------+---------+-----------+----------+--------------+ POP      Full                                                        +---------+---------------+---------+-----------+----------+--------------+ Unable to visualize the left saphenofemoral junction and profunda femoral vein. Limited visualization of the left posterior tibial and peroneal veins.  Summary: Left: There is no evidence of deep vein thrombosis in the lower extremity. However, portions of this examination were limited- see technologist comments above. No cystic structure found in the popliteal fossa.  *See table(s) above for measurements and observations.    Preliminary     Procedures Procedures (including critical care time)  Medications Ordered in ED Medications  cloNIDine (CATAPRES) tablet 0.2 mg (0.2 mg Oral Given 08/31/19 0834)  hydrochlorothiazide (MICROZIDE) capsule 25 mg (25 mg Oral Given 08/31/19 0834)     Initial Impression / Assessment and Plan / ED Course  I have reviewed the triage vital signs and the nursing notes.  Pertinent labs & imaging results that were available during my care of the patient were reviewed by me and considered in my medical decision making (see chart for details).       Nolene Rocks is a 73 y.o. female here with SOB, L leg swelling, hypertension. Likely symptomatic hypertension. She is not compliant with her BP meds. Will give home BP meds. Will check labs, EKG, L leg DVT study. No signs of PE or dissection   10:30 AM Labs unremarkable. CK 300. US showed no DVT. BP down to 160s from 200. She is on HCTZ already. Will give several days of lasix to help with swelling but told her to continue BP meds and call PCP if she needs medication adjustment   Final Clinical Impressions(s) / ED Diagnoses   Final  diagnoses:  None    ED Discharge Orders    None       Drenda Freeze, MD 08/31/19 1031

## 2019-12-08 ENCOUNTER — Emergency Department (HOSPITAL_COMMUNITY)
Admission: EM | Admit: 2019-12-08 | Discharge: 2019-12-08 | Disposition: A | Discharge status: Home / Self Care | Payer: Medicare HMO | Attending: Emergency Medicine | Admitting: Emergency Medicine

## 2019-12-08 ENCOUNTER — Encounter (HOSPITAL_COMMUNITY): Payer: Self-pay | Admitting: Emergency Medicine

## 2019-12-08 ENCOUNTER — Emergency Department (HOSPITAL_COMMUNITY): Payer: Medicare HMO

## 2019-12-08 DIAGNOSIS — E119 Type 2 diabetes mellitus without complications: Secondary | ICD-10-CM | POA: Insufficient documentation

## 2019-12-08 DIAGNOSIS — Z79899 Other long term (current) drug therapy: Secondary | ICD-10-CM | POA: Diagnosis not present

## 2019-12-08 DIAGNOSIS — Z7984 Long term (current) use of oral hypoglycemic drugs: Secondary | ICD-10-CM | POA: Diagnosis not present

## 2019-12-08 DIAGNOSIS — I10 Essential (primary) hypertension: Secondary | ICD-10-CM | POA: Diagnosis not present

## 2019-12-08 DIAGNOSIS — K5790 Diverticulosis of intestine, part unspecified, without perforation or abscess without bleeding: Secondary | ICD-10-CM | POA: Insufficient documentation

## 2019-12-08 DIAGNOSIS — K921 Melena: Secondary | ICD-10-CM | POA: Diagnosis present

## 2019-12-08 LAB — CBC
HCT: 38.1 % (ref 36.0–46.0)
Hemoglobin: 12.2 g/dL (ref 12.0–15.0)
MCH: 24.2 pg — ABNORMAL LOW (ref 26.0–34.0)
MCHC: 32 g/dL (ref 30.0–36.0)
MCV: 75.4 fL — ABNORMAL LOW (ref 80.0–100.0)
Platelets: 322 10*3/uL (ref 150–400)
RBC: 5.05 MIL/uL (ref 3.87–5.11)
RDW: 15.2 % (ref 11.5–15.5)
WBC: 5.6 10*3/uL (ref 4.0–10.5)
nRBC: 0 % (ref 0.0–0.2)

## 2019-12-08 LAB — URINALYSIS, ROUTINE W REFLEX MICROSCOPIC
Bilirubin Urine: NEGATIVE
Glucose, UA: NEGATIVE mg/dL
Hgb urine dipstick: NEGATIVE
Ketones, ur: 5 mg/dL — AB
Leukocytes,Ua: NEGATIVE
Nitrite: NEGATIVE
Protein, ur: 30 mg/dL — AB
Specific Gravity, Urine: 1.031 — ABNORMAL HIGH (ref 1.005–1.030)
pH: 5 (ref 5.0–8.0)

## 2019-12-08 LAB — COMPREHENSIVE METABOLIC PANEL
ALT: 65 U/L — ABNORMAL HIGH (ref 0–44)
AST: 76 U/L — ABNORMAL HIGH (ref 15–41)
Albumin: 4.2 g/dL (ref 3.5–5.0)
Alkaline Phosphatase: 43 U/L (ref 38–126)
Anion gap: 19 — ABNORMAL HIGH (ref 5–15)
BUN: 13 mg/dL (ref 8–23)
CO2: 20 mmol/L — ABNORMAL LOW (ref 22–32)
Calcium: 9.5 mg/dL (ref 8.9–10.3)
Chloride: 98 mmol/L (ref 98–111)
Creatinine, Ser: 1.29 mg/dL — ABNORMAL HIGH (ref 0.44–1.00)
GFR calc Af Amer: 48 mL/min — ABNORMAL LOW (ref >60)
GFR calc non Af Amer: 42 mL/min — ABNORMAL LOW (ref >60)
Glucose, Bld: 159 mg/dL — ABNORMAL HIGH (ref 70–99)
Potassium: 3.4 mmol/L — ABNORMAL LOW (ref 3.5–5.1)
Sodium: 137 mmol/L (ref 135–145)
Total Bilirubin: 1.1 mg/dL (ref 0.3–1.2)
Total Protein: 8.5 g/dL — ABNORMAL HIGH (ref 6.5–8.1)

## 2019-12-08 LAB — POC OCCULT BLOOD, ED: Fecal Occult Bld: POSITIVE — AB

## 2019-12-08 LAB — LIPASE, BLOOD: Lipase: 21 U/L (ref 11–51)

## 2019-12-08 MED ORDER — SODIUM CHLORIDE 0.9 % IV BOLUS
500.0000 mL | Freq: Once | INTRAVENOUS | Status: AC
  Administered 2019-12-08: 500 mL via INTRAVENOUS

## 2019-12-08 MED ORDER — IOHEXOL 300 MG/ML  SOLN
100.0000 mL | Freq: Once | INTRAMUSCULAR | Status: AC | PRN
  Administered 2019-12-08: 100 mL via INTRAVENOUS

## 2019-12-08 MED ORDER — POTASSIUM CHLORIDE CRYS ER 20 MEQ PO TBCR
40.0000 meq | EXTENDED_RELEASE_TABLET | Freq: Once | ORAL | Status: AC
  Administered 2019-12-08: 40 meq via ORAL
  Filled 2019-12-08: qty 2

## 2019-12-08 MED ORDER — MORPHINE SULFATE (PF) 4 MG/ML IV SOLN: 4.0000 mg | Freq: Once | INTRAVENOUS | Status: DC

## 2019-12-08 MED ORDER — ONDANSETRON HCL 4 MG/2ML IJ SOLN
4.0000 mg | Freq: Once | INTRAMUSCULAR | Status: AC
  Administered 2019-12-08: 4 mg via INTRAVENOUS
  Filled 2019-12-08: qty 2

## 2019-12-08 MED ORDER — SODIUM CHLORIDE 0.9% FLUSH: 3.0000 mL | Freq: Once | INTRAVENOUS | Status: DC

## 2019-12-08 NOTE — ED Provider Notes (Signed)
Physical Exam  BP (!) 167/76   Pulse (!) 104   Temp 99.1 F (37.3 C) (Oral)   Resp 14   SpO2 98%   Physical Exam Vitals and nursing note reviewed.  Constitutional:      General: She is not in acute distress.    Appearance: She is well-developed. She is not diaphoretic.  HENT:     Head: Normocephalic and atraumatic.  Eyes:     General: No scleral icterus.    Conjunctiva/sclera: Conjunctivae normal.  Pulmonary:     Effort: Pulmonary effort is normal. No respiratory distress.  Musculoskeletal:     Cervical back: Normal range of motion.  Skin:    Findings: No rash.  Neurological:     Mental Status: She is alert.     ED Course/Procedures     Procedures  MDM   Care of patient assumed from PA [place] at 3:44 PM.  Agree with history, physical exam and plan.  See their note for further details.  Briefly, 73 y.o. female with PMH/PSH as below who presents with 3-day history of diarrhea and bloody stools.  She reports associated abdominal cramping with the diarrhea.  At PCPs office today she was found to be tachycardic and sent to the ED for abnormal EKG which showed possible ischemia.  Denies any anticoagulation.  Work-up so far including CBC shows normal hemoglobin, heme positive stool, normal CMP.  CT shows single diverticulum without evidence of diverticulitis.  EKG here without signs of ischemia.  Past Medical History:  Diagnosis Date  . Diabetes mellitus   . Elevated LFTs   . Hypertension   . Menopause   . Microalbuminuria   . Obesity   . Recurrent UTI    post-coital   Past Surgical History:  Procedure Laterality Date  . CHOLECYSTECTOMY  2000  . TUBAL LIGATION        Current Plan: Plan is to give 500 cc bolus and recheck heart rate and see if tachycardia has improved.  Patient will be discharged home if symptoms improve.    MDM/ED Course: Patient completed bolus and given pain location with improvement.  Heart rate has improved as well.  She will be discharged  to home with PCP follow-up.   Consults: None    Significant labs/images:  Labs Reviewed  COMPREHENSIVE METABOLIC PANEL - Abnormal; Notable for the following components:      Result Value   Potassium 3.4 (*)    CO2 20 (*)    Glucose, Bld 159 (*)    Creatinine, Ser 1.29 (*)    Total Protein 8.5 (*)    AST 76 (*)    ALT 65 (*)    GFR calc non Af Amer 42 (*)    GFR calc Af Amer 48 (*)    Anion gap 19 (*)    All other components within normal limits  CBC - Abnormal; Notable for the following components:   MCV 75.4 (*)    MCH 24.2 (*)    All other components within normal limits  URINALYSIS, ROUTINE W REFLEX MICROSCOPIC - Abnormal; Notable for the following components:   APPearance CLOUDY (*)    Specific Gravity, Urine 1.031 (*)    Ketones, ur 5 (*)    Protein, ur 30 (*)    Bacteria, UA RARE (*)    All other components within normal limits  POC OCCULT BLOOD, ED - Abnormal; Notable for the following components:   Fecal Occult Bld POSITIVE (*)    All  other components within normal limits  LIPASE, BLOOD  POC URINE PREG, ED     I personally reviewed and interpreted all labs.   Patient is hemodynamically stable, in NAD, and able to ambulate in the ED. Evaluation does not show pathology that would require ongoing emergent intervention or inpatient treatment. I explained the diagnosis to the patient. Pain has been managed and has no complaints prior to discharge. Patient is comfortable with above plan and is stable for discharge at this time. All questions were answered prior to disposition. Strict return precautions for returning to the ED were discussed. Encouraged follow up with PCP.   An After Visit Summary was printed and given to the patient.   Portions of this note were generated with [employer]. Dictation errors may occur despite best attempts at proofreading.     Delia Heady, PA-C 12/08/19 1631    Margette Fast, MD 12/09/19 [phone]

## 2019-12-08 NOTE — ED Notes (Signed)
Patient transported to CT 

## 2019-12-08 NOTE — ED Provider Notes (Signed)
[facility] EMERGENCY DEPARTMENT Provider Note   CSN: [id number] Arrival date & time: 12/08/19  1012     History Chief Complaint  Patient presents with  . Abdominal Pain  . Diarrhea    Frances Cabrera is a 73 y.o. female possible history of diabetes, hypertension, obesity who presents for evaluation from her doctor's office for 3 days of diarrhea, blood in stools.  Patient states that over the last 3 days, she has had diarrhea after eating anything.  She states that she has noticed some bright red blood in stools.  No melena.  She states that when she eats, she feels fine until about 20 or 30 minutes later when she starts having some abdominal cramping and then has episodes of diarrhea.  She has not had any nausea or vomiting.  She went to her primary care doctor today for evaluation.  At her primary care doctor, she was found to be tachycardic and was sent to the emergency department for further evaluation.  Patient reports that over the last 3 days, she has not taken her medications.  She reports she has some swelling of the left lower ankle which she has had previously.  She denies any chest pain, difficulty breathing, urinary complaints.  She is not currently on blood thinners.  The history is provided by the patient.       Past Medical History:  Diagnosis Date  . Diabetes mellitus   . Elevated LFTs   . Hypertension   . Menopause   . Microalbuminuria   . Obesity   . Recurrent UTI    post-coital    Patient Active Problem List   Diagnosis Date Noted  . Diabetes mellitus type 2 in obese ([place]) 05/30/2012  . HTN (hypertension) 05/30/2012  . Microalbuminuria   . Recurrent UTI   . BMI 35.0-35.9,adult   . Elevated LFTs     Past Surgical History:  Procedure Laterality Date  . CHOLECYSTECTOMY  2000  . TUBAL LIGATION       OB History   No obstetric history on file.     Family History  Problem Relation Age of Onset  . Diabetes Mother   .  Hypertension Mother   . Kidney failure Mother   . Diabetes Father   . Diabetes Brother   . Diabetes Brother     Social History   Tobacco Use  . Smoking status: Never Smoker  . Smokeless tobacco: Never Used  Substance Use Topics  . Alcohol use: No    Alcohol/week: 0.0 standard drinks  . Drug use: No    Home Medications Prior to Admission medications   Medication Sig Start Date End Date Taking? Authorizing Provider  acetaminophen (TYLENOL) 500 MG tablet Take 750 mg by mouth every 6 (six) hours as needed for moderate pain or headache. Reported on 05/31/2016    [provider]  Blood Glucose Monitoring Suppl (ACCU-CHEK AVIVA PLUS) w/Device KIT USE AS DIRECTED 01/02/17   Harrison Mons, PA  Blood Pressure Monitoring (B-D ASSURE BPM/AUTO WRIST CUFF) MISC Use to measure home blood pressure. 09/05/15   Harrison Mons, PA  cloNIDine (CATAPRES) 0.2 MG tablet TAKE 1 TABLET EVERY DAY 02/12/18   Harrison Mons, PA  cloNIDine (CATAPRES) 0.2 MG tablet Take 1 tablet (0.2 mg total) by mouth daily. 02/25/19   Street, [place], PA-C  furosemide (LASIX) 20 MG tablet Take 1 tablet (20 mg total) by mouth daily. 08/31/19   Drenda Freeze, MD  glucose blood test strip  Use to test blood sugar once daily. E11.9 02/08/16   Harrison Mons, PA  hydrocortisone (PROCTO-PAK) 1 % CREA Apply as directed to symptomatic hemorrhoids Patient taking differently: Apply 1 application topically daily as needed (symptomatic hemorrhoids).  01/10/17   Harrison Mons, PA  Ketotifen Fumarate (ALAWAY OP) Place 1 drop into both eyes 2 (two) times daily.    [provider]  Lancet Device MISC Use for home glucose monitoring 02/08/16   Harrison Mons, PA  Lancets [facility] ULTRASOFT) lancets Use to test blood sugar once daily. E11.9 12/31/15   Harrison Mons, PA  Lancets Misc. (ACCU-CHEK SOFTCLIX LANCET DEV) KIT USE FOR HOME GLUCOSE MONITORING 01/02/17   Harrison Mons, PA  lisinopril (PRINIVIL,ZESTRIL) 20 MG  tablet Take 1 tablet (20 mg total) by mouth daily. OFFICE VISIT NEEDED FOR REFILLS 12/08/18   Dorie Rank, MD  lisinopril (PRINIVIL,ZESTRIL) 20 MG tablet Take 1 tablet (20 mg total) by mouth daily. 02/25/19   Street, [place], PA-C  LORazepam (ATIVAN) 1 MG tablet Take 1 tablet (1 mg total) by mouth every 6 (six) hours as needed for anxiety. Patient not taking: Reported on 12/08/2018 04/09/17   Harrison Mons, PA  metFORMIN (GLUCOPHAGE) 1000 MG tablet Take 1 tablet (1,000 mg total) by mouth 2 (two) times daily. 12/08/18   Dorie Rank, MD  metFORMIN (GLUCOPHAGE) 1000 MG tablet Take 1 tablet (1,000 mg total) by mouth 2 (two) times daily. 02/25/19   Street, [place], PA-C  Multiple Vitamins-Minerals (MULTIVITAMIN WITH MINERALS) tablet Take 1 tablet by mouth daily. Centrum Silver    [provider]  oxyCODONE-acetaminophen (PERCOCET) 5-325 MG tablet Take 1 tablet by mouth every 4 (four) hours as needed for moderate pain. Patient not taking: Reported on 86/57/8469 05/16/94   Delora Fuel, MD  pantoprazole (PROTONIX) 20 MG tablet TAKE 1 TABLET TWICE DAILY BEFORE A MEAL Patient not taking: Reported on 12/08/2018 03/22/18   Harrison Mons, PA  predniSONE (DELTASONE) 20 MG tablet Take 2 tablets (40 mg total) by mouth daily. 12/08/18   Dorie Rank, MD  triamterene-hydrochlorothiazide (DYAZIDE) 37.5-25 MG capsule TAKE 1 CAPSULE EVERY DAY 02/12/18   Harrison Mons, PA  triamterene-hydrochlorothiazide (MAXZIDE-25) 37.5-25 MG tablet Take 1 tablet by mouth daily. 02/25/19   Street, [place], PA-C  verapamil (CALAN-SR) 240 MG CR tablet TAKE 1 TABLET TWICE DAILY 02/12/18   Harrison Mons, PA  verapamil (CALAN-SR) 240 MG CR tablet Take 1 tablet (240 mg total) by mouth 2 (two) times daily. 02/25/19   Street, [place], PA-C    Allergies    Penicillins and Shrimp [shellfish allergy]  Review of Systems   Review of Systems  Constitutional: Negative for fever.  Respiratory: Negative for cough and shortness of breath.     Cardiovascular: Negative for chest pain.  Gastrointestinal: Positive for abdominal pain, blood in stool and diarrhea. Negative for nausea and vomiting.  Genitourinary: Negative for dysuria and hematuria.  Neurological: Negative for headaches.  All other systems reviewed and are negative.   Physical Exam Updated Vital Signs BP (!) 157/81 (BP Location: Right Arm)   Pulse (!) 110   Temp 99.1 F (37.3 C) (Oral)   Resp 16   SpO2 97%   Physical Exam Vitals and nursing note reviewed. Exam conducted with a chaperone present.  Constitutional:      Appearance: Normal appearance. She is well-developed.  HENT:     Head: Normocephalic and atraumatic.  Eyes:     General: Lids are normal.     Conjunctiva/sclera: Conjunctivae normal.  Pupils: Pupils are equal, round, and reactive to light.  Cardiovascular:     Rate and Rhythm: Normal rate and regular rhythm.     Pulses: Normal pulses.     Heart sounds: Normal heart sounds. No murmur. No friction rub. No gallop.   Pulmonary:     Effort: Pulmonary effort is normal.     Breath sounds: Normal breath sounds.  Abdominal:     Palpations: Abdomen is soft. Abdomen is not rigid.     Tenderness: There is abdominal tenderness in the left lower quadrant. There is no guarding.     Comments: Abdomen soft, nondistended.  Tenderness palpation of the left lower quadrant.  Genitourinary:    Rectum: Guaiac result positive.     Comments: The exam was performed with a chaperone present.  No melena.  Small amount of bright red blood noted on DRE.  No palpable mass or tenderness. Musculoskeletal:        General: Normal range of motion.     Cervical back: Full passive range of motion without pain.  Skin:    General: Skin is warm and dry.     Capillary Refill: Capillary refill takes less than 2 seconds.  Neurological:     Mental Status: She is alert and oriented to person, place, and time.  Psychiatric:        Speech: Speech normal.     ED Results /  Procedures / Treatments   Labs (all labs ordered are listed, but only abnormal results are displayed) Labs Reviewed  COMPREHENSIVE METABOLIC PANEL - Abnormal; Notable for the following components:      Result Value   Potassium 3.4 (*)    CO2 20 (*)    Glucose, Bld 159 (*)    Creatinine, Ser 1.29 (*)    Total Protein 8.5 (*)    AST 76 (*)    ALT 65 (*)    GFR calc non Af Amer 42 (*)    GFR calc Af Amer 48 (*)    Anion gap 19 (*)    All other components within normal limits  CBC - Abnormal; Notable for the following components:   MCV 75.4 (*)    MCH 24.2 (*)    All other components within normal limits  URINALYSIS, ROUTINE W REFLEX MICROSCOPIC - Abnormal; Notable for the following components:   APPearance CLOUDY (*)    Specific Gravity, Urine 1.031 (*)    Ketones, ur 5 (*)    Protein, ur 30 (*)    Bacteria, UA RARE (*)    All other components within normal limits  POC OCCULT BLOOD, ED - Abnormal; Notable for the following components:   Fecal Occult Bld POSITIVE (*)    All other components within normal limits  LIPASE, BLOOD  POC URINE PREG, ED    EKG EKG Interpretation  Date/Time:  Thursday December 08 2019 12:26:53 EST Ventricular Rate:  123 PR Interval:    QRS Duration: 113 QT Interval:  431 QTC Calculation: 617 R Axis:   80 Text Interpretation: Junctional tachycardia Borderline intraventricular conduction delay Abnormal inferior Q waves Prolonged QT interval Confirmed by Lacretia Leigh ([id number]) on 12/08/2019 1:11:04 PM   Radiology CT ABDOMEN PELVIS W CONTRAST  Result Date: 12/08/2019 CLINICAL DATA:  Abdominal pain and diarrhea for the past 3 days. Clinical concern for diverticulitis. EXAM: CT ABDOMEN AND PELVIS WITH CONTRAST TECHNIQUE: Multidetector CT imaging of the abdomen and pelvis was performed using the standard protocol following bolus administration of intravenous contrast.  CONTRAST:  152m OMNIPAQUE IOHEXOL 300 MG/ML  SOLN COMPARISON:  Abdomen ultrasound  report dated 06/23/2001. FINDINGS: Lower chest: Minimal bibasilar atelectasis. Borderline enlarged heart. Hepatobiliary: Diffuse low density of the liver relative to the spleen. Cholecystectomy clips. Pancreas: Unremarkable. No pancreatic ductal dilatation or surrounding inflammatory changes. Spleen: Normal in size without focal abnormality. Adrenals/Urinary Tract: Normal appearing adrenal glands. Simple appearing upper pole left renal cyst. Normal appearing right kidney, ureters and urinary bladder. Stomach/Bowel: Single proximal sigmoid colon diverticulum without evidence of diverticulitis. Small to moderate-sized hiatal hernia. Normal appearing small bowel and appendix. Vascular/Lymphatic: Atheromatous arterial calcifications without aneurysm. No enlarged lymph nodes. Reproductive: Uterus and bilateral adnexa are unremarkable. Bilateral tubal ligation clips. Other: No abdominal wall hernia or abnormality. No abdominopelvic ascites. Musculoskeletal: Minimal lumbar and mild lower thoracic spine degenerative changes. IMPRESSION: 1. No acute abnormality. 2. Single sigmoid colon diverticulum without evidence of diverticulitis. 3. Small to moderate-sized hiatal hernia. 4. Diffuse hepatic steatosis. Electronically Signed   By: SClaudie ReveringM.D.   On: 12/08/2019 14:22    Procedures Procedures (including critical care time)  Medications Ordered in ED Medications  sodium chloride 0.9 % bolus 500 mL (0 mLs Intravenous Stopped 12/08/19 1428)  ondansetron (ZOFRAN) injection 4 mg (4 mg Intravenous Given 12/08/19 1251)  iohexol (OMNIPAQUE) 300 MG/ML solution 100 mL (100 mLs Intravenous Contrast Given 12/08/19 1414)  sodium chloride 0.9 % bolus 500 mL (0 mLs Intravenous Stopped 12/08/19 1538)  potassium chloride SA (KLOR-CON) CR tablet 40 mEq (40 mEq Oral Given 12/08/19 1434)    ED Course  I have reviewed the triage vital signs and the nursing notes.  Pertinent labs & imaging results that were available during  my care of the patient were reviewed by me and considered in my medical decision making (see chart for details).    MDM Rules/Calculators/A&P                      73year old female who presents for evaluation of 3 days of abdominal pain, diarrhea.  Also has noticed some bright red blood in her stools.  Went to PCP and was sent to the emergency department for further evaluation.  No chest pain, fevers, difficulty breathing.  Initial urine, she is tachycardic, afebrile.  She is slightly hypertensive.  Vitals otherwise stable.  On exam, she does have some tenderness in left lower quadrant.  Rectal exam did reveal small amount of bright red blood.  Concern for diverticulitis versus infectious process.  Also consider GI bleed.  Plan to check labs, CT abd/pelvis.  Patient is not having any chest pain or difficulty breathing.  I suspect that the tachycardia is likely due to dehydration.  No indication for troponins is do not suspect that this is ACS etiology.  Fecal occult is positive.  CMP shows potassium 3.4.  BUN is 13 and creatinine is 1.29.  This is slightly higher than baseline. CBC shows no leukocytosis.  Hemoglobin is stable at 12.2.  Lipase is unremarkable.  UA shows no evidence of infectious etiology.  Small amount of ketones.  Likely from dehydration.  CT on pelvis shows no acute abnormality.  Single sigmoid colon diverticulum without evidence of diverticulitis.  [facility] Reevaluation after fluids.  Portions of this note were generated with DLobbyist Dictation errors may occur despite best attempts at proofreading.  Final Clinical Impression(s) / ED Diagnoses Final diagnoses:  Diverticulosis    Rx / DC Orders ED Discharge  Orders    None       Desma Mcgregor 12/09/19 1335    Lacretia Leigh, MD 12/09/19 1515

## 2019-12-08 NOTE — ED Triage Notes (Signed)
EMS stated, we picked her up form Dr's office. She has been tackycardia , given 300 cc of N/S , 20g  Left hand. C/o swelling in ankles and has not taking her medications She has had diarrhea for last 3 days with abd. pin

## 2019-12-08 NOTE — Discharge Instructions (Signed)
Continue your home medications as previously prescribed. Return to the ED if you start to have worsening symptoms, worsening bleeding, lightheadedness, abdominal pain, chest pain.

## 2019-12-08 NOTE — ED Notes (Signed)
Patient verbalizes understanding of discharge instructions. Opportunity for questioning and answers were provided. Pt discharged from ED. 

## 2019-12-08 NOTE — ED Provider Notes (Signed)
Medical screening examination/treatment/procedure(s) were conducted as a shared visit with non-physician practitioner(s) and myself.  I personally evaluated the patient during the encounter.  EKG Interpretation  Date/Time:  Thursday December 08 2019 12:26:53 EST Ventricular Rate:  123 PR Interval:    QRS Duration: 113 QT Interval:  431 QTC Calculation: 617 R Axis:   80 Text Interpretation: Junctional tachycardia Borderline intraventricular conduction delay Abnormal inferior Q waves Prolonged QT interval Confirmed by Lacretia Leigh ([id number]) on 12/08/2019 1:11:04 PM  Medical screening examination/treatment/procedure(s) were conducted as a shared visit with non-physician practitioner(s) and myself.  I personally evaluated the patient during the encounter.  EKG Interpretation  Date/Time:  Thursday December 08 2019 12:26:53 EST Ventricular Rate:  123 PR Interval:    QRS Duration: 113 QT Interval:  431 QTC Calculation: 617 R Axis:   80 Text Interpretation: Junctional tachycardia Borderline intraventricular conduction delay Abnormal inferior Q waves Prolonged QT interval Confirmed by Lacretia Leigh ([id number]) on 12/08/2019 1:11:46 PM     73 year old female presents with diarrhea and sudden onset of left lower quadrant pain.  Her abdominal exam is benign here.  Abdominal CT negative for acute process.  Tachycardia improved with IV fluids.  Suspect mild level of dehydration.  EKG reviewed and no signs of ischemic changes.  She has had no chest pain or shortness of breath.  We will continue to IV hydrate and reassess   Lacretia Leigh, MD 12/08/19 1454

## 2020-02-03 ENCOUNTER — Other Ambulatory Visit: Payer: Self-pay | Admitting: Family Medicine

## 2020-02-03 DIAGNOSIS — R0989 Other specified symptoms and signs involving the circulatory and respiratory systems: Secondary | ICD-10-CM

## 2020-03-01 ENCOUNTER — Ambulatory Visit: Payer: Self-pay | Admitting: Cardiology

## 2020-03-08 ENCOUNTER — Other Ambulatory Visit: Payer: Self-pay

## 2020-03-08 ENCOUNTER — Ambulatory Visit: Payer: Self-pay | Admitting: Cardiology

## 2020-03-21 NOTE — Progress Notes (Deleted)
Patient referred by Bartholome Bill, MD for ***  Subjective:   Frances Cabrera, female    DOB: [birth year], 74 y.o.   MRN: [id number]  *** No chief complaint on file.   *** HPI  74 y.o. *** female with ***  *** Past Medical History:  Diagnosis Date  . Diabetes mellitus   . Elevated LFTs   . Hypertension   . Menopause   . Microalbuminuria   . Obesity   . Recurrent UTI    post-coital    *** Past Surgical History:  Procedure Laterality Date  . CHOLECYSTECTOMY  2000  . TUBAL LIGATION      *** Social History   Tobacco Use  Smoking Status Never Smoker  Smokeless Tobacco Never Used    Social History   Substance and Sexual Activity  Alcohol Use No  . Alcohol/week: 0.0 standard drinks    *** Family History  Problem Relation Age of Onset  . Diabetes Mother   . Hypertension Mother   . Kidney failure Mother   . Diabetes Father   . Diabetes Brother   . Diabetes Brother     *** Current Outpatient Medications on File Prior to Visit  Medication Sig Dispense Refill  . acetaminophen (TYLENOL) 500 MG tablet Take 750 mg by mouth every 6 (six) hours as needed for moderate pain or headache. Reported on 05/31/2016    . Blood Glucose Monitoring Suppl (ACCU-CHEK AVIVA PLUS) w/Device KIT USE AS DIRECTED 1 kit 0  . Blood Pressure Monitoring (B-D ASSURE BPM/AUTO WRIST CUFF) MISC Use to measure home blood pressure. 1 each 0  . cloNIDine (CATAPRES) 0.2 MG tablet TAKE 1 TABLET EVERY DAY 90 tablet 3  . cloNIDine (CATAPRES) 0.2 MG tablet Take 1 tablet (0.2 mg total) by mouth daily. 15 tablet 0  . furosemide (LASIX) 20 MG tablet Take 1 tablet (20 mg total) by mouth daily. 3 tablet 0  . glucose blood test strip Use to test blood sugar once daily. E11.9 100 each 3  . hydrocortisone (PROCTO-PAK) 1 % CREA Apply as directed to symptomatic hemorrhoids (Patient taking differently: Apply 1 application topically daily as needed (symptomatic hemorrhoids). ) 28.35 g 3  .  Ketotifen Fumarate (ALAWAY OP) Place 1 drop into both eyes 2 (two) times daily.    Elmore Guise Device MISC Use for home glucose monitoring 1 each 3  . Lancets (ONETOUCH ULTRASOFT) lancets Use to test blood sugar once daily. E11.9 100 each 3  . Lancets Misc. (ACCU-CHEK SOFTCLIX LANCET DEV) KIT USE FOR HOME GLUCOSE MONITORING 1 kit 0  . lisinopril (PRINIVIL,ZESTRIL) 20 MG tablet Take 1 tablet (20 mg total) by mouth daily. OFFICE VISIT NEEDED FOR REFILLS 30 tablet 0  . lisinopril (PRINIVIL,ZESTRIL) 20 MG tablet Take 1 tablet (20 mg total) by mouth daily. 15 tablet 0  . LORazepam (ATIVAN) 1 MG tablet Take 1 tablet (1 mg total) by mouth every 6 (six) hours as needed for anxiety. (Patient not taking: Reported on 12/08/2018) 10 tablet 0  . metFORMIN (GLUCOPHAGE) 1000 MG tablet Take 1 tablet (1,000 mg total) by mouth 2 (two) times daily. 60 tablet 0  . metFORMIN (GLUCOPHAGE) 1000 MG tablet Take 1 tablet (1,000 mg total) by mouth 2 (two) times daily. 30 tablet 0  . Multiple Vitamins-Minerals (MULTIVITAMIN WITH MINERALS) tablet Take 1 tablet by mouth daily. Centrum Silver    . oxyCODONE-acetaminophen (PERCOCET) 5-325 MG tablet Take 1 tablet by mouth every 4 (four) hours as needed for moderate pain. (  Patient not taking: Reported on 12/08/2018) 15 tablet 0  . pantoprazole (PROTONIX) 20 MG tablet TAKE 1 TABLET TWICE DAILY BEFORE A MEAL (Patient not taking: Reported on 12/08/2018) 60 tablet 0  . predniSONE (DELTASONE) 20 MG tablet Take 2 tablets (40 mg total) by mouth daily. 10 tablet 0  . triamterene-hydrochlorothiazide (DYAZIDE) 37.5-25 MG capsule TAKE 1 CAPSULE EVERY DAY 90 capsule 3  . triamterene-hydrochlorothiazide (MAXZIDE-25) 37.5-25 MG tablet Take 1 tablet by mouth daily. 15 tablet 0  . verapamil (CALAN-SR) 240 MG CR tablet TAKE 1 TABLET TWICE DAILY 180 tablet 3  . verapamil (CALAN-SR) 240 MG CR tablet Take 1 tablet (240 mg total) by mouth 2 (two) times daily. 30 tablet 0   No current facility-administered  medications on file prior to visit.    Cardiovascular and other pertinent studies:  *** EKG ***/***/202***: *** EKG: 12/08/2019 *** Recent labs: 01/26/2020: Glucose 190, BUN/Cr 12/0.95. EGFR 70. Na/K 138/3.7.  AST: 56, Anoin Gap: 16, Total Protein: 8.7 H/H 11.1/33.8. MCV 74.8. Platelets 387 HbA1C 7.6% Chol 177, TG 193, HDL 51, LDL 87  09/09/2019: TSH 5.17 normal   *** ROS      *** There were no vitals filed for this visit.  *** There is no height or weight on file to calculate BMI. There were no vitals filed for this visit.  *** Objective:   Physical Exam    ***     Assessment & Recommendations:   ***  ***     Thank you for referring the patient to [place]. Please feel free to contact with any questions.  Nigel Mormon, MD [facility] Pager: [phone] Office: [phone]

## 2020-03-22 ENCOUNTER — Ambulatory Visit: Payer: Self-pay | Admitting: Cardiology

## 2020-03-22 ENCOUNTER — Other Ambulatory Visit: Payer: Self-pay

## 2020-04-05 ENCOUNTER — Other Ambulatory Visit: Payer: Self-pay

## 2020-04-05 ENCOUNTER — Ambulatory Visit: Payer: Medicare HMO

## 2020-04-05 DIAGNOSIS — R0989 Other specified symptoms and signs involving the circulatory and respiratory systems: Secondary | ICD-10-CM

## 2020-04-12 ENCOUNTER — Ambulatory Visit: Payer: Self-pay | Admitting: Cardiology

## 2020-04-25 NOTE — Progress Notes (Signed)
Date:  04/26/2020   ID:  Frances Cabrera, DOB 14-Nov-1946, MRN [id number]  PCP:  Bartholome Bill, MD  Cardiologist:  Rex Kras, DO, [facility] (established care 04/26/2020)  REASON FOR CONSULT: Hypertension management.  REQUESTING PHYSICIAN:  Bartholome Bill, MD [address] [place],  [place] [address]  Chief Complaint  Patient presents with  . Hypertension  . New Patient (Initial Visit)    HPI  Frances Cabrera is a 74 y.o. female who is being seen today for the evaluation of hypertension management at the request of Bartholome Bill, MD. Patient's past medical history and cardiac risk factors include: Hypertensive urgency, non-insulin-dependent diabetes mellitus type 2, obesity, paroxysmal atrial flutter newly diagnosed, obesity due to excess calories, postmenopausal female, advanced age.  Patient presents to the office at the request of her primary care provider for the management of hypertension.  Patient states that she has had high blood pressure for approximately 40 years.  And to the best of her knowledge she thinks it has been uncontrolled for the last 1 year.  Patient states that whenever she goes to the doctor's office her blood pressures are always elevated but they are normal at home.  When asked what her blood pressure runs at home she states that she does not check it.  Patient states that her blood pressures are usually elevated when she is feeling very anxious.  She denies any focal neurological deficits, no vision changes, no pain between the shoulder blades, and appropriate urine output.  Patient states that she does consume a high salt diet with TV dinners.  Patient states that she was having pounding in the chest when she came in to the office because she was nervous seeing a new provider.  EKG on arrival showed atrial flutter which is a new finding for the patient.  I had her relax for 15 to 20 minutes and repeat an EKG the underlying  rhythm is sinus tachycardia.  Patient states that she does get frequent episodes of pounding chest discomfort similar to when she came into the office.  Patient also saw her primary care provider earlier this morning and new medication for her blood pressure has been started and she also had blood work done earlier this morning.   No family history of premature coronary disease or sudden cardiac death.  Denies prior history of coronary artery disease, myocardial infarction, congestive heart failure, deep venous thrombosis, pulmonary embolism, stroke, transient ischemic attack.  FUNCTIONAL STATUS: Patient walks a mile every other day and approximately 1 hour to do so.  ALLERGIES: Allergies  Allergen Reactions  . Penicillins Hives    Has patient had a PCN reaction causing immediate rash, facial/tongue/throat swelling, SOB or lightheadedness with hypotension: Yes Has patient had a PCN reaction causing severe rash involving mucus membranes or skin necrosis: Yes Has patient had a PCN reaction that required hospitalization No Has patient had a PCN reaction occurring within the last 10 years: No If all of the above answers are "NO", then may proceed with Cephalosporin use.   . Iodinated Diagnostic Agents Rash  . Shrimp [Shellfish Allergy] Rash    MEDICATION LIST PRIOR TO VISIT: Current Meds  Medication Sig  . acetaminophen (TYLENOL) 500 MG tablet Take 750 mg by mouth every 6 (six) hours as needed for moderate pain or headache. Reported on 05/31/2016  . Blood Glucose Monitoring Suppl (ACCU-CHEK AVIVA PLUS) w/Device KIT USE AS DIRECTED  . cloNIDine (CATAPRES) 0.2 MG tablet TAKE  1 TABLET EVERY DAY (Patient taking differently: Take 0.2 mg by mouth 3 (three) times daily. )  . gabapentin (NEURONTIN) 300 MG capsule Take 300 mg by mouth in the morning and at bedtime.  Marland Kitchen glucose blood test strip Use to test blood sugar once daily. E11.9  . hydrocortisone (PROCTO-PAK) 1 % CREA Apply as directed to  symptomatic hemorrhoids (Patient taking differently: Apply 1 application topically daily as needed (symptomatic hemorrhoids). )  . Ketotifen Fumarate (ALAWAY OP) Place 1 drop into both eyes 2 (two) times daily.  Elmore Guise Device MISC Use for home glucose monitoring  . Lancets (ONETOUCH ULTRASOFT) lancets Use to test blood sugar once daily. E11.9  . Lancets Misc. (ACCU-CHEK SOFTCLIX LANCET DEV) KIT USE FOR HOME GLUCOSE MONITORING  . metFORMIN (GLUCOPHAGE) 1000 MG tablet Take 1 tablet (1,000 mg total) by mouth 2 (two) times daily.  . Multiple Vitamins-Minerals (MULTIVITAMIN WITH MINERALS) tablet Take 1 tablet by mouth daily. Centrum Silver  . traMADol (ULTRAM) 50 MG tablet Take 1 tablet by mouth as needed.  . triamterene-hydrochlorothiazide (DYAZIDE) 37.5-25 MG capsule TAKE 1 CAPSULE EVERY DAY  . verapamil (CALAN-SR) 240 MG CR tablet TAKE 1 TABLET TWICE DAILY     PAST MEDICAL HISTORY: Past Medical History:  Diagnosis Date  . Diabetes mellitus   . Elevated LFTs   . Hypertension   . Menopause   . Microalbuminuria   . Obesity   . Recurrent UTI    post-coital    PAST SURGICAL HISTORY: Past Surgical History:  Procedure Laterality Date  . CHOLECYSTECTOMY  2000  . TUBAL LIGATION      FAMILY HISTORY: The patient family history includes Diabetes in her brother, brother, father, and mother; Hypertension in her mother; Kidney failure in her mother.  SOCIAL HISTORY:  The patient  reports that she has never smoked. She has never used smokeless tobacco. She reports that she does not drink alcohol or use drugs.  REVIEW OF SYSTEMS: Review of Systems  Constitution: Negative for chills and fever.  HENT: Negative for hoarse voice and nosebleeds.   Eyes: Negative for discharge, double vision and pain.  Cardiovascular: Negative for chest pain, claudication, dyspnea on exertion, leg swelling, near-syncope, orthopnea, palpitations, paroxysmal nocturnal dyspnea and syncope.  Respiratory: Negative  for hemoptysis and shortness of breath.   Musculoskeletal: Negative for muscle cramps and myalgias.  Gastrointestinal: Negative for abdominal pain, constipation, diarrhea, hematemesis, hematochezia, melena, nausea and vomiting.  Neurological: Negative for dizziness and light-headedness.    PHYSICAL EXAM: Vitals with BMI 04/26/2020 04/26/2020 12/08/2019  Height - 5' 11" -  Weight - 238 lbs -  BMI - 94.70 -  Systolic [phone]  Diastolic 476 546 81  Pulse 137 140 110   CONSTITUTIONAL: Well-developed and well-nourished. No acute distress.  SKIN: Skin is warm and dry. No rash noted. No cyanosis. No pallor. No jaundice HEAD: Normocephalic and atraumatic.  EYES: No scleral icterus MOUTH/THROAT: Moist oral membranes.  NECK: No JVD present. No thyromegaly noted. No carotid bruits  LYMPHATIC: No visible cervical adenopathy.  CHEST Normal respiratory effort. No intercostal retractions  LUNGS: Clear to auscultation bilaterally.  No stridor. No wheezes. No rales.  CARDIOVASCULAR: Tachycardia, regular, positive S1-S2, no murmurs rubs or gallops appreciated secondary to tachycardia. ABDOMINAL: Obese, soft, nontender, nondistended, positive bowel sounds in all 4 quadrants no apparent ascites.  EXTREMITIES: No peripheral edema  HEMATOLOGIC: No significant bruising NEUROLOGIC: Oriented to person, place, and time. Nonfocal. Normal muscle tone.  PSYCHIATRIC: Normal mood and affect.  Normal behavior. Cooperative  CARDIAC DATABASE: EKG: 04/26/2020 11:14 AM: Typical atrial flutter 2-1, 136 bpm, right bundle branch block.  04/26/2020 12:17 PM: Sinus tachycardia, ventricular rate 121 bpm, normal axis, right bundle branch block, no underlying injury pattern.  Compared to prior EKG typical atrial flutter has replaced normal sinus rhythm.  Echocardiogram: None  Stress Testing: None  Heart Catheterization: None  Vascular imaging: Lower Extremity Arterial Duplex 04/05/2020:  No hemodynamically  significant stenoses are identified in the lower extremity arterial system. This exam reveals normal perfusion of the bilateral lower extremity (ABI 1.00).    Ultrasound venous duplex: Lower Venous Study 08/31/2019: Left: There is no evidence of deep vein thrombosis in the lower extremity. However, portions of this examination were limited- see technologist comments above. No cystic structure found in the popliteal fossa.   LABORATORY DATA: CBC Latest Ref Rng & Units 12/08/2019 08/31/2019 02/25/2019  WBC 4.0 - 10.5 K/uL 5.6 6.3 6.3  Hemoglobin 12.0 - 15.0 g/dL 12.2 12.6 12.0  Hematocrit 36.0 - 46.0 % 38.1 40.8 39.4  Platelets 150 - 400 K/uL 322 343 305    CMP Latest Ref Rng & Units 12/08/2019 08/31/2019 02/25/2019  Glucose 70 - 99 mg/dL 159(H) 251(H) 280(H)  BUN 8 - 23 mg/dL _0 Creatinine 0.44 - 1.00 mg/dL 1.29(H) 0.85 0.75  Sodium 135 - 145 mmol/L 137 136 137  Potassium 3.5 - 5.1 mmol/L 3.4(L) 3.5 3.4(L)  Chloride 98 - 111 mmol/L 98 100 101  CO2 22 - 32 mmol/L 20(L) 20(L) 22  Calcium 8.9 - 10.3 mg/dL 9.5 9.8 9.5  Total Protein 6.5 - 8.1 g/dL 8.5(H) 8.9(H) -  Total Bilirubin 0.3 - 1.2 mg/dL 1.1 0.7 -  Alkaline Phos 38 - 126 U/L 43 56 -  AST 15 - 41 U/L 76(H) 70(H) -  ALT 0 - 44 U/L 65(H) 66(H) -    Lipid Panel     Component Value Date/Time   CHOL 187 11/18/2016 1735   TRIG 142 11/18/2016 1735   HDL 56 11/18/2016 1735   CHOLHDL 3.3 11/18/2016 1735   CHOLHDL 4.1 09/05/2015 1609   VLDL 31 (H) 09/05/2015 1609   LDLCALC 103 (H) 11/18/2016 1735   LABVLDL 28 11/18/2016 1735    Lab Results  Component Value Date   HGBA1C 7.0 (H) 11/18/2016   HGBA1C 8.1 (H) 05/31/2016   HGBA1C 8.1 11/28/2015   No components found for: NTPROBNP Lab Results  Component Value Date   TSH 3.222 11/05/2016   TSH 3.10 05/31/2016   TSH 3.610 09/05/2015    BMP Recent Labs    08/31/19 0835 12/08/19 1038  NA 136 137  K 3.5 3.4*  CL 100 98  CO2 20* 20*  GLUCOSE 251* 159*  BUN 10 13    CREATININE 0.85 1.29*  CALCIUM 9.8 9.5  GFRNONAA >60 42*  GFRAA >60 48*    HEMOGLOBIN A1C Lab Results  Component Value Date   HGBA1C 7.0 (H) 11/18/2016   MPG 186 05/31/2016   External Labs: Collected: [facility] 01/26/2020 Creatinine 0.95 mg/dL. eGFR: 70 mL/min per 1.73 m Lipid profile: Total cholesterol 177, triglycerides 193, HDL 51, LDL 87, non-HDL 126 WBC 10.2, RBC 4.52, Hb 11.1, Hct 33.8, MCV 74.8, Platelets387.  Hemoglobin A1c 7.7  Collected: 04/26/2020 Potassium 3.8. Creatinine 0.98. GFR 67. AST 41, ALT 42  IMPRESSION:    ICD-10-CM   1. Hypertensive urgency  I16.0 hydrALAZINE (APRESOLINE) 25 MG tablet  2. Non-insulin dependent type 2 diabetes  mellitus ([place])  E11.9   3. Class 1 obesity due to excess calories with serious comorbidity and body mass index (BMI) of 33.0 to 33.9 in adult  E66.09    Z68.33   4. Paroxysmal atrial flutter (HCC)  I48.92 metoprolol succinate (TOPROL-XL) 50 MG 24 hr tablet    apixaban (ELIQUIS) 5 MG TABS tablet    TSH    TSH  5. Long term (current) use of anticoagulants  Z79.01 apixaban (ELIQUIS) 5 MG TABS tablet     RECOMMENDATIONS: Frances Cabrera is a 74 y.o. female whose past medical history and cardiac risk factors include: Hypertensive urgency, non-insulin-dependent diabetes mellitus type 2, obesity, paroxysmal atrial flutter newly diagnosed, obesity due to excess calories, postmenopausal female, advanced age.  Hypertensive urgency:  Patient's blood pressure is not well controlled.  She is referred to the office at the request of her primary care provider for management of hypertension.  Educated on the importance of a low-salt diet.  Patient was started on valsartan earlier this morning when she visited her PCP office.  We'll start hydralazine 25 mg p.o. 3 times daily.  Repeat BMP and magnesium in 1 week to evaluate kidney function and electrolytes.  Patient is enrolled into principal care  management for ambulatory blood pressure monitoring.  Paroxysmal atrial flutter:  Newly diagnosed at today's visit.  Rate control: Currently on verapamil.  We'll start Toprol-XL 25 mg p.o. every morning  Rhythm control: N/A.  Thromboembolic prophylaxis: We'll start Eliquis 5 mg p.o. twice daily  I had a long and detailed discussion with the patient regarding the incidence, etiology, pathophysiology, prognosis, and therapeutic options for atrial flutter.  Specifically we discussed oral anticoagulation for stroke prevention and also discussed the Watchman device.  CHA2DS2-VASc SCORE is 4 which correlates to 4% risk of stroke per year.  I reviewed with patient the benefits and risks of initiating anticoagulation rx, and based on a flutter treatment guidelines, I recommended long-term anticoagulation for stroke prevention.  Patient agreed.  Echocardiogram will be ordered to evaluate for structural heart disease and left ventricular systolic function.  Patient will be scheduled for a stress test once her blood pressure is better controlled and ventricular rate improved  Check TSH  Long-term oral anticoagulation:  Indication paroxysmal atrial flutter.  Reviewed the risks, benefits, and alternatives to oral anticoagulation at today's visit.  Patient does not endorse any evidence of bleeding.  No prior history of intracranial or internal bleeding.  Most recent hemoglobin from February 2021 reviewed  Non-insulin-dependent diabetes mellitus type 2: Currently managed by primary team.  Obesity, due to excess calories: Body mass index is 33.19 kg/m. . I reviewed with the patient the importance of diet, regular physical activity/exercise, weight loss.   . Patient is educated on increasing physical activity gradually as tolerated.  With the goal of moderate intensity exercise for 30 minutes a day 5 days a week.  FINAL MEDICATION LIST END OF ENCOUNTER: Meds ordered this encounter    Medications  . metoprolol succinate (TOPROL-XL) 50 MG 24 hr tablet    Sig: Take 1 tablet (50 mg total) by mouth in the morning. Hold if systolic blood pressure (top blood pressure number) less than 100 mmHg or heart rate less than 60 bpm (pulse).    Dispense:  30 tablet    Refill:  0  . hydrALAZINE (APRESOLINE) 25 MG tablet    Sig: Take 1 tablet (25 mg total) by mouth 3 (three) times daily.    Dispense:  270 tablet  Refill:  3  . apixaban (ELIQUIS) 5 MG TABS tablet    Sig: Take 1 tablet (5 mg total) by mouth 2 (two) times daily.    Dispense:  60 tablet    Refill:  0     Current Outpatient Medications:  .  acetaminophen (TYLENOL) 500 MG tablet, Take 750 mg by mouth every 6 (six) hours as needed for moderate pain or headache. Reported on 05/31/2016, Disp: , Rfl:  .  Blood Glucose Monitoring Suppl (ACCU-CHEK AVIVA PLUS) w/Device KIT, USE AS DIRECTED, Disp: 1 kit, Rfl: 0 .  cloNIDine (CATAPRES) 0.2 MG tablet, TAKE 1 TABLET EVERY DAY (Patient taking differently: Take 0.2 mg by mouth 3 (three) times daily. ), Disp: 90 tablet, Rfl: 3 .  gabapentin (NEURONTIN) 300 MG capsule, Take 300 mg by mouth in the morning and at bedtime., Disp: , Rfl:  .  glucose blood test strip, Use to test blood sugar once daily. E11.9, Disp: 100 each, Rfl: 3 .  hydrocortisone (PROCTO-PAK) 1 % CREA, Apply as directed to symptomatic hemorrhoids (Patient taking differently: Apply 1 application topically daily as needed (symptomatic hemorrhoids). ), Disp: 28.35 g, Rfl: 3 .  Ketotifen Fumarate (ALAWAY OP), Place 1 drop into both eyes 2 (two) times daily., Disp: , Rfl:  .  Lancet Device MISC, Use for home glucose monitoring, Disp: 1 each, Rfl: 3 .  Lancets (ONETOUCH ULTRASOFT) lancets, Use to test blood sugar once daily. E11.9, Disp: 100 each, Rfl: 3 .  Lancets Misc. (ACCU-CHEK SOFTCLIX LANCET DEV) KIT, USE FOR HOME GLUCOSE MONITORING, Disp: 1 kit, Rfl: 0 .  metFORMIN (GLUCOPHAGE) 1000 MG tablet, Take 1 tablet (1,000 mg  total) by mouth 2 (two) times daily., Disp: 60 tablet, Rfl: 0 .  Multiple Vitamins-Minerals (MULTIVITAMIN WITH MINERALS) tablet, Take 1 tablet by mouth daily. Centrum Silver, Disp: , Rfl:  .  traMADol (ULTRAM) 50 MG tablet, Take 1 tablet by mouth as needed., Disp: , Rfl:  .  triamterene-hydrochlorothiazide (DYAZIDE) 37.5-25 MG capsule, TAKE 1 CAPSULE EVERY DAY, Disp: 90 capsule, Rfl: 3 .  verapamil (CALAN-SR) 240 MG CR tablet, TAKE 1 TABLET TWICE DAILY, Disp: 180 tablet, Rfl: 3 .  apixaban (ELIQUIS) 5 MG TABS tablet, Take 1 tablet (5 mg total) by mouth 2 (two) times daily., Disp: 60 tablet, Rfl: 0 .  Blood Pressure Monitoring (B-D ASSURE BPM/AUTO WRIST CUFF) MISC, Use to measure home blood pressure. (Patient not taking: Reported on 04/26/2020), Disp: 1 each, Rfl: 0 .  hydrALAZINE (APRESOLINE) 25 MG tablet, Take 1 tablet (25 mg total) by mouth 3 (three) times daily., Disp: 270 tablet, Rfl: 3 .  metoprolol succinate (TOPROL-XL) 50 MG 24 hr tablet, Take 1 tablet (50 mg total) by mouth in the morning. Hold if systolic blood pressure (top blood pressure number) less than 100 mmHg or heart rate less than 60 bpm (pulse)., Disp: 30 tablet, Rfl: 0 .  potassium chloride (KLOR-CON) 10 MEQ tablet, Take 10 mEq by mouth daily., Disp: , Rfl:  .  valsartan (DIOVAN) 80 MG tablet, Take 80 mg by mouth daily., Disp: , Rfl:   Orders Placed This Encounter  Procedures  . Basic metabolic panel  . Magnesium  . TSH  . EKG 12-Lead  . EKG 12-Lead  . PCV ECHOCARDIOGRAM COMPLETE    Patient Instructions  Please remember to bring in your medication bottles in at the next visit.   New Medications that were added at today's visit:  Hydralazine 25 mg p.o. 3 times daily. Toprol-XL 50 mg  p.o. every morning Eliquis 5 mg p.o. twice daily  Medications that were discontinued at today's visit: None   Office will call you to have the following tests scheduled:  Echocardiogram  Please get labs done in about 1 week after  starting the new blood pressure pills at the nearest [place].  Recommend follow up with your PCP as scheduled.    --Continue cardiac medications as reconciled in final medication list. --Return in about 11 days (around 05/07/2020) for BP follow up.. Or sooner if needed. --Continue follow-up with your primary care physician regarding the management of your other chronic comorbid conditions.  Total encounter time 60 minutes.  *Total Encounter Time as defined by the Centers for Medicare and Medicaid Services includes, in addition to the face-to-face time of a patient visit (documented in the note above) non-face-to-face time: obtaining and reviewing outside history, ordering and reviewing medications, tests or procedures, care coordination (communications with other health care professionals or caregivers) and documentation in the medical record.  Patient's questions and concerns were addressed to her satisfaction. She voices understanding of the instructions provided during this encounter.   This note was created using a voice recognition software as a result there may be grammatical errors inadvertently enclosed that do not reflect the nature of this encounter. Every attempt is made to correct such errors.  Rex Kras, [place], [facility]  Pager: [phone] Office: [phone]

## 2020-04-26 ENCOUNTER — Ambulatory Visit: Payer: Medicare HMO | Admitting: Cardiology

## 2020-04-26 ENCOUNTER — Encounter: Payer: Self-pay | Admitting: Cardiology

## 2020-04-26 ENCOUNTER — Other Ambulatory Visit: Payer: Self-pay

## 2020-04-26 VITALS — BP 192/110 | HR 137 | Ht 71.0 in | Wt 238.0 lb

## 2020-04-26 DIAGNOSIS — Z7901 Long term (current) use of anticoagulants: Secondary | ICD-10-CM

## 2020-04-26 DIAGNOSIS — E119 Type 2 diabetes mellitus without complications: Secondary | ICD-10-CM

## 2020-04-26 DIAGNOSIS — Z6833 Body mass index (BMI) 33.0-33.9, adult: Secondary | ICD-10-CM

## 2020-04-26 DIAGNOSIS — E6609 Other obesity due to excess calories: Secondary | ICD-10-CM

## 2020-04-26 DIAGNOSIS — I160 Hypertensive urgency: Secondary | ICD-10-CM

## 2020-04-26 DIAGNOSIS — I4892 Unspecified atrial flutter: Secondary | ICD-10-CM

## 2020-04-26 MED ORDER — METOPROLOL SUCCINATE ER 50 MG PO TB24: 50.0000 mg | ORAL_TABLET | Freq: Every morning | ORAL | 0 refills | Status: DC

## 2020-04-26 MED ORDER — HYDRALAZINE HCL 25 MG PO TABS: 25.0000 mg | ORAL_TABLET | Freq: Three times a day (TID) | ORAL | 3 refills | Status: DC

## 2020-04-26 MED ORDER — APIXABAN 5 MG PO TABS: 5.0000 mg | ORAL_TABLET | Freq: Two times a day (BID) | ORAL | 0 refills | Status: DC

## 2020-04-26 NOTE — Patient Instructions (Addendum)
Please remember to bring in your medication bottles in at the next visit.   New Medications that were added at today's visit:  Hydralazine 25 mg p.o. 3 times daily. Toprol-XL 50 mg p.o. every morning Eliquis 5 mg p.o. twice daily  Medications that were discontinued at today's visit: None   Office will call you to have the following tests scheduled:  Echocardiogram  Please get labs done in about 1 week after starting the new blood pressure pills at the nearest [facility].  Recommend follow up with your PCP as scheduled.

## 2020-04-30 ENCOUNTER — Telehealth: Payer: Self-pay | Admitting: Pharmacist

## 2020-04-30 NOTE — Telephone Encounter (Signed)
BP readings reviewed. BP remain elevated over the weekend, but trending down. Called to review with pt. Pt states that she is feeling well overall and has no complains of CP, HA, SOB, edmea, lightheadedness, dizziness. Reports to be tolerating metoprolol and hydralazine well w/o any ADRs. Complains of recent night terrors and "bad dreams" since starting hydralazine and planning on taking evening hydralazine dose a bit earlier. Previously taking around 8-9 PM, but moving it to 6-7 PM to see if there is any link between the hydralazine and the sleep disturbance.  Complains of neuropathic leg pain and feet pain. On gabapentin, but pt states that it is only providing mild relief. Pain remains consistent and hasnt noticed any recent change in the frequency or severity of pain. Pt planning on f/u w/ PCP to review need for pain f/u. Med list reviewed and updated. Pt recently had an OV w/ her PCP and was asked to start taking valsartan. Pt picked it up over the weekend and was waiting for confirmation from our office before starting. BP remains elevated in the meantime and pt to start taking Valsartan 80 mg daily starting today. Other relevant antihypertensive medications include clonidine 0.2 mg BID (titrating down), hydralazine 25 mg TID, metoprolol 50 mg, triamterene-HCTZ  37.5/25 mg, valsartan 80 mg, and verapamil 240 mg BID. Pt planning on completing f/u lab work later this week. Next OV w/ Dr. Odis Hollingshead scheduled for 05/07/20. Will continue to monitor BP readings and follow up closely.

## 2020-05-01 ENCOUNTER — Other Ambulatory Visit: Payer: Medicare HMO

## 2020-05-02 NOTE — Telephone Encounter (Addendum)
BP readings reviewed. BP remains elevated and not at goal. Average BP reading of ~174.18/98.64. Average HR ~88. Pt recently started valsartan 80 mg (from PCP), metoporlol 50 mg, and hydralazine 25 mg TID. Discussed remote BP readings with Dr. Odis Hollingshead. Dr. Odis Hollingshead recommended adding Isordil 20 mg TID. Called to review recent BP readings and medication changes with pt. Called multiple times throughout the day with no response Unable to leave VM. Pt finally picked up at the end of they day. Reviewed BP readings with the pt. Pt reports that she hasnt started her valsartan 80 mg yet as previously discussed. Pt stated that she was frustrated with her pill burden and was hesitant to continuing to add more medications. Denies any CP, HA, SOB, edema, lightheadedness, dizziness. Pt insists that she is taking her other antihypertensive medications as prescribed. Reports that her "bad dreams" complains have improved after taking her hydralazine dose earlier in the evening.   Reviewed the indications of her antihypertensive medications and the role they play in managing her uncontrolled HTN. Discuss the potential complication and dangers of persistently elevated BP readings and the potential long-term implication on pt's overall health and mortality. Pt verbalized understanding and agreeable to starting to take valsartan regularly starting today. Will consider holding starting Isordil 20 mg TID after evaluating response from valsartan.   Pt stated that she has limited transport options and is only able to reliable transportion for Thursdays and Fridays. Would need to have all her OV and lab visits scheduled on those days in order to be compliant with her appts.  Has an upcoming f/u OV w/ Dr. Odis Hollingshead on Monday, 05/07/20. Instructed pt to call the office to reschedule the appt. Sent a message to office staff to confirm. Pt aware that she would need to go to [facility] to get blood work done prior to the next OV. Discussed the need to  ensure pt's electrolytes and kidney/liver functions remain WNL in order to be able to effectively manage and titrate her medications safely. Pt verbalized understanding. Will plan on following up with pt later this week or next week to review BP readings and response from starting valsartan daily.

## 2020-05-03 NOTE — Telephone Encounter (Signed)
Okay 

## 2020-05-07 ENCOUNTER — Ambulatory Visit: Payer: Medicare HMO | Admitting: Cardiology

## 2020-05-10 ENCOUNTER — Other Ambulatory Visit: Payer: Self-pay | Admitting: Cardiology

## 2020-05-10 ENCOUNTER — Other Ambulatory Visit: Payer: Self-pay

## 2020-05-10 ENCOUNTER — Ambulatory Visit: Payer: Medicare HMO

## 2020-05-10 ENCOUNTER — Encounter: Payer: Self-pay | Admitting: Cardiology

## 2020-05-10 ENCOUNTER — Ambulatory Visit: Payer: Medicare HMO | Admitting: Cardiology

## 2020-05-10 VITALS — BP 180/80 | HR 99 | Resp 17 | Ht 71.0 in | Wt 248.0 lb

## 2020-05-10 DIAGNOSIS — I4892 Unspecified atrial flutter: Secondary | ICD-10-CM

## 2020-05-10 DIAGNOSIS — Z6833 Body mass index (BMI) 33.0-33.9, adult: Secondary | ICD-10-CM

## 2020-05-10 DIAGNOSIS — Z7901 Long term (current) use of anticoagulants: Secondary | ICD-10-CM

## 2020-05-10 DIAGNOSIS — E119 Type 2 diabetes mellitus without complications: Secondary | ICD-10-CM

## 2020-05-10 DIAGNOSIS — E6609 Other obesity due to excess calories: Secondary | ICD-10-CM

## 2020-05-10 DIAGNOSIS — I10 Essential (primary) hypertension: Secondary | ICD-10-CM

## 2020-05-10 DIAGNOSIS — Z6834 Body mass index (BMI) 34.0-34.9, adult: Secondary | ICD-10-CM

## 2020-05-10 DIAGNOSIS — E66811 Obesity, class 1: Secondary | ICD-10-CM

## 2020-05-10 MED ORDER — METOPROLOL SUCCINATE ER 100 MG PO TB24: 100.0000 mg | ORAL_TABLET | Freq: Every morning | ORAL | 1 refills | Status: DC

## 2020-05-10 MED ORDER — BIDIL 20-37.5 MG PO TABS: 1.0000 | ORAL_TABLET | Freq: Three times a day (TID) | ORAL | 0 refills | Status: AC

## 2020-05-10 NOTE — Patient Instructions (Signed)
Week 1: Clonidine 0.2 mg p.o. daily Week 2: Clonidine 0.2 mg p.o. q. other day

## 2020-05-11 ENCOUNTER — Other Ambulatory Visit: Payer: Self-pay | Admitting: Family Medicine

## 2020-05-11 DIAGNOSIS — Z1231 Encounter for screening mammogram for malignant neoplasm of breast: Secondary | ICD-10-CM

## 2020-05-11 LAB — BASIC METABOLIC PANEL
BUN/Creatinine Ratio: 16 (ref 12–28)
BUN: 16 mg/dL (ref 8–27)
CO2: 22 mmol/L (ref 20–29)
Calcium: 10.4 mg/dL — ABNORMAL HIGH (ref 8.7–10.3)
Chloride: 98 mmol/L (ref 96–106)
Creatinine, Ser: 1.02 mg/dL — ABNORMAL HIGH (ref 0.57–1.00)
GFR calc Af Amer: 64 mL/min/{1.73_m2} (ref >59)
GFR calc non Af Amer: 55 mL/min/{1.73_m2} — ABNORMAL LOW (ref >59)
Glucose: 143 mg/dL — ABNORMAL HIGH (ref 65–99)
Potassium: 3.7 mmol/L (ref 3.5–5.2)
Sodium: 138 mmol/L (ref 134–144)

## 2020-05-11 LAB — MAGNESIUM: Magnesium: 1.5 mg/dL — ABNORMAL LOW (ref 1.6–2.3)

## 2020-05-11 LAB — TSH: TSH: 6.91 u[IU]/mL — ABNORMAL HIGH (ref 0.450–4.500)

## 2020-05-12 ENCOUNTER — Encounter: Payer: Self-pay | Admitting: Cardiology

## 2020-05-12 NOTE — Progress Notes (Signed)
Frances Cabrera Date of Birth: 01-Jul-1946 MRN: [id number] Primary Care Provider:Boyd, Dola Factor, MD Primary Cardiologist: Rex Kras, DO, [facility]  (established care 04/26/2020)  Date: 05/10/2020 Last Office Visit: 04/26/2020  Chief Complaint  Patient presents with  . Hypertension  . Follow-up    11 Days    HPI  Frances Cabrera is a 74 y.o. female who presents to the office with a  chief complaint of " hypertension follow-up."  Her past medical history and cardiovascular risk factors are: Hypertensive urgency, non-insulin-dependent diabetes mellitus type 2, obesity, paroxysmal atrial flutter newly diagnosed, obesity due to excess calories, postmenopausal female, advanced age..   Patient was originally referred to the office for evaluation of hypertension.  At the initial office visit patient noted that she has had high blood pressure for approximately 40 years.  And they have been uncontrolled for the past 1 year.  She states that the blood pressures when she goes to physicians offices are always elevated when she is at home they are within normal limits.  When asked what she considers normal blood pressures she states that she does not check her blood pressures at home.  At the last office visit patient was enrolled into principal care management for ambulatory blood pressure monitoring.  Review of the blood pressure log illustrates that her systolic blood pressures on Apr 28, 2020 at home 199/126.  However over the last several days her systolic blood pressures at home have been running between 696-295 mmHg and diastolic blood pressures between 71 and 81 mmHg.  Patient still consumes a diet that is high in salt.  She was educated on consuming a low-salt diet and looking into DASH diet at the last office visit.  She has decreased her clonidine to 0.2 mg p.o. twice daily.  Patient has not taken valsartan on a regular basis because it made her feel nauseated and vomiting when she  first took it.  She is also not taking Isordil because she wants to reduce the number of antihypertensive medication pills that she currently is taking.    At the last office visit patient was also noted to be in paroxysmal atrial flutter and was started on AV nodal blocking agents and after reviewing the risks, benefits, and alternatives to oral anticoagulation she chose to initiate oral anticoagulation with thromboembolic prophylaxis.  She was started on Eliquis 5 mg p.o. twice daily and has not had any issues in regards to bleeding.   Patient is scheduled to have an echocardiogram later today.  No family history of premature coronary disease or sudden cardiac death.  Denies prior history of coronary artery disease, myocardial infarction, congestive heart failure, deep venous thrombosis, pulmonary embolism, stroke, transient ischemic attack.  FUNCTIONAL STATUS: Patient walks a mile every other day and approximately 1 hour to do so.  ALLERGIES: Allergies  Allergen Reactions  . Penicillins Hives    Has patient had a PCN reaction causing immediate rash, facial/tongue/throat swelling, SOB or lightheadedness with hypotension: Yes Has patient had a PCN reaction causing severe rash involving mucus membranes or skin necrosis: Yes Has patient had a PCN reaction that required hospitalization No Has patient had a PCN reaction occurring within the last 10 years: No If all of the above answers are "NO", then may proceed with Cephalosporin use.   . Iodinated Diagnostic Agents Rash  . Shrimp [Shellfish Allergy] Rash    MEDICATION LIST PRIOR TO VISIT: Current Meds  Medication Sig  . acetaminophen (TYLENOL) 500 MG tablet Take 750  mg by mouth every 6 (six) hours as needed for moderate pain or headache. Reported on 05/31/2016  . apixaban (ELIQUIS) 5 MG TABS tablet Take 1 tablet (5 mg total) by mouth 2 (two) times daily.  . Blood Glucose Monitoring Suppl (ACCU-CHEK AVIVA PLUS) w/Device KIT USE AS  DIRECTED  . Blood Pressure Monitoring (B-D ASSURE BPM/AUTO WRIST CUFF) MISC Use to measure home blood pressure.  . cloNIDine (CATAPRES) 0.2 MG tablet TAKE 1 TABLET EVERY DAY (Patient taking differently: Take 0.2 mg by mouth 2 (two) times daily. )  . clotrimazole-betamethasone (LOTRISONE) cream Apply 1 application topically as needed for rash.  . gabapentin (NEURONTIN) 300 MG capsule Take 300 mg by mouth in the morning and at bedtime.  Marland Kitchen glucose blood test strip Use to test blood sugar once daily. E11.9  . hydrocortisone (PROCTO-PAK) 1 % CREA Apply as directed to symptomatic hemorrhoids (Patient taking differently: Apply 1 application topically daily as needed (symptomatic hemorrhoids). )  . Ketotifen Fumarate (ALAWAY OP) Place 1 drop into both eyes 2 (two) times daily.  Elmore Guise Device MISC Use for home glucose monitoring  . Lancets (ONETOUCH ULTRASOFT) lancets Use to test blood sugar once daily. E11.9  . Lancets Misc. (ACCU-CHEK SOFTCLIX LANCET DEV) KIT USE FOR HOME GLUCOSE MONITORING  . metFORMIN (GLUCOPHAGE) 1000 MG tablet Take 1 tablet (1,000 mg total) by mouth 2 (two) times daily.  . Multiple Vitamins-Minerals (MULTIVITAMIN WITH MINERALS) tablet Take 1 tablet by mouth daily. Centrum Silver  . potassium chloride (KLOR-CON) 10 MEQ tablet Take 10 mEq by mouth daily.  . traMADol (ULTRAM) 50 MG tablet Take 1 tablet by mouth as needed.  . triamterene-hydrochlorothiazide (DYAZIDE) 37.5-25 MG capsule TAKE 1 CAPSULE EVERY DAY  . verapamil (CALAN-SR) 240 MG CR tablet TAKE 1 TABLET TWICE DAILY  . [DISCONTINUED] hydrALAZINE (APRESOLINE) 25 MG tablet Take 1 tablet (25 mg total) by mouth 3 (three) times daily.  . [DISCONTINUED] metoprolol succinate (TOPROL-XL) 50 MG 24 hr tablet Take 1 tablet (50 mg total) by mouth in the morning. Hold if systolic blood pressure (top blood pressure number) less than 100 mmHg or heart rate less than 60 bpm (pulse).     PAST MEDICAL HISTORY: Past Medical History:    Diagnosis Date  . Diabetes mellitus   . Elevated LFTs   . Hypertension   . Menopause   . Microalbuminuria   . Obesity   . Paroxysmal atrial flutter ([place])   . Recurrent UTI    post-coital    PAST SURGICAL HISTORY: Past Surgical History:  Procedure Laterality Date  . CHOLECYSTECTOMY  2000  . TUBAL LIGATION      FAMILY HISTORY: The patient family history includes Diabetes in her brother, brother, father, and mother; Hypertension in her mother; Kidney failure in her mother.  SOCIAL HISTORY:  The patient  reports that she has never smoked. She has never used smokeless tobacco. She reports that she does not drink alcohol or use drugs.  REVIEW OF SYSTEMS: Review of Systems  Constitution: Negative for chills and fever.  HENT: Negative for hoarse voice and nosebleeds.   Eyes: Negative for discharge, double vision and pain.  Cardiovascular: Negative for chest pain, claudication, dyspnea on exertion, leg swelling, near-syncope, orthopnea, palpitations, paroxysmal nocturnal dyspnea and syncope.  Respiratory: Negative for hemoptysis and shortness of breath.   Musculoskeletal: Negative for muscle cramps and myalgias.  Gastrointestinal: Negative for abdominal pain, constipation, diarrhea, hematemesis, hematochezia, melena, nausea and vomiting.  Neurological: Negative for dizziness and light-headedness.  PHYSICAL EXAM: Vitals with BMI 05/10/2020 05/10/2020 04/26/2020  Height - 5' 11"  -  Weight - 248 lbs -  BMI - 49.1 -  Systolic 791 505 697  Diastolic 80 90 948  Pulse 99 100 137   CONSTITUTIONAL: Well-developed and well-nourished. No acute distress.  SKIN: Skin is warm and dry. No rash noted. No cyanosis. No pallor. No jaundice HEAD: Normocephalic and atraumatic.  EYES: No scleral icterus MOUTH/THROAT: Moist oral membranes.  NECK: No JVD present. No thyromegaly noted. No carotid bruits  LYMPHATIC: No visible cervical adenopathy.  CHEST Normal respiratory effort. No intercostal  retractions  LUNGS: Clear to auscultation bilaterally.  No stridor. No wheezes. No rales.  CARDIOVASCULAR: Tachycardia, regular, positive S1-S2, no murmurs rubs or gallops appreciated secondary to tachycardia. ABDOMINAL: Obese, soft, nontender, nondistended, positive bowel sounds in all 4 quadrants no apparent ascites.  EXTREMITIES: No peripheral edema  HEMATOLOGIC: No significant bruising NEUROLOGIC: Oriented to person, place, and time. Nonfocal. Normal muscle tone.  PSYCHIATRIC: Normal mood and affect. Normal behavior. Cooperative  CARDIAC DATABASE: EKG: 04/26/2020 11:14 AM: Typical atrial flutter 2-1, 136 bpm, right bundle branch block.  04/26/2020 12:17 PM: Sinus tachycardia, ventricular rate 121 bpm, normal axis, right bundle branch block, no underlying injury pattern.  Compared to prior EKG typical atrial flutter has replaced normal sinus rhythm.  Echocardiogram: None  Stress Testing: None  Heart Catheterization: None  Vascular imaging: Lower Extremity Arterial Duplex 04/05/2020:  No hemodynamically significant stenoses are identified in the lower extremity arterial system. This exam reveals normal perfusion of the bilateral lower extremity (ABI 1.00).    Ultrasound venous duplex: Lower Venous Study 08/31/2019: Left: There is no evidence of deep vein thrombosis in the lower extremity. However, portions of this examination were limited- see technologist comments above. No cystic structure found in the popliteal fossa.   LABORATORY DATA: CBC Latest Ref Rng & Units 12/08/2019 08/31/2019 02/25/2019  WBC 4.0 - 10.5 K/uL 5.6 6.3 6.3  Hemoglobin 12.0 - 15.0 g/dL 12.2 12.6 12.0  Hematocrit 36.0 - 46.0 % 38.1 40.8 39.4  Platelets 150 - 400 K/uL 322 343 305    CMP Latest Ref Rng & Units 12/08/2019 08/31/2019 02/25/2019  Glucose 70 - 99 mg/dL 159(H) 251(H) 280(H)  BUN 8 - 23 mg/dL 13 10 11   Creatinine 0.44 - 1.00 mg/dL 1.29(H) 0.85 0.75  Sodium 135 - 145 mmol/L 137 136 137    Potassium 3.5 - 5.1 mmol/L 3.4(L) 3.5 3.4(L)  Chloride 98 - 111 mmol/L 98 100 101  CO2 22 - 32 mmol/L 20(L) 20(L) 22  Calcium 8.9 - 10.3 mg/dL 9.5 9.8 9.5  Total Protein 6.5 - 8.1 g/dL 8.5(H) 8.9(H) -  Total Bilirubin 0.3 - 1.2 mg/dL 1.1 0.7 -  Alkaline Phos 38 - 126 U/L 43 56 -  AST 15 - 41 U/L 76(H) 70(H) -  ALT 0 - 44 U/L 65(H) 66(H) -    Lipid Panel     Component Value Date/Time   CHOL 187 11/18/2016 1735   TRIG 142 11/18/2016 1735   HDL 56 11/18/2016 1735   CHOLHDL 3.3 11/18/2016 1735   CHOLHDL 4.1 09/05/2015 1609   VLDL 31 (H) 09/05/2015 1609   LDLCALC 103 (H) 11/18/2016 1735   LABVLDL 28 11/18/2016 1735    Lab Results  Component Value Date   HGBA1C 7.0 (H) 11/18/2016   HGBA1C 8.1 (H) 05/31/2016   HGBA1C 8.1 11/28/2015   No components found for: NTPROBNP Lab Results  Component Value Date   TSH  3.222 11/05/2016   TSH 3.10 05/31/2016   TSH 3.610 09/05/2015   HEMOGLOBIN A1C Lab Results  Component Value Date   HGBA1C 7.0 (H) 11/18/2016   MPG 186 05/31/2016   External Labs: Collected: [facility] 01/26/2020 Creatinine 0.95 mg/dL. eGFR: 70 mL/min per 1.73 m Lipid profile: Total cholesterol 177, triglycerides 193, HDL 51, LDL 87, non-HDL 126 WBC 10.2, RBC 4.52, Hb 11.1, Hct 33.8, MCV 74.8, Platelets387.  Hemoglobin A1c 7.7  Collected: 04/26/2020 Potassium 3.8. Creatinine 0.98. GFR 67. AST 41, ALT 42  IMPRESSION:    ICD-10-CM   1. Benign hypertension  I10 isosorbide-hydrALAZINE (BIDIL) 20-37.5 MG tablet    metoprolol succinate (TOPROL-XL) 100 MG 24 hr tablet    PCV RENAL/RENAL ARTERY DUPLEX COMPLETE    Basic metabolic panel    Magnesium    Pro b natriuretic peptide (BNP)    Pro b natriuretic peptide (BNP)    Magnesium    Basic metabolic panel  2. Paroxysmal atrial flutter (H[facility])  I48.92 TSH  3. Long term (current) use of anticoagulants  Z79.01   4. Non-insulin dependent type 2 diabetes mellitus ([place])  E11.9   5. Class 1  obesity due to excess calories with serious comorbidity and body mass index (BMI) of 33.0 to 33.9 in adult  E66.09    Z68.33   6. Class 1 obesity due to excess calories with serious comorbidity and body mass index (BMI) of 34.0 to 34.9 in adult  E66.09    Z68.34      RECOMMENDATIONS: Frances Cabrera is a 74 y.o. female whose past medical history and cardiac risk factors include: Hypertensive urgency, non-insulin-dependent diabetes mellitus type 2, obesity, paroxysmal atrial flutter newly diagnosed, obesity due to excess calories, postmenopausal female, advanced age.  Benign essential hypertension:  Patient's blood pressure is not well controlled.  But home blood pressures are improving.  Educated on the importance of a low-salt diet.  Patient was prescribed valsartan at the last office visit with her PCP; however, patient is not taking it as it made her nauseous.  Patient has weaned down on clonidine.  Initially taking clonidine 0.2 mg p.o. 3 times daily and now she is taking clonidine 0.2 mg p.o. twice daily.  Goal is to hopefully wean her off of clonidine.  As described below  Discussed in regards to initiating ice Isordil.  However, patient states that she wants to reduce the number of pills.  We will start BiDil 20/37.5 mg p.o. 3 times daily.  Check renal duplex.  Patient is enrolled into principal care management for ambulatory blood pressure monitoring.  We will continue to monitor her blood pressures.  Paroxysmal atrial flutter:  Rate control: Currently on verapamil.  Uptitrate Toprol-XL.  Rhythm control: N/A.  Thromboembolic prophylaxis: Eliquis 5 mg p.o. twice daily  CHA2DS2-VASc SCORE is 4 which correlates to 4% risk of stroke per year.  Reemphasized the risks, benefits, and alternatives to oral anticoagulation.  Patient verbalized understanding.  Does not endorse any evidence of bleeding.    Patient is scheduled for an echocardiogram today.   Patient will be  scheduled for a stress test once her blood pressure is better controlled and ventricular rate improved  Recommend checking TSH at last visit, however this has not been completed.  Will reorder.  Long-term oral anticoagulation:  Indication paroxysmal atrial flutter.  Reviewed the risks, benefits, and alternatives to oral anticoagulation at today's visit.  Patient does not endorse any evidence of bleeding.  No prior history  of intracranial or internal bleeding.  Most recent hemoglobin from February 2021 reviewed  Non-insulin-dependent diabetes mellitus type 2: Currently managed by primary team.  Obesity, due to excess calories: Body mass index is 34.59 kg/m. . I reviewed with the patient the importance of diet, regular physical activity/exercise, weight loss.   . Patient is educated on increasing physical activity gradually as tolerated.  With the goal of moderate intensity exercise for 30 minutes a day 5 days a week.  FINAL MEDICATION LIST END OF ENCOUNTER: Meds ordered this encounter  Medications  . isosorbide-hydrALAZINE (BIDIL) 20-37.5 MG tablet    Sig: Take 1 tablet by mouth 3 (three) times daily.    Dispense:  90 tablet    Refill:  0  . metoprolol succinate (TOPROL-XL) 100 MG 24 hr tablet    Sig: Take 1 tablet (100 mg total) by mouth in the morning. Take with or immediately following a meal. Hold if systolic blood pressure (top blood pressure number) less than 100 mmHg or heart rate less than 60 bpm (pulse).    Dispense:  90 tablet    Refill:  1     Current Outpatient Medications:  .  acetaminophen (TYLENOL) 500 MG tablet, Take 750 mg by mouth every 6 (six) hours as needed for moderate pain or headache. Reported on 05/31/2016, Disp: , Rfl:  .  apixaban (ELIQUIS) 5 MG TABS tablet, Take 1 tablet (5 mg total) by mouth 2 (two) times daily., Disp: 60 tablet, Rfl: 0 .  Blood Glucose Monitoring Suppl (ACCU-CHEK AVIVA PLUS) w/Device KIT, USE AS DIRECTED, Disp: 1 kit, Rfl: 0 .   Blood Pressure Monitoring (B-D ASSURE BPM/AUTO WRIST CUFF) MISC, Use to measure home blood pressure., Disp: 1 each, Rfl: 0 .  cloNIDine (CATAPRES) 0.2 MG tablet, TAKE 1 TABLET EVERY DAY (Patient taking differently: Take 0.2 mg by mouth 2 (two) times daily. ), Disp: 90 tablet, Rfl: 3 .  clotrimazole-betamethasone (LOTRISONE) cream, Apply 1 application topically as needed for rash., Disp: , Rfl:  .  gabapentin (NEURONTIN) 300 MG capsule, Take 300 mg by mouth in the morning and at bedtime., Disp: , Rfl:  .  glucose blood test strip, Use to test blood sugar once daily. E11.9, Disp: 100 each, Rfl: 3 .  hydrocortisone (PROCTO-PAK) 1 % CREA, Apply as directed to symptomatic hemorrhoids (Patient taking differently: Apply 1 application topically daily as needed (symptomatic hemorrhoids). ), Disp: 28.35 g, Rfl: 3 .  Ketotifen Fumarate (ALAWAY OP), Place 1 drop into both eyes 2 (two) times daily., Disp: , Rfl:  .  Lancet Device MISC, Use for home glucose monitoring, Disp: 1 each, Rfl: 3 .  Lancets (ONETOUCH ULTRASOFT) lancets, Use to test blood sugar once daily. E11.9, Disp: 100 each, Rfl: 3 .  Lancets Misc. (ACCU-CHEK SOFTCLIX LANCET DEV) KIT, USE FOR HOME GLUCOSE MONITORING, Disp: 1 kit, Rfl: 0 .  metFORMIN (GLUCOPHAGE) 1000 MG tablet, Take 1 tablet (1,000 mg total) by mouth 2 (two) times daily., Disp: 60 tablet, Rfl: 0 .  Multiple Vitamins-Minerals (MULTIVITAMIN WITH MINERALS) tablet, Take 1 tablet by mouth daily. Centrum Silver, Disp: , Rfl:  .  potassium chloride (KLOR-CON) 10 MEQ tablet, Take 10 mEq by mouth daily., Disp: , Rfl:  .  traMADol (ULTRAM) 50 MG tablet, Take 1 tablet by mouth as needed., Disp: , Rfl:  .  triamterene-hydrochlorothiazide (DYAZIDE) 37.5-25 MG capsule, TAKE 1 CAPSULE EVERY DAY, Disp: 90 capsule, Rfl: 3 .  verapamil (CALAN-SR) 240 MG CR tablet, TAKE 1 TABLET TWICE DAILY,  Disp: 180 tablet, Rfl: 3 .  isosorbide-hydrALAZINE (BIDIL) 20-37.5 MG tablet, Take 1 tablet by mouth 3 (three)  times daily., Disp: 90 tablet, Rfl: 0 .  metoprolol succinate (TOPROL-XL) 100 MG 24 hr tablet, Take 1 tablet (100 mg total) by mouth in the morning. Take with or immediately following a meal. Hold if systolic blood pressure (top blood pressure number) less than 100 mmHg or heart rate less than 60 bpm (pulse)., Disp: 90 tablet, Rfl: 1 .  valsartan (DIOVAN) 80 MG tablet, Take 80 mg by mouth daily., Disp: , Rfl:   Orders Placed This Encounter  Procedures  . Basic metabolic panel  . Magnesium  . Pro b natriuretic peptide (BNP)  . TSH  . PCV RENAL/RENAL ARTERY DUPLEX COMPLETE    Patient Instructions  Week 1: Clonidine 0.2 mg p.o. daily Week 2: Clonidine 0.2 mg p.o. q. other day   --Continue cardiac medications as reconciled in final medication list. --Return in about 3 weeks (around 05/31/2020) for BP follow up. EKG on admission. Or sooner if needed. --Continue follow-up with your primary care physician regarding the management of your other chronic comorbid conditions.  Patient's questions and concerns were addressed to her satisfaction. She voices understanding of the instructions provided during this encounter.   This note was created using a voice recognition software as a result there may be grammatical errors inadvertently enclosed that do not reflect the nature of this encounter. Every attempt is made to correct such errors.  Rex Kras, [place], [facility]  Pager: [phone] Office: [phone]

## 2020-05-15 ENCOUNTER — Other Ambulatory Visit: Payer: Self-pay | Admitting: Pharmacist

## 2020-05-15 ENCOUNTER — Telehealth: Payer: Self-pay

## 2020-05-15 DIAGNOSIS — I10 Essential (primary) hypertension: Secondary | ICD-10-CM

## 2020-05-15 NOTE — Telephone Encounter (Signed)
Called pt to review BP reading and recent lab results. Renal functions improved since last check. TSH levels elevated. Requested pt to follow up with her PCP for thyroid management. Pt stated that she has an appt w/ her PCP scheduled for next week.   Reviewed serum magnesium levels with pt. Pended Mag Ox 400 mg TID for Dr. Odis Hollingshead. Reviewed new medication and administration directions with patient.   Pt currently on clonidine 0.2 mg BID and was planning on starting 0.2 mg daily starting today. Pt called last Friday complaining of tachycardia. Pt also noted that she had c/o of HA Friday evening. Pt believes that her symptoms were associated with new start BiDil. Pt stopped taking her BiDil since last Friday. Recent clonidine dose decrease maybe playing a role in pt symptoms. Pt previously on clonidine 0.2 mg TID and was titrated to clonidine 0.2 mg BID starting last Monday. C/o of tachycardia, headaches, agitation, nervousness maybe associated with clonidine withdrawal symptoms. Reviewed potential withdrawal symptoms with pt and reviewed need for slower titration regimen. Will have pt continue clonidine 0.2 mg BID for another week. May consider 2 week titration schedule of 0.2 BID, then 0.1 mg BID, then 0.1 mg daily, then 0.1 mg QOD for 2 weeks each, before titrating off complelety. Symptoms have improved since last Friday.   BP readings this morning elevated at 184/97. Pt willing to retry BiDil while continuing current clonidine titration directions to see if the symptoms re-occur. May also consider increasing metoprolol dose to 200 mg daily in setting of new onset atrial flutter. Will continue to monitor BP readings and follow up as closely.

## 2020-05-17 MED ORDER — MAGNESIUM OXIDE 400 MG PO CAPS: 1.0000 | ORAL_CAPSULE | Freq: Three times a day (TID) | ORAL | 2 refills | Status: DC

## 2020-05-22 ENCOUNTER — Other Ambulatory Visit: Payer: Self-pay | Admitting: Cardiology

## 2020-05-24 ENCOUNTER — Other Ambulatory Visit: Payer: Self-pay | Admitting: Cardiology

## 2020-05-24 ENCOUNTER — Other Ambulatory Visit: Payer: Medicare HMO

## 2020-05-24 DIAGNOSIS — I4892 Unspecified atrial flutter: Secondary | ICD-10-CM

## 2020-05-24 DIAGNOSIS — Z7901 Long term (current) use of anticoagulants: Secondary | ICD-10-CM

## 2020-05-31 ENCOUNTER — Other Ambulatory Visit: Payer: Self-pay

## 2020-05-31 ENCOUNTER — Ambulatory Visit: Payer: Medicare HMO | Admitting: Cardiology

## 2020-05-31 ENCOUNTER — Ambulatory Visit: Payer: Medicare HMO

## 2020-05-31 DIAGNOSIS — I10 Essential (primary) hypertension: Secondary | ICD-10-CM

## 2020-06-14 ENCOUNTER — Telehealth: Payer: Self-pay

## 2020-06-14 ENCOUNTER — Ambulatory Visit: Payer: Medicare HMO | Admitting: Cardiology

## 2020-06-14 ENCOUNTER — Encounter: Payer: Self-pay | Admitting: Cardiology

## 2020-06-14 ENCOUNTER — Other Ambulatory Visit: Payer: Self-pay

## 2020-06-14 VITALS — BP 170/90 | HR 95 | Resp 16 | Ht 71.0 in | Wt 246.8 lb

## 2020-06-14 DIAGNOSIS — Z6834 Body mass index (BMI) 34.0-34.9, adult: Secondary | ICD-10-CM

## 2020-06-14 DIAGNOSIS — I10 Essential (primary) hypertension: Secondary | ICD-10-CM

## 2020-06-14 DIAGNOSIS — Z7901 Long term (current) use of anticoagulants: Secondary | ICD-10-CM

## 2020-06-14 DIAGNOSIS — I4892 Unspecified atrial flutter: Secondary | ICD-10-CM

## 2020-06-14 DIAGNOSIS — N281 Cyst of kidney, acquired: Secondary | ICD-10-CM

## 2020-06-14 DIAGNOSIS — E119 Type 2 diabetes mellitus without complications: Secondary | ICD-10-CM

## 2020-06-14 MED ORDER — CLONIDINE HCL 0.1 MG PO TABS: 0.1000 mg | ORAL_TABLET | Freq: Two times a day (BID) | ORAL | 11 refills | Status: DC

## 2020-06-14 NOTE — Progress Notes (Signed)
Frances Cabrera Date of Birth: 12-06-1946 MRN: [id number] Primary Care Provider:Boyd, Dola Factor, MD Primary Cardiologist: Rex Kras, DO, [facility]  (established care 04/26/2020)  Date: 06/14/20 Last Office Visit: 05/10/2020  Chief Complaint  Patient presents with   Follow-up    3 week   Atrial Flutter    HPI  Frances Cabrera is a 74 y.o. female who presents to the office with a  chief complaint of " hypertension and atrial flutter follow-up."  Her past medical history and cardiovascular risk factors are: Hypertensive urgency, non-insulin-dependent diabetes mellitus type 2, obesity, paroxysmal atrial flutter, obesity due to excess calories, postmenopausal female, advanced age.  Patient was originally referred to the office for evaluation and management of hypertension.  However, during the first office visit patient was found to be in atrial flutter.  At that time we increased AV nodal blocking agents and started her on oral anticoagulation given her thromboembolic prophylaxis risk as per CHA2DS2-VASc SCORE.  Patient has done well both on AV nodal blocking agents and oral anticoagulation.  She does not endorse any evidence of bleeding.  While she was weaning down on clonidine she did have episodes of palpitations and therefore she stopped the weaning process.  Benign essential hypertension: Patient was enrolled into ambulatory blood pressure monitoring and her home blood pressures have been improving but currently not at goal.  As noted above patient continues to be on clonidine and stopped the weaning process as she was having symptoms of palpitations.  In addition, at the last office visit she was recommended to start BiDil which she has not started yet either.  Medications reconciled.  Patient tries to consume a low-salt diet.  No family history of premature coronary disease or sudden cardiac death.  Denies prior history of coronary artery disease, myocardial infarction,  congestive heart failure, deep venous thrombosis, pulmonary embolism, stroke, transient ischemic attack.  FUNCTIONAL STATUS: Patient walks a mile every other day and approximately 1 hour to do so.  ALLERGIES: Allergies  Allergen Reactions   Penicillins Hives    Has patient had a PCN reaction causing immediate rash, facial/tongue/throat swelling, SOB or lightheadedness with hypotension: Yes Has patient had a PCN reaction causing severe rash involving mucus membranes or skin necrosis: Yes Has patient had a PCN reaction that required hospitalization No Has patient had a PCN reaction occurring within the last 10 years: No If all of the above answers are "NO", then may proceed with Cephalosporin use.    Iodinated Diagnostic Agents Rash   Shrimp [Shellfish Allergy] Rash    MEDICATION LIST PRIOR TO VISIT: Current Meds  Medication Sig   acetaminophen (TYLENOL) 500 MG tablet Take 750 mg by mouth every 6 (six) hours as needed for moderate pain or headache. Reported on 05/31/2016   Blood Glucose Monitoring Suppl (ACCU-CHEK AVIVA PLUS) w/Device KIT USE AS DIRECTED   Blood Pressure Monitoring (B-D ASSURE BPM/AUTO WRIST CUFF) MISC Use to measure home blood pressure.   ELIQUIS 5 MG TABS tablet Take 1 tablet by mouth twice daily   gabapentin (NEURONTIN) 300 MG capsule Take 300 mg by mouth in the morning and at bedtime.   glucose blood test strip Use to test blood sugar once daily. E11.9   Ketotifen Fumarate (ALAWAY OP) Place 1 drop into both eyes 2 (two) times daily.   Lancet Device MISC Use for home glucose monitoring   Lancets (ONETOUCH ULTRASOFT) lancets Use to test blood sugar once daily. E11.9   Lancets Misc. (ACCU-CHEK SOFTCLIX LANCET DEV) KIT  USE FOR HOME GLUCOSE MONITORING   Magnesium Oxide 400 MG CAPS Take 1 capsule (400 mg total) by mouth 3 (three) times daily.   metFORMIN (GLUCOPHAGE) 1000 MG tablet Take 1 tablet (1,000 mg total) by mouth 2 (two) times daily. (Patient taking  differently: Take 500 mg by mouth 2 (two) times daily. )   metoprolol succinate (TOPROL-XL) 100 MG 24 hr tablet Take 1 tablet (100 mg total) by mouth in the morning. Take with or immediately following a meal. Hold if systolic blood pressure (top blood pressure number) less than 100 mmHg or heart rate less than 60 bpm (pulse).   Multiple Vitamins-Minerals (MULTIVITAMIN WITH MINERALS) tablet Take 1 tablet by mouth daily. Centrum Silver   potassium chloride (KLOR-CON) 10 MEQ tablet Take 10 mEq by mouth daily.   traMADol (ULTRAM) 50 MG tablet Take 1 tablet by mouth as needed.   triamterene-hydrochlorothiazide (DYAZIDE) 37.5-25 MG capsule TAKE 1 CAPSULE EVERY DAY   verapamil (CALAN-SR) 240 MG CR tablet TAKE 1 TABLET TWICE DAILY   [DISCONTINUED] cloNIDine (CATAPRES) 0.2 MG tablet TAKE 1 TABLET EVERY DAY (Patient taking differently: Take 0.2 mg by mouth 2 (two) times daily. )     PAST MEDICAL HISTORY: Past Medical History:  Diagnosis Date   Diabetes mellitus    Elevated LFTs    Hypertension    Menopause    Microalbuminuria    Obesity    Paroxysmal atrial flutter (HCC)    Recurrent UTI    post-coital    PAST SURGICAL HISTORY: Past Surgical History:  Procedure Laterality Date   CHOLECYSTECTOMY  2000   TUBAL LIGATION      FAMILY HISTORY: The patient family history includes Diabetes in her brother, brother, father, and mother; Hypertension in her mother; Kidney failure in her mother.  SOCIAL HISTORY:  The patient  reports that she has never smoked. She has never used smokeless tobacco. She reports that she does not drink alcohol and does not use drugs.  REVIEW OF SYSTEMS: Review of Systems  Constitutional: Negative for chills and fever.  HENT: Negative for hoarse voice and nosebleeds.   Eyes: Negative for discharge, double vision and pain.  Cardiovascular: Negative for chest pain, claudication, dyspnea on exertion, leg swelling, near-syncope, orthopnea, palpitations,  paroxysmal nocturnal dyspnea and syncope.  Respiratory: Negative for hemoptysis and shortness of breath.   Musculoskeletal: Negative for muscle cramps and myalgias.  Gastrointestinal: Negative for abdominal pain, constipation, diarrhea, hematemesis, hematochezia, melena, nausea and vomiting.  Neurological: Negative for dizziness and light-headedness.    PHYSICAL EXAM: Vitals with BMI 06/14/2020 05/10/2020 05/10/2020  Height '5\' 11"'$  - '5\' 11"'$   Weight 246 lbs 13 oz - 248 lbs  BMI 44.31 - 54.0  Systolic 086 761 950  Diastolic 90 80 90  Pulse 95 99 100   CONSTITUTIONAL: Well-developed and well-nourished. No acute distress.  SKIN: Skin is warm and dry. No rash noted. No cyanosis. No pallor. No jaundice HEAD: Normocephalic and atraumatic.  EYES: No scleral icterus MOUTH/THROAT: Moist oral membranes.  NECK: No JVD present. No thyromegaly noted. No carotid bruits  LYMPHATIC: No visible cervical adenopathy.  CHEST Normal respiratory effort. No intercostal retractions  LUNGS: Clear to auscultation bilaterally.  No stridor. No wheezes. No rales.  CARDIOVASCULAR:  regular, positive S1-S2, no murmurs rubs or gallops appreciated. ABDOMINAL: Obese, soft, nontender, nondistended, positive bowel sounds in all 4 quadrants no apparent ascites.  EXTREMITIES: No peripheral edema  HEMATOLOGIC: No significant bruising NEUROLOGIC: Oriented to person, place, and time. Nonfocal. Normal muscle  tone.  PSYCHIATRIC: Normal mood and affect. Normal behavior. Cooperative  CARDIAC DATABASE: EKG: 04/26/2020 11:14 AM: Typical atrial flutter 2-1, 136 bpm, right bundle branch block.  06/14/2020: Normal sinus rhythm, 93 bpm, right bundle branch block, poor R wave progression, left atrial enlargement, no underlying injury pattern.  Echocardiogram: 05/10/2020: LVEF greater than 70%, hyperdynamic systolic function, mild LVH, indeterminate diastolic filling pattern, left atrial size normal, mild MR, mild TR, no pulmonary  hypertension.  Stress Testing: None  Heart Catheterization: None  Vascular imaging: Lower Extremity Arterial Duplex 04/05/2020:  No hemodynamically significant stenoses are identified in the lower extremity arterial system. This exam reveals normal perfusion of the bilateral lower extremity (ABI 1.00).    Ultrasound venous duplex: Lower Venous Study 08/31/2019: Left: There is no evidence of deep vein thrombosis in the lower extremity. However, portions of this examination were limited- see technologist comments above. No cystic structure found in the popliteal fossa.   Renal artery duplex 05/31/2020:  The renal artery flow is not well documented in the study due to difficulty in visualization of the renal artery ostium.  Renal length is within normal limits for both kidneys.  Normal abdominal aorta flow velocities noted.  Consider different modality study (MRA without contrast for RAS).  Simple cyst measuring 3x3cm in the left upper pole.  LABORATORY DATA: CBC Latest Ref Rng & Units 12/08/2019 08/31/2019 02/25/2019  WBC 4.0 - 10.5 K/uL 5.6 6.3 6.3  Hemoglobin 12.0 - 15.0 g/dL 12.2 12.6 12.0  Hematocrit 36 - 46 % 38.1 40.8 39.4  Platelets 150 - 400 K/uL 322 343 305    CMP Latest Ref Rng & Units 05/10/2020 12/08/2019 08/31/2019  Glucose 65 - 99 mg/dL 143(H) 159(H) 251(H)  BUN 8 - 27 mg/dL _0 Creatinine 0.57 - 1.00 mg/dL 1.02(H) 1.29(H) 0.85  Sodium 134 - 144 mmol/L 138 137 136  Potassium 3.5 - 5.2 mmol/L 3.7 3.4(L) 3.5  Chloride 96 - 106 mmol/L 98 98 100  CO2 20 - 29 mmol/L 22 20(L) 20(L)  Calcium 8.7 - 10.3 mg/dL 10.4(H) 9.5 9.8  Total Protein 6.5 - 8.1 g/dL - 8.5(H) 8.9(H)  Total Bilirubin 0.3 - 1.2 mg/dL - 1.1 0.7  Alkaline Phos 38 - 126 U/L - 43 56  AST 15 - 41 U/L - 76(H) 70(H)  ALT 0 - 44 U/L - 65(H) 66(H)    Lipid Panel     Component Value Date/Time   CHOL 187 11/18/2016 1735   TRIG 142 11/18/2016 1735   HDL 56 11/18/2016 1735   CHOLHDL 3.3 11/18/2016  1735   CHOLHDL 4.1 09/05/2015 1609   VLDL 31 (H) 09/05/2015 1609   LDLCALC 103 (H) 11/18/2016 1735   LABVLDL 28 11/18/2016 1735    Lab Results  Component Value Date   HGBA1C 7.0 (H) 11/18/2016   HGBA1C 8.1 (H) 05/31/2016   HGBA1C 8.1 11/28/2015   No components found for: NTPROBNP Lab Results  Component Value Date   TSH 6.910 (H) 05/10/2020   TSH 3.222 11/05/2016   TSH 3.10 05/31/2016   HEMOGLOBIN A1C Lab Results  Component Value Date   HGBA1C 7.0 (H) 11/18/2016   MPG 186 05/31/2016   External Labs: Collected: [facility] 01/26/2020 Creatinine 0.95 mg/dL. eGFR: 70 mL/min per 1.73 m Lipid profile: Total cholesterol 177, triglycerides 193, HDL 51, LDL 87, non-HDL 126 WBC 10.2, RBC 4.52, Hb 11.1, Hct 33.8, MCV 74.8, Platelets387.  Hemoglobin A1c 7.7  Collected: 04/26/2020 Potassium 3.8. Creatinine 0.98. GFR  67. AST 41, ALT 42  IMPRESSION:    ICD-10-CM   1. Paroxysmal atrial flutter (HCC)  I48.92 EKG 12-Lead  2. Long term (current) use of anticoagulants  Z79.01   3. Non-insulin dependent type 2 diabetes mellitus ([place])  E11.9   4. Class 1 obesity due to excess calories with serious comorbidity and body mass index (BMI) of 34.0 to 34.9 in adult  E66.09    Z68.34   5. Benign hypertension  I10 cloNIDine (CATAPRES) 0.1 MG tablet    DISCONTINUED: cloNIDine (CATAPRES) 0.1 MG tablet  6. Renal cyst  N28.1      RECOMMENDATIONS: Chastidy Ranker is a 74 y.o. female whose past medical history and cardiac risk factors include: Hypertensive urgency, non-insulin-dependent diabetes mellitus type 2, obesity, paroxysmal atrial flutter newly diagnosed, obesity due to excess calories, postmenopausal female, advanced age.  Benign essential hypertension: Improving  Patient's office blood pressure is not well controlled.  But home blood pressures are improving.  Educated on the importance of a low-salt diet.  Patient is currently on clonidine 0.2 mg p.o.  daily.  Transition to clonidine 0.63m p.o. twice daily and wean over the next several weeks.   Patient is encouraged to start BiDil.  Patient did have a renal duplex to evaluate for renal artery stenosis.  However the study was limited in visualization of the renal artery.  She was also found to have an incidental renal cyst measuring 3 x 3 cm in the left upper pole.  Patient is asked to discuss the findings of the cyst with her primary care physician and continue the work-up as recommended as per PCP.  Echocardiogram results reviewed with the patient at today's office visit as well.  Patient is enrolled into principal care management for ambulatory blood pressure monitoring.  We will continue to monitor her blood pressures.  Paroxysmal atrial flutter:  Rate control: Currently on verapamil.  Uptitrate Toprol-XL.  Rhythm control: N/A.  Thromboembolic prophylaxis: Eliquis 5 mg p.o. twice daily  CHA2DS2-VASc SCORE is 4 which correlates to 4% risk of stroke per year.  Reemphasized the risks, benefits, and alternatives to oral anticoagulation.  Patient verbalized understanding.  Does not endorse any evidence of bleeding.    Patient will be scheduled for a stress test once her blood pressure is better controlled and ventricular rate improved  Patient's most recent TSH level of 6.9, patient is asked to discuss this further with her PCP as well.   Long-term oral anticoagulation:  Indication paroxysmal atrial flutter.  Reviewed the risks, benefits, and alternatives to oral anticoagulation at today's visit.  Patient does not endorse any evidence of bleeding.  No prior history of intracranial or internal bleeding.  Most recent hemoglobin from February 2021 reviewed  Non-insulin-dependent diabetes mellitus type 2: Currently managed by primary team.  Obesity, due to excess calories: Body mass index is 34.42 kg/m.  I reviewed with the patient the importance of diet, regular physical  activity/exercise, weight loss.    Patient is educated on increasing physical activity gradually as tolerated.  With the goal of moderate intensity exercise for 30 minutes a day 5 days a week.  FINAL MEDICATION LIST END OF ENCOUNTER:  Current Outpatient Medications:    acetaminophen (TYLENOL) 500 MG tablet, Take 750 mg by mouth every 6 (six) hours as needed for moderate pain or headache. Reported on 05/31/2016, Disp: , Rfl:    Blood Glucose Monitoring Suppl (ACCU-CHEK AVIVA PLUS) w/Device KIT, USE AS DIRECTED, Disp: 1 kit, Rfl: 0  Blood Pressure Monitoring (B-D ASSURE BPM/AUTO WRIST CUFF) MISC, Use to measure home blood pressure., Disp: 1 each, Rfl: 0   ELIQUIS 5 MG TABS tablet, Take 1 tablet by mouth twice daily, Disp: 60 tablet, Rfl: 0   gabapentin (NEURONTIN) 300 MG capsule, Take 300 mg by mouth in the morning and at bedtime., Disp: , Rfl:    glucose blood test strip, Use to test blood sugar once daily. E11.9, Disp: 100 each, Rfl: 3   Ketotifen Fumarate (ALAWAY OP), Place 1 drop into both eyes 2 (two) times daily., Disp: , Rfl:    Lancet Device MISC, Use for home glucose monitoring, Disp: 1 each, Rfl: 3   Lancets (ONETOUCH ULTRASOFT) lancets, Use to test blood sugar once daily. E11.9, Disp: 100 each, Rfl: 3   Lancets Misc. (ACCU-CHEK SOFTCLIX LANCET DEV) KIT, USE FOR HOME GLUCOSE MONITORING, Disp: 1 kit, Rfl: 0   Magnesium Oxide 400 MG CAPS, Take 1 capsule (400 mg total) by mouth 3 (three) times daily., Disp: 90 capsule, Rfl: 2   metFORMIN (GLUCOPHAGE) 1000 MG tablet, Take 1 tablet (1,000 mg total) by mouth 2 (two) times daily. (Patient taking differently: Take 500 mg by mouth 2 (two) times daily. ), Disp: 60 tablet, Rfl: 0   metoprolol succinate (TOPROL-XL) 100 MG 24 hr tablet, Take 1 tablet (100 mg total) by mouth in the morning. Take with or immediately following a meal. Hold if systolic blood pressure (top blood pressure number) less than 100 mmHg or heart rate less than 60 bpm  (pulse)., Disp: 90 tablet, Rfl: 1   Multiple Vitamins-Minerals (MULTIVITAMIN WITH MINERALS) tablet, Take 1 tablet by mouth daily. Centrum Silver, Disp: , Rfl:    potassium chloride (KLOR-CON) 10 MEQ tablet, Take 10 mEq by mouth daily., Disp: , Rfl:    traMADol (ULTRAM) 50 MG tablet, Take 1 tablet by mouth as needed., Disp: , Rfl:    triamterene-hydrochlorothiazide (DYAZIDE) 37.5-25 MG capsule, TAKE 1 CAPSULE EVERY DAY, Disp: 90 capsule, Rfl: 3   verapamil (CALAN-SR) 240 MG CR tablet, TAKE 1 TABLET TWICE DAILY, Disp: 180 tablet, Rfl: 3   cloNIDine (CATAPRES) 0.1 MG tablet, Take 1 tablet (0.1 mg total) by mouth 2 (two) times daily., Disp: 60 tablet, Rfl: 11  Orders Placed This Encounter  Procedures   EKG 12-Lead    There are no Patient Instructions on file for this visit.  --Continue cardiac medications as reconciled in final medication list. --Return in about 6 weeks (around 07/26/2020) for BP follow up.. Or sooner if needed. --Continue follow-up with your primary care physician regarding the management of your other chronic comorbid conditions.  Total time spent: 35 minutes.  Patient's questions and concerns were addressed to her satisfaction. She voices understanding of the instructions provided during this encounter.   This note was created using a voice recognition software as a result there may be grammatical errors inadvertently enclosed that do not reflect the nature of this encounter. Every attempt is made to correct such errors.  Rex Kras, [place], [facility]  Pager: [phone] Office: [phone]

## 2020-06-14 NOTE — Telephone Encounter (Signed)
Patient had 11 bottles, that she brought in to confirm medications, 11 bottles all left on counter, Teny came in after, all bottle confimed by [facility].

## 2020-06-15 NOTE — Telephone Encounter (Signed)
Reviewed medication at last office visit.

## 2020-06-21 ENCOUNTER — Other Ambulatory Visit: Payer: Self-pay

## 2020-06-21 DIAGNOSIS — I10 Essential (primary) hypertension: Secondary | ICD-10-CM

## 2020-06-21 MED ORDER — MAGNESIUM OXIDE 400 MG PO CAPS: 1.0000 | ORAL_CAPSULE | Freq: Three times a day (TID) | ORAL | 3 refills | Status: DC

## 2020-07-04 ENCOUNTER — Other Ambulatory Visit: Payer: Self-pay

## 2020-07-04 DIAGNOSIS — I10 Essential (primary) hypertension: Secondary | ICD-10-CM

## 2020-07-04 MED ORDER — METOPROLOL SUCCINATE ER 100 MG PO TB24: 100.0000 mg | ORAL_TABLET | Freq: Every morning | ORAL | 3 refills | Status: DC

## 2020-07-06 ENCOUNTER — Telehealth: Payer: Self-pay | Admitting: Pharmacist

## 2020-07-06 NOTE — Telephone Encounter (Addendum)
Med list reviewed and updated. Clonidine dose titration. Dose decreased to 0.1 mg nightly. Will continue titrating pt off every 3 weeks. Pt denies any complains of palpitations or CP.

## 2020-08-03 ENCOUNTER — Ambulatory Visit: Payer: Medicare HMO | Admitting: Cardiology

## 2020-08-28 ENCOUNTER — Other Ambulatory Visit: Payer: Self-pay

## 2020-08-28 DIAGNOSIS — I10 Essential (primary) hypertension: Secondary | ICD-10-CM

## 2020-08-28 MED ORDER — METOPROLOL SUCCINATE ER 100 MG PO TB24: 100.0000 mg | ORAL_TABLET | Freq: Every morning | ORAL | 3 refills | Status: DC

## 2020-09-13 ENCOUNTER — Other Ambulatory Visit: Payer: Self-pay

## 2020-09-13 ENCOUNTER — Encounter: Payer: Self-pay | Admitting: Cardiology

## 2020-09-13 ENCOUNTER — Ambulatory Visit: Payer: Medicare HMO | Admitting: Cardiology

## 2020-09-13 VITALS — BP 168/95 | HR 95 | Ht 71.0 in | Wt 259.0 lb

## 2020-09-13 DIAGNOSIS — I4892 Unspecified atrial flutter: Secondary | ICD-10-CM

## 2020-09-13 DIAGNOSIS — I10 Essential (primary) hypertension: Secondary | ICD-10-CM

## 2020-09-13 DIAGNOSIS — E119 Type 2 diabetes mellitus without complications: Secondary | ICD-10-CM

## 2020-09-13 DIAGNOSIS — Z7901 Long term (current) use of anticoagulants: Secondary | ICD-10-CM

## 2020-09-13 MED ORDER — BIDIL 20-37.5 MG PO TABS: 1.0000 | ORAL_TABLET | Freq: Three times a day (TID) | ORAL | 2 refills | Status: DC

## 2020-09-13 NOTE — Progress Notes (Signed)
Frances Cabrera Date of Birth: 06-06-1946 MRN: [id number] Primary Care Provider:Boyd, Dola Factor, MD Primary Cardiologist: Rex Kras, DO, [facility]  (established care 04/26/2020)  Date: 09/13/20 Last Office Visit: 06/14/2020  Chief Complaint  Patient presents with  . Hypertension  . Atrial Flutter    HPI  Frances Cabrera is a 74 y.o. female who presents to the office with a  chief complaint of " hypertension and atrial flutter follow-up."  Her past medical history and cardiovascular risk factors are: Hypertensive urgency, non-insulin-dependent diabetes mellitus type 2, obesity, paroxysmal atrial flutter, obesity due to excess calories, postmenopausal female, advanced age.  Benign essential hypertension: Patient was enrolled into ambulatory blood pressure monitoring and her home blood pressures have been improving but currently not at goal.  Patient's 2-week average blood pressure readings is 142/57 millimeters of mercury and heart rate of 80 bpm.  She has successfully weaned off of clonidine.  Unfortunately, patient states that she dropped her vital medications in the bathroom and therefore has not been on the medication over the last 3 weeks at least.  Patient asking for refill.  Medications reconciled.  Patient tries to consume a low-salt diet.  Atrial flutter: Patient is currently on AV nodal blocking agents and tolerating the medication well without any side effects or intolerances.  Patient is also on Eliquis for thromboembolic prophylaxis and does not endorse any evidence of bleeding.  Reiterated the risks, benefits, and alternatives to oral anticoagulation.  Patient verbalized understanding.  No family history of premature coronary disease or sudden cardiac death.  Denies prior history of coronary artery disease, myocardial infarction, congestive heart failure, deep venous thrombosis, pulmonary embolism, stroke, transient ischemic attack.  FUNCTIONAL STATUS: Patient  walks a mile every other day and approximately 1 hour to do so.  ALLERGIES: Allergies  Allergen Reactions  . Penicillins Hives    Has patient had a PCN reaction causing immediate rash, facial/tongue/throat swelling, SOB or lightheadedness with hypotension: Yes Has patient had a PCN reaction causing severe rash involving mucus membranes or skin necrosis: Yes Has patient had a PCN reaction that required hospitalization No Has patient had a PCN reaction occurring within the last 10 years: No If all of the above answers are "NO", then may proceed with Cephalosporin use.   . Iodinated Diagnostic Agents Rash  . Shrimp [Shellfish Allergy] Rash    MEDICATION LIST PRIOR TO VISIT: Current Meds  Medication Sig  . acetaminophen (TYLENOL) 500 MG tablet Take 750 mg by mouth every 6 (six) hours as needed for moderate pain or headache. Reported on 05/31/2016  . Blood Glucose Monitoring Suppl (ACCU-CHEK AVIVA PLUS) w/Device KIT USE AS DIRECTED  . Blood Pressure Monitoring (B-D ASSURE BPM/AUTO WRIST CUFF) MISC Use to measure home blood pressure.  . clotrimazole-betamethasone (LOTRISONE) cream Apply 1 application topically as needed.  Marland Kitchen ELIQUIS 5 MG TABS tablet Take 1 tablet by mouth twice daily  . EUTHYROX 25 MCG tablet Take 25 mcg by mouth every morning.  . gabapentin (NEURONTIN) 300 MG capsule Take 300 mg by mouth in the morning and at bedtime.  Marland Kitchen glucose blood test strip Use to test blood sugar once daily. E11.9  . Ketotifen Fumarate (ALAWAY OP) Place 1 drop into both eyes 2 (two) times daily.  Elmore Guise Device MISC Use for home glucose monitoring  . Magnesium Oxide 400 MG CAPS Take 1 capsule (400 mg total) by mouth 3 (three) times daily.  . metFORMIN (GLUCOPHAGE) 1000 MG tablet Take 1 tablet (1,000 mg total) by  mouth 2 (two) times daily. (Patient taking differently: Take 500 mg by mouth 2 (two) times daily. )  . metoprolol succinate (TOPROL-XL) 100 MG 24 hr tablet Take 1 tablet (100 mg total) by mouth  in the morning. Take with or immediately following a meal. Hold if systolic blood pressure (top blood pressure number) less than 100 mmHg or heart rate less than 60 bpm (pulse).  . Multiple Vitamins-Minerals (MULTIVITAMIN WITH MINERALS) tablet Take 1 tablet by mouth daily. Centrum Silver  . potassium chloride (KLOR-CON) 10 MEQ tablet Take 10 mEq by mouth daily.  . traMADol (ULTRAM) 50 MG tablet Take 1 tablet by mouth as needed.  . triamterene-hydrochlorothiazide (DYAZIDE) 37.5-25 MG capsule TAKE 1 CAPSULE EVERY DAY (Patient taking differently: Take 1 capsule by mouth daily. Taking in the morning)  . verapamil (CALAN-SR) 240 MG CR tablet TAKE 1 TABLET TWICE DAILY     PAST MEDICAL HISTORY: Past Medical History:  Diagnosis Date  . Diabetes mellitus   . Elevated LFTs   . Hypertension   . Menopause   . Microalbuminuria   . Obesity   . Paroxysmal atrial flutter ([place])   . Recurrent UTI    post-coital    PAST SURGICAL HISTORY: Past Surgical History:  Procedure Laterality Date  . CHOLECYSTECTOMY  2000  . TUBAL LIGATION      FAMILY HISTORY: The patient family history includes Diabetes in her brother, brother, father, and mother; Hypertension in her mother; Kidney failure in her mother.  SOCIAL HISTORY:  The patient  reports that she has never smoked. She has never used smokeless tobacco. She reports that she does not drink alcohol and does not use drugs.  REVIEW OF SYSTEMS: Review of Systems  Constitutional: Positive for weight gain. Negative for chills and fever.  HENT: Negative for hoarse voice and nosebleeds.   Eyes: Negative for discharge, double vision and pain.  Cardiovascular: Negative for chest pain, claudication, dyspnea on exertion, leg swelling, near-syncope, orthopnea, palpitations, paroxysmal nocturnal dyspnea and syncope.  Respiratory: Negative for hemoptysis and shortness of breath.   Musculoskeletal: Negative for muscle cramps and myalgias.  Gastrointestinal: Negative  for abdominal pain, constipation, diarrhea, hematemesis, hematochezia, melena, nausea and vomiting.  Neurological: Negative for dizziness and light-headedness.    PHYSICAL EXAM: Vitals with BMI 09/13/2020 09/13/2020 06/14/2020  Height - 5' 11"  5' 11"   Weight - 259 lbs 246 lbs 13 oz  BMI - 67.34 19.37  Systolic 902 409 735  Diastolic 95 93 90  Pulse 95 117 95   CONSTITUTIONAL: Well-developed and well-nourished. No acute distress.  SKIN: Skin is warm and dry. No rash noted. No cyanosis. No pallor. No jaundice HEAD: Normocephalic and atraumatic.  EYES: No scleral icterus MOUTH/THROAT: Moist oral membranes.  NECK: No JVD present. No thyromegaly noted. No carotid bruits  LYMPHATIC: No visible cervical adenopathy.  CHEST Normal respiratory effort. No intercostal retractions  LUNGS: Clear to auscultation bilaterally.  No stridor. No wheezes. No rales.  CARDIOVASCULAR:  regular, positive S1-S2, no murmurs rubs or gallops appreciated. ABDOMINAL: Obese, soft, nontender, nondistended, positive bowel sounds in all 4 quadrants no apparent ascites.  EXTREMITIES: No peripheral edema  HEMATOLOGIC: No significant bruising NEUROLOGIC: Oriented to person, place, and time. Nonfocal. Normal muscle tone.  PSYCHIATRIC: Normal mood and affect. Normal behavior. Cooperative  CARDIAC DATABASE: EKG: 04/26/2020 11:14 AM: Typical atrial flutter 2-1, 136 bpm, right bundle branch block.  06/14/2020: Normal sinus rhythm, 93 bpm, right bundle branch block, poor R wave progression, left atrial enlargement, no underlying  injury pattern.  Echocardiogram: 05/10/2020: LVEF greater than 70%, hyperdynamic systolic function, mild LVH, indeterminate diastolic filling pattern, left atrial size normal, mild MR, mild TR, no pulmonary hypertension.  Stress Testing: None  Heart Catheterization: None  Vascular imaging: Lower Extremity Arterial Duplex 04/05/2020:  No hemodynamically significant stenoses are identified in the  lower extremity arterial system. This exam reveals normal perfusion of the bilateral lower extremity (ABI 1.00).    Ultrasound venous duplex: Lower Venous Study 08/31/2019: Left: There is no evidence of deep vein thrombosis in the lower extremity. However, portions of this examination were limited- see technologist comments above. No cystic structure found in the popliteal fossa.   Renal artery duplex 05/31/2020:  The renal artery flow is not well documented in the study due to difficulty in visualization of the renal artery ostium.  Renal length is within normal limits for both kidneys.  Normal abdominal aorta flow velocities noted.  Consider different modality study (MRA without contrast for RAS).  Simple cyst measuring 3x3cm in the left upper pole.  LABORATORY DATA: External Labs: Collected: [facility] 01/26/2020 Creatinine 0.95 mg/dL. eGFR: 70 mL/min per 1.73 m Lipid profile: Total cholesterol 177, triglycerides 193, HDL 51, LDL 87, non-HDL 126 WBC 10.2, RBC 4.52, Hb 11.1, Hct 33.8, MCV 74.8, Platelets387.  Hemoglobin A1c 7.7  Collected: 04/26/2020 Potassium 3.8. Creatinine 0.98. GFR 67. AST 41, ALT 42  IMPRESSION:    ICD-10-CM   1. Paroxysmal atrial flutter (HCC)  I48.92   2. Long term (current) use of anticoagulants  Z79.01   3. Non-insulin dependent type 2 diabetes mellitus ([place])  E11.9   4. Benign hypertension  I10 BIDIL 20-37.5 MG tablet  5. Class 2 severe obesity due to excess calories with serious comorbidity and body mass index (BMI) of 36.0 to 36.9 in adult [facility])  E66.01    Z68.36      RECOMMENDATIONS: Frances Cabrera is a 74 y.o. female whose past medical history and cardiac risk factors include: Hypertensive urgency, non-insulin-dependent diabetes mellitus type 2, obesity, paroxysmal atrial flutter newly diagnosed, obesity due to excess calories, postmenopausal female, advanced age.  Benign essential hypertension:  Improving  Patient's office blood pressure is not well controlled.  But home blood pressures are improving.  Educated on the importance of a low-salt diet.  Patient has successfully weaned off of clonidine.  Refill BiDil.  Patient is enrolled into principal care management for ambulatory blood pressure monitoring.  We will continue to monitor her blood pressures.  Paroxysmal atrial flutter:  Rate control: Currently on verapamil & Toprol-XL.  Rhythm control: N/A.  Thromboembolic prophylaxis: Eliquis 5 mg p.o. twice daily  CHA2DS2-VASc SCORE is 4 which correlates to 4% risk of stroke per year.  Reemphasized the risks, benefits, and alternatives to oral anticoagulation.  Patient verbalized understanding.  Does not endorse any evidence of bleeding.    Patient's most recent TSH level of 6.9, patient is asked to discuss this further with her PCP as well.   Long-term oral anticoagulation:  Indication paroxysmal atrial flutter.  Reviewed the risks, benefits, and alternatives to oral anticoagulation at today's visit.  Patient does not endorse any evidence of bleeding.  No prior history of intracranial or internal bleeding.  Recommend checking a CBC, BMP, magnesium level.  Patient states that she has an upcoming appointment with PCP later today and will have blood work done there.  Patient is asked to have these drawn as well and to provide [place] a copy.  Patient verbalized understanding.  Non-insulin-dependent diabetes mellitus type 2: Currently managed by primary team.  Obesity, due to excess calories: Body mass index is 36.12 kg/m. . I reviewed with the patient the importance of diet, regular physical activity/exercise, weight loss.   . Patient is educated on increasing physical activity gradually as tolerated.  With the goal of moderate intensity exercise for 30 minutes a day 5 days a week.  FINAL MEDICATION LIST END OF ENCOUNTER:  Current Outpatient Medications:  .   acetaminophen (TYLENOL) 500 MG tablet, Take 750 mg by mouth every 6 (six) hours as needed for moderate pain or headache. Reported on 05/31/2016, Disp: , Rfl:  .  Blood Glucose Monitoring Suppl (ACCU-CHEK AVIVA PLUS) w/Device KIT, USE AS DIRECTED, Disp: 1 kit, Rfl: 0 .  Blood Pressure Monitoring (B-D ASSURE BPM/AUTO WRIST CUFF) MISC, Use to measure home blood pressure., Disp: 1 each, Rfl: 0 .  clotrimazole-betamethasone (LOTRISONE) cream, Apply 1 application topically as needed., Disp: , Rfl:  .  ELIQUIS 5 MG TABS tablet, Take 1 tablet by mouth twice daily, Disp: 60 tablet, Rfl: 0 .  EUTHYROX 25 MCG tablet, Take 25 mcg by mouth every morning., Disp: , Rfl:  .  gabapentin (NEURONTIN) 300 MG capsule, Take 300 mg by mouth in the morning and at bedtime., Disp: , Rfl:  .  glucose blood test strip, Use to test blood sugar once daily. E11.9, Disp: 100 each, Rfl: 3 .  Ketotifen Fumarate (ALAWAY OP), Place 1 drop into both eyes 2 (two) times daily., Disp: , Rfl:  .  Lancet Device MISC, Use for home glucose monitoring, Disp: 1 each, Rfl: 3 .  Magnesium Oxide 400 MG CAPS, Take 1 capsule (400 mg total) by mouth 3 (three) times daily., Disp: 270 capsule, Rfl: 3 .  metFORMIN (GLUCOPHAGE) 1000 MG tablet, Take 1 tablet (1,000 mg total) by mouth 2 (two) times daily. (Patient taking differently: Take 500 mg by mouth 2 (two) times daily. ), Disp: 60 tablet, Rfl: 0 .  metoprolol succinate (TOPROL-XL) 100 MG 24 hr tablet, Take 1 tablet (100 mg total) by mouth in the morning. Take with or immediately following a meal. Hold if systolic blood pressure (top blood pressure number) less than 100 mmHg or heart rate less than 60 bpm (pulse)., Disp: 90 tablet, Rfl: 3 .  Multiple Vitamins-Minerals (MULTIVITAMIN WITH MINERALS) tablet, Take 1 tablet by mouth daily. Centrum Silver, Disp: , Rfl:  .  potassium chloride (KLOR-CON) 10 MEQ tablet, Take 10 mEq by mouth daily., Disp: , Rfl:  .  traMADol (ULTRAM) 50 MG tablet, Take 1 tablet by  mouth as needed., Disp: , Rfl:  .  triamterene-hydrochlorothiazide (DYAZIDE) 37.5-25 MG capsule, TAKE 1 CAPSULE EVERY DAY (Patient taking differently: Take 1 capsule by mouth daily. Taking in the morning), Disp: 90 capsule, Rfl: 3 .  verapamil (CALAN-SR) 240 MG CR tablet, TAKE 1 TABLET TWICE DAILY, Disp: 180 tablet, Rfl: 3 .  BIDIL 20-37.5 MG tablet, Take 1 tablet by mouth in the morning, at noon, and at bedtime., Disp: 90 tablet, Rfl: 2  No orders of the defined types were placed in this encounter.  --Continue cardiac medications as reconciled in final medication list. --Return for Reevaluation of, BP, Atrial flutter. Or sooner if needed. --Continue follow-up with your primary care physician regarding the management of your other chronic comorbid conditions.  Total time spent: 35 minutes.  Patient's questions and concerns were addressed to her satisfaction. She voices understanding of the instructions provided during this encounter.   This  note was created using a voice recognition software as a result there may be grammatical errors inadvertently enclosed that do not reflect the nature of this encounter. Every attempt is made to correct such errors.  Rex Kras, [place], [facility]  Pager: [phone] Office: [phone]

## 2020-09-21 ENCOUNTER — Other Ambulatory Visit: Payer: Self-pay | Admitting: Pharmacist

## 2020-09-21 DIAGNOSIS — I10 Essential (primary) hypertension: Secondary | ICD-10-CM

## 2020-09-21 MED ORDER — BIDIL 20-37.5 MG PO TABS: 1.0000 | ORAL_TABLET | Freq: Three times a day (TID) | ORAL | 2 refills | Status: AC

## 2021-03-15 ENCOUNTER — Ambulatory Visit: Payer: Medicare HMO | Admitting: Cardiology

## 2021-03-27 ENCOUNTER — Other Ambulatory Visit: Payer: Self-pay | Admitting: Cardiology

## 2021-03-27 DIAGNOSIS — I10 Essential (primary) hypertension: Secondary | ICD-10-CM

## 2021-05-10 ENCOUNTER — Ambulatory Visit: Payer: Medicare HMO | Admitting: Cardiology

## 2021-06-12 ENCOUNTER — Other Ambulatory Visit: Payer: Self-pay | Admitting: Cardiology

## 2021-06-12 DIAGNOSIS — I10 Essential (primary) hypertension: Secondary | ICD-10-CM

## 2021-07-31 ENCOUNTER — Other Ambulatory Visit: Payer: Self-pay | Admitting: Cardiology

## 2021-07-31 DIAGNOSIS — I10 Essential (primary) hypertension: Secondary | ICD-10-CM

## 2021-09-10 ENCOUNTER — Other Ambulatory Visit: Payer: Self-pay | Admitting: Cardiology

## 2021-09-10 DIAGNOSIS — I10 Essential (primary) hypertension: Secondary | ICD-10-CM

## 2021-12-03 ENCOUNTER — Other Ambulatory Visit: Payer: Self-pay

## 2021-12-03 ENCOUNTER — Emergency Department (HOSPITAL_COMMUNITY): Payer: Medicare HMO

## 2021-12-03 ENCOUNTER — Inpatient Hospital Stay (HOSPITAL_COMMUNITY)
Admission: EM | Admit: 2021-12-03 | Discharge: 2021-12-07 | DRG: 378 | Disposition: A | Discharge status: Home / Self Care | Payer: Medicare HMO | Attending: Internal Medicine | Admitting: Internal Medicine

## 2021-12-03 DIAGNOSIS — Z6832 Body mass index (BMI) 32.0-32.9, adult: Secondary | ICD-10-CM

## 2021-12-03 DIAGNOSIS — K921 Melena: Principal | ICD-10-CM | POA: Diagnosis present

## 2021-12-03 DIAGNOSIS — D62 Acute posthemorrhagic anemia: Secondary | ICD-10-CM | POA: Diagnosis present

## 2021-12-03 DIAGNOSIS — M79672 Pain in left foot: Secondary | ICD-10-CM

## 2021-12-03 DIAGNOSIS — Z91013 Allergy to seafood: Secondary | ICD-10-CM

## 2021-12-03 DIAGNOSIS — I129 Hypertensive chronic kidney disease with stage 1 through stage 4 chronic kidney disease, or unspecified chronic kidney disease: Secondary | ICD-10-CM | POA: Diagnosis present

## 2021-12-03 DIAGNOSIS — I4892 Unspecified atrial flutter: Secondary | ICD-10-CM

## 2021-12-03 DIAGNOSIS — Z833 Family history of diabetes mellitus: Secondary | ICD-10-CM

## 2021-12-03 DIAGNOSIS — K2970 Gastritis, unspecified, without bleeding: Secondary | ICD-10-CM | POA: Diagnosis present

## 2021-12-03 DIAGNOSIS — Z880 Allergy status to penicillin: Secondary | ICD-10-CM

## 2021-12-03 DIAGNOSIS — D649 Anemia, unspecified: Secondary | ICD-10-CM | POA: Diagnosis present

## 2021-12-03 DIAGNOSIS — D509 Iron deficiency anemia, unspecified: Secondary | ICD-10-CM | POA: Diagnosis present

## 2021-12-03 DIAGNOSIS — E669 Obesity, unspecified: Secondary | ICD-10-CM | POA: Diagnosis present

## 2021-12-03 DIAGNOSIS — K644 Residual hemorrhoidal skin tags: Secondary | ICD-10-CM | POA: Diagnosis present

## 2021-12-03 DIAGNOSIS — K5730 Diverticulosis of large intestine without perforation or abscess without bleeding: Secondary | ICD-10-CM | POA: Diagnosis present

## 2021-12-03 DIAGNOSIS — M1A9XX0 Chronic gout, unspecified, without tophus (tophi): Secondary | ICD-10-CM | POA: Diagnosis not present

## 2021-12-03 DIAGNOSIS — K449 Diaphragmatic hernia without obstruction or gangrene: Secondary | ICD-10-CM | POA: Diagnosis present

## 2021-12-03 DIAGNOSIS — Z87440 Personal history of urinary (tract) infections: Secondary | ICD-10-CM

## 2021-12-03 DIAGNOSIS — N179 Acute kidney failure, unspecified: Secondary | ICD-10-CM

## 2021-12-03 DIAGNOSIS — R55 Syncope and collapse: Secondary | ICD-10-CM | POA: Diagnosis present

## 2021-12-03 DIAGNOSIS — R911 Solitary pulmonary nodule: Secondary | ICD-10-CM | POA: Diagnosis present

## 2021-12-03 DIAGNOSIS — E039 Hypothyroidism, unspecified: Secondary | ICD-10-CM | POA: Diagnosis present

## 2021-12-03 DIAGNOSIS — K209 Esophagitis, unspecified without bleeding: Secondary | ICD-10-CM | POA: Diagnosis present

## 2021-12-03 DIAGNOSIS — E1122 Type 2 diabetes mellitus with diabetic chronic kidney disease: Secondary | ICD-10-CM | POA: Diagnosis present

## 2021-12-03 DIAGNOSIS — N1831 Chronic kidney disease, stage 3a: Secondary | ICD-10-CM | POA: Diagnosis present

## 2021-12-03 DIAGNOSIS — Z79891 Long term (current) use of opiate analgesic: Secondary | ICD-10-CM

## 2021-12-03 DIAGNOSIS — Z9049 Acquired absence of other specified parts of digestive tract: Secondary | ICD-10-CM

## 2021-12-03 DIAGNOSIS — Z20822 Contact with and (suspected) exposure to covid-19: Secondary | ICD-10-CM | POA: Diagnosis present

## 2021-12-03 DIAGNOSIS — K623 Rectal prolapse: Secondary | ICD-10-CM | POA: Diagnosis present

## 2021-12-03 DIAGNOSIS — Z560 Unemployment, unspecified: Secondary | ICD-10-CM

## 2021-12-03 DIAGNOSIS — I10 Essential (primary) hypertension: Secondary | ICD-10-CM

## 2021-12-03 DIAGNOSIS — E1169 Type 2 diabetes mellitus with other specified complication: Secondary | ICD-10-CM | POA: Diagnosis present

## 2021-12-03 DIAGNOSIS — Z7901 Long term (current) use of anticoagulants: Secondary | ICD-10-CM

## 2021-12-03 DIAGNOSIS — Z79899 Other long term (current) drug therapy: Secondary | ICD-10-CM

## 2021-12-03 DIAGNOSIS — E876 Hypokalemia: Secondary | ICD-10-CM | POA: Diagnosis present

## 2021-12-03 DIAGNOSIS — Z7984 Long term (current) use of oral hypoglycemic drugs: Secondary | ICD-10-CM

## 2021-12-03 DIAGNOSIS — Z91041 Radiographic dye allergy status: Secondary | ICD-10-CM

## 2021-12-03 DIAGNOSIS — Z8249 Family history of ischemic heart disease and other diseases of the circulatory system: Secondary | ICD-10-CM

## 2021-12-03 DIAGNOSIS — R9431 Abnormal electrocardiogram [ECG] [EKG]: Secondary | ICD-10-CM

## 2021-12-03 DIAGNOSIS — K760 Fatty (change of) liver, not elsewhere classified: Secondary | ICD-10-CM | POA: Diagnosis present

## 2021-12-03 DIAGNOSIS — K643 Fourth degree hemorrhoids: Secondary | ICD-10-CM | POA: Diagnosis present

## 2021-12-03 DIAGNOSIS — I480 Paroxysmal atrial fibrillation: Secondary | ICD-10-CM | POA: Diagnosis present

## 2021-12-03 DIAGNOSIS — I700 Atherosclerosis of aorta: Secondary | ICD-10-CM | POA: Diagnosis present

## 2021-12-03 DIAGNOSIS — M79671 Pain in right foot: Secondary | ICD-10-CM

## 2021-12-03 LAB — CBC WITH DIFFERENTIAL/PLATELET
Abs Immature Granulocytes: 0.05 10*3/uL (ref 0.00–0.07)
Basophils Absolute: 0 10*3/uL (ref 0.0–0.1)
Basophils Relative: 0 %
Eosinophils Absolute: 0 10*3/uL (ref 0.0–0.5)
Eosinophils Relative: 0 %
HCT: 15.1 % — ABNORMAL LOW (ref 36.0–46.0)
Hemoglobin: 4.1 g/dL — CL (ref 12.0–15.0)
Immature Granulocytes: 0 %
Lymphocytes Relative: 15 %
Lymphs Abs: 1.7 10*3/uL (ref 0.7–4.0)
MCH: 19.2 pg — ABNORMAL LOW (ref 26.0–34.0)
MCHC: 27.2 g/dL — ABNORMAL LOW (ref 30.0–36.0)
MCV: 70.6 fL — ABNORMAL LOW (ref 80.0–100.0)
Monocytes Absolute: 0.6 10*3/uL (ref 0.1–1.0)
Monocytes Relative: 5 %
Neutro Abs: 9 10*3/uL — ABNORMAL HIGH (ref 1.7–7.7)
Neutrophils Relative %: 80 %
Platelets: 407 10*3/uL — ABNORMAL HIGH (ref 150–400)
RBC: 2.14 MIL/uL — ABNORMAL LOW (ref 3.87–5.11)
RDW: 16.7 % — ABNORMAL HIGH (ref 11.5–15.5)
WBC: 11.4 10*3/uL — ABNORMAL HIGH (ref 4.0–10.5)
nRBC: 0 % (ref 0.0–0.2)

## 2021-12-03 LAB — COMPREHENSIVE METABOLIC PANEL
ALT: 27 U/L (ref 0–44)
AST: 41 U/L (ref 15–41)
Albumin: 3.7 g/dL (ref 3.5–5.0)
Alkaline Phosphatase: 37 U/L — ABNORMAL LOW (ref 38–126)
Anion gap: 16 — ABNORMAL HIGH (ref 5–15)
BUN: 14 mg/dL (ref 8–23)
CO2: 18 mmol/L — ABNORMAL LOW (ref 22–32)
Calcium: 9.3 mg/dL (ref 8.9–10.3)
Chloride: 101 mmol/L (ref 98–111)
Creatinine, Ser: 1.34 mg/dL — ABNORMAL HIGH (ref 0.44–1.00)
GFR, Estimated: 42 mL/min — ABNORMAL LOW (ref >60)
Glucose, Bld: 246 mg/dL — ABNORMAL HIGH (ref 70–99)
Potassium: 3.5 mmol/L (ref 3.5–5.1)
Sodium: 135 mmol/L (ref 135–145)
Total Bilirubin: 0.8 mg/dL (ref 0.3–1.2)
Total Protein: 7.4 g/dL (ref 6.5–8.1)

## 2021-12-03 LAB — CBG MONITORING, ED: Glucose-Capillary: 239 mg/dL — ABNORMAL HIGH (ref 70–99)

## 2021-12-03 LAB — RESP PANEL BY RT-PCR (FLU A&B, COVID) ARPGX2
Influenza A by PCR: NEGATIVE
Influenza B by PCR: NEGATIVE
SARS Coronavirus 2 by RT PCR: NEGATIVE

## 2021-12-03 LAB — PROTIME-INR
INR: 1.6 — ABNORMAL HIGH (ref 0.8–1.2)
Prothrombin Time: 18.7 seconds — ABNORMAL HIGH (ref 11.4–15.2)

## 2021-12-03 LAB — POC OCCULT BLOOD, ED: Fecal Occult Bld: NEGATIVE

## 2021-12-03 MED ORDER — SODIUM CHLORIDE 0.9 % IV SOLN: INTRAVENOUS | Status: DC

## 2021-12-03 MED ORDER — SODIUM CHLORIDE 0.9 % IV BOLUS
1000.0000 mL | Freq: Once | INTRAVENOUS | Status: AC
  Administered 2021-12-03: 23:00:00 1000 mL via INTRAVENOUS

## 2021-12-03 NOTE — ED Provider Notes (Signed)
[facility] EMERGENCY DEPARTMENT Provider Note   CSN: [id number] Arrival date & time: 12/03/21  2117     History Chief Complaint  Patient presents with   Loss of Consciousness   Hematuria    Frances Cabrera is a 75 y.o. female.  Pt presents to the ED today with syncope.  Pt said she has not been feeling well for the past 3 days and has not gotten out of bed.  Pt said she got out of bed today and went to the bathroom.  She passed out in the bathroom.  She remembers feeling sweaty and then woke up on the ground.  She has had some rectal bleeding intermittently for 3 days.  She is on Eliquis.  She is unsure if she hit her head.      Past Medical History:  Diagnosis Date   Diabetes mellitus    Elevated LFTs    Hypertension    Menopause    Microalbuminuria    Obesity    Paroxysmal atrial flutter (HCC)    Recurrent UTI    post-coital    Patient Active Problem List   Diagnosis Date Noted   Diabetes mellitus type 2 in obese ([place]) 05/30/2012   HTN (hypertension) 05/30/2012   Microalbuminuria    Recurrent UTI    BMI 35.0-35.9,adult    Elevated LFTs     Past Surgical History:  Procedure Laterality Date   CHOLECYSTECTOMY  2000   TUBAL LIGATION       OB History   No obstetric history on file.     Family History  Problem Relation Age of Onset   Diabetes Mother    Hypertension Mother    Kidney failure Mother    Diabetes Father    Diabetes Brother    Diabetes Brother     Social History   Tobacco Use   Smoking status: Never   Smokeless tobacco: Never  Vaping Use   Vaping Use: Never used  Substance Use Topics   Alcohol use: No    Alcohol/week: 0.0 standard drinks   Drug use: No    Home Medications Prior to Admission medications   Medication Sig Start Date End Date Taking? Authorizing Provider  acetaminophen (TYLENOL) 500 MG tablet Take 750 mg by mouth every 6 (six) hours as needed for moderate pain or headache. Reported on  05/31/2016    [provider]  Blood Glucose Monitoring Suppl (ACCU-CHEK AVIVA PLUS) w/Device KIT USE AS DIRECTED 01/02/17   Harrison Mons, PA  Blood Pressure Monitoring (B-D ASSURE BPM/AUTO WRIST CUFF) MISC Use to measure home blood pressure. 09/05/15   Harrison Mons, PA  clotrimazole-betamethasone (LOTRISONE) cream Apply 1 application topically as needed. 06/28/20   [provider]  ELIQUIS 5 MG TABS tablet Take 1 tablet by mouth twice daily 05/24/20   Tolia, Sunit, DO  EUTHYROX 25 MCG tablet Take 25 mcg by mouth every morning. 06/19/20   [provider]  gabapentin (NEURONTIN) 300 MG capsule Take 300 mg by mouth in the morning and at bedtime. 01/26/20   [provider]  glucose blood test strip Use to test blood sugar once daily. E11.9 02/08/16   Harrison Mons, PA  Ketotifen Fumarate (ALAWAY OP) Place 1 drop into both eyes 2 (two) times daily.    [provider]  Lancet Device MISC Use for home glucose monitoring 02/08/16   Harrison Mons, PA  magnesium oxide (MAG-OX) 400 (240 Mg) MG tablet TAKE 1 TABLET THREE TIMES DAILY 06/12/21  Tolia, Sunit, DO  metFORMIN (GLUCOPHAGE) 1000 MG tablet Take 1 tablet (1,000 mg total) by mouth 2 (two) times daily. Patient taking differently: Take 500 mg by mouth 2 (two) times daily.  12/08/18   Dorie Rank, MD  metoprolol succinate (TOPROL-XL) 100 MG 24 hr tablet Take 1 tablet (100 mg total) by mouth in the morning. Take with or immediately following a meal. Hold if systolic blood pressure (top blood pressure number) less than 100 mmHg or heart rate less than 60 bpm (pulse). 08/28/20 02/24/21  Tolia, Sunit, DO  Multiple Vitamins-Minerals (MULTIVITAMIN WITH MINERALS) tablet Take 1 tablet by mouth daily. Centrum Silver    [provider]  potassium chloride (KLOR-CON) 10 MEQ tablet Take 10 mEq by mouth daily. 04/08/20   [provider]  traMADol (ULTRAM) 50 MG tablet Take 1 tablet by mouth as needed. 03/31/20    [provider]  triamterene-hydrochlorothiazide (DYAZIDE) 37.5-25 MG capsule TAKE 1 CAPSULE EVERY DAY Patient taking differently: Take 1 capsule by mouth daily. Taking in the morning 02/12/18   Harrison Mons, PA  verapamil (CALAN-SR) 240 MG CR tablet TAKE 1 TABLET TWICE DAILY 02/12/18   Harrison Mons, PA    Allergies    Penicillins, Iodinated diagnostic agents, and Shrimp [shellfish allergy]  Review of Systems   Review of Systems  Gastrointestinal:  Positive for blood in stool.  Neurological:  Positive for syncope and weakness.  All other systems reviewed and are negative.  Physical Exam Updated Vital Signs BP (!) 141/70 (BP Location: Right Arm)    Pulse 81    Temp 98.8 F (37.1 C) (Oral)    Resp 18    Ht _0  (1.803 m)    Wt 104.3 kg    SpO2 100%    BMI 32.08 kg/m   Physical Exam Vitals and nursing note reviewed. Exam conducted with a chaperone present.  Constitutional:      Appearance: Normal appearance.  HENT:     Head: Normocephalic and atraumatic.     Right Ear: External ear normal.     Left Ear: External ear normal.     Nose: Nose normal.     Mouth/Throat:     Mouth: Mucous membranes are dry.  Eyes:     Extraocular Movements: Extraocular movements intact.     Conjunctiva/sclera: Conjunctivae normal.     Pupils: Pupils are equal, round, and reactive to light.  Cardiovascular:     Rate and Rhythm: Regular rhythm. Tachycardia present.     Pulses: Normal pulses.     Heart sounds: Normal heart sounds.  Pulmonary:     Effort: Pulmonary effort is normal.     Breath sounds: Normal breath sounds.  Abdominal:     General: Abdomen is flat. Bowel sounds are normal.     Palpations: Abdomen is soft.  Genitourinary:    Rectum: Guaiac result negative. External hemorrhoid present.  Musculoskeletal:        General: Normal range of motion.     Cervical back: Normal range of motion and neck supple.  Skin:    General: Skin is warm.     Capillary Refill: Capillary  refill takes less than 2 seconds.  Neurological:     General: No focal deficit present.     Mental Status: She is alert and oriented to person, place, and time.  Psychiatric:        Mood and Affect: Mood normal.        Behavior: Behavior normal.  Thought Content: Thought content normal.        Judgment: Judgment normal.    ED Results / Procedures / Treatments   Labs (all labs ordered are listed, but only abnormal results are displayed) Labs Reviewed  COMPREHENSIVE METABOLIC PANEL - Abnormal; Notable for the following components:      Result Value   CO2 18 (*)    Glucose, Bld 246 (*)    Creatinine, Ser 1.34 (*)    Alkaline Phosphatase 37 (*)    GFR, Estimated 42 (*)    Anion gap 16 (*)    All other components within normal limits  CBG MONITORING, ED - Abnormal; Notable for the following components:   Glucose-Capillary 239 (*)    All other components within normal limits  RESP PANEL BY RT-PCR (FLU A&B, COVID) ARPGX2  CBC WITH DIFFERENTIAL/PLATELET  URINALYSIS, ROUTINE W REFLEX MICROSCOPIC  CBC WITH DIFFERENTIAL/PLATELET  PROTIME-INR  CBC WITH DIFFERENTIAL/PLATELET  POC OCCULT BLOOD, ED    EKG EKG Interpretation  Date/Time:  Tuesday December 03 2021 21:21:42 EST Ventricular Rate:  100 PR Interval:  193 QRS Duration: 161 QT Interval:  419 QTC Calculation: 541 R Axis:   59 Text Interpretation: Sinus tachycardia Right bundle branch block No old tracing to compare Confirmed by Isla Pence [phone]) on 12/03/2021 10:39:00 PM  Radiology DG Chest Port 1 View  Result Date: 12/03/2021 CLINICAL DATA:  Syncope. EXAM: PORTABLE CHEST 1 VIEW COMPARISON:  August 31, 2019 FINDINGS: There is no evidence of acute infiltrate, pleural effusion or pneumothorax. The heart size and mediastinal contours are within normal limits. The visualized skeletal structures are unremarkable. IMPRESSION: No active disease. Electronically Signed   By: Virgina Norfolk M.D.   On: 12/03/2021  22:02    Procedures Procedures   Medications Ordered in ED Medications  sodium chloride 0.9 % bolus 1,000 mL (1,000 mLs Intravenous New Bag/Given 12/03/21 2231)    And  0.9 %  sodium chloride infusion (has no administration in time range)    ED Course  I have reviewed the triage vital signs and the nursing notes.  Pertinent labs & imaging results that were available during my care of the patient were reviewed by me and considered in my medical decision making (see chart for details).    MDM Rules/Calculators/A&P                         Syncope is likely from volume depletion from her description of sx.  However, labs are still pending.  Pt given IVFs.  CT ok.  CXR ok.  Pt signed out to Dr. Sedonia Small at shift change.   Final Clinical Impression(s) / ED Diagnoses Final diagnoses:  Syncope, unspecified syncope type    Rx / DC Orders ED Discharge Orders     None        Isla Pence, MD 12/03/21 2318

## 2021-12-03 NOTE — ED Provider Notes (Signed)
°  Provider Note MRN:  [id number]  Arrival date & time: 12/04/21    ED Course and Medical Decision Making  Assumed care from Dr. Particia Nearing at shift change.  Syncope, favoring benign etiology related to hypovolemia will reassess after fluids.  12:30 AM update: Patient's labs return with a hemoglobin of 4.  Normal hemoglobin 2 years ago.  Patient does endorse some intermittent blood in stool, bright red.  Hemoccult today was negative.  She is on Eliquis, unclear why.  I see no history of A. fib or stroke or VTE in her chart.  Will transfuse and admit to medicine.  .Critical Care Performed by: Sabas Sous, MD Authorized by: Sabas Sous, MD   Critical care provider statement:    Critical care time (minutes):  35   Critical care was necessary to treat or prevent imminent or life-threatening deterioration of the following conditions: Symptomatic anemia requiring blood transfusion.   Critical care was time spent personally by me on the following activities:  Development of treatment plan with patient or surrogate, discussions with consultants, evaluation of patient's response to treatment, examination of patient, ordering and review of laboratory studies, ordering and review of radiographic studies, ordering and performing treatments and interventions, pulse oximetry, re-evaluation of patient's condition and review of old charts   I assumed direction of critical care for this patient from another provider in my specialty: yes    Final Clinical Impressions(s) / ED Diagnoses     ICD-10-CM   1. Syncope, unspecified syncope type  R55     2. Symptomatic anemia  D64.9       ED Discharge Orders     None       Discharge Instructions   None     Elmer Sow. Pilar Plate, MD [facility] [email or web address]    Sabas Sous, MD 12/04/21 [phone]

## 2021-12-03 NOTE — ED Triage Notes (Signed)
Pt BIB EMS from home. Per EMS pt got out of bed tonight and had a syncope episode. Pt does not know how long she was out but came to and called her son. Pt always reports hematuria for the last 3 days with abdominal cramping. Denies N/V  Pt is on Eliquis 2x/day - last dose this morning  BP with EMS initial 238/84, last 166/70 HR 92 99% on RA  CBG 340

## 2021-12-03 NOTE — ED Notes (Signed)
MD Bero notified of hgb

## 2021-12-04 ENCOUNTER — Encounter (HOSPITAL_COMMUNITY): Payer: Self-pay | Admitting: Family Medicine

## 2021-12-04 ENCOUNTER — Inpatient Hospital Stay (HOSPITAL_COMMUNITY): Payer: Medicare HMO

## 2021-12-04 DIAGNOSIS — E039 Hypothyroidism, unspecified: Secondary | ICD-10-CM | POA: Diagnosis present

## 2021-12-04 DIAGNOSIS — E1122 Type 2 diabetes mellitus with diabetic chronic kidney disease: Secondary | ICD-10-CM | POA: Diagnosis present

## 2021-12-04 DIAGNOSIS — D649 Anemia, unspecified: Secondary | ICD-10-CM

## 2021-12-04 DIAGNOSIS — K5730 Diverticulosis of large intestine without perforation or abscess without bleeding: Secondary | ICD-10-CM | POA: Diagnosis present

## 2021-12-04 DIAGNOSIS — E876 Hypokalemia: Secondary | ICD-10-CM | POA: Diagnosis present

## 2021-12-04 DIAGNOSIS — R9431 Abnormal electrocardiogram [ECG] [EKG]: Secondary | ICD-10-CM

## 2021-12-04 DIAGNOSIS — N179 Acute kidney failure, unspecified: Secondary | ICD-10-CM | POA: Diagnosis present

## 2021-12-04 DIAGNOSIS — K644 Residual hemorrhoidal skin tags: Secondary | ICD-10-CM | POA: Diagnosis present

## 2021-12-04 DIAGNOSIS — R911 Solitary pulmonary nodule: Secondary | ICD-10-CM | POA: Diagnosis present

## 2021-12-04 DIAGNOSIS — D62 Acute posthemorrhagic anemia: Secondary | ICD-10-CM | POA: Diagnosis present

## 2021-12-04 DIAGNOSIS — I480 Paroxysmal atrial fibrillation: Secondary | ICD-10-CM | POA: Diagnosis present

## 2021-12-04 DIAGNOSIS — M1A9XX0 Chronic gout, unspecified, without tophus (tophi): Secondary | ICD-10-CM | POA: Diagnosis not present

## 2021-12-04 DIAGNOSIS — N1831 Chronic kidney disease, stage 3a: Secondary | ICD-10-CM | POA: Diagnosis present

## 2021-12-04 DIAGNOSIS — K921 Melena: Principal | ICD-10-CM

## 2021-12-04 DIAGNOSIS — K449 Diaphragmatic hernia without obstruction or gangrene: Secondary | ICD-10-CM | POA: Diagnosis present

## 2021-12-04 DIAGNOSIS — K760 Fatty (change of) liver, not elsewhere classified: Secondary | ICD-10-CM | POA: Diagnosis present

## 2021-12-04 DIAGNOSIS — D509 Iron deficiency anemia, unspecified: Secondary | ICD-10-CM | POA: Diagnosis present

## 2021-12-04 DIAGNOSIS — Z20822 Contact with and (suspected) exposure to covid-19: Secondary | ICD-10-CM | POA: Diagnosis present

## 2021-12-04 DIAGNOSIS — K643 Fourth degree hemorrhoids: Secondary | ICD-10-CM | POA: Diagnosis present

## 2021-12-04 DIAGNOSIS — E669 Obesity, unspecified: Secondary | ICD-10-CM | POA: Diagnosis present

## 2021-12-04 DIAGNOSIS — K623 Rectal prolapse: Secondary | ICD-10-CM | POA: Diagnosis present

## 2021-12-04 DIAGNOSIS — R55 Syncope and collapse: Secondary | ICD-10-CM

## 2021-12-04 DIAGNOSIS — K2980 Duodenitis without bleeding: Secondary | ICD-10-CM | POA: Diagnosis not present

## 2021-12-04 DIAGNOSIS — K2970 Gastritis, unspecified, without bleeding: Secondary | ICD-10-CM | POA: Diagnosis not present

## 2021-12-04 DIAGNOSIS — K209 Esophagitis, unspecified without bleeding: Secondary | ICD-10-CM | POA: Diagnosis present

## 2021-12-04 DIAGNOSIS — I700 Atherosclerosis of aorta: Secondary | ICD-10-CM | POA: Diagnosis present

## 2021-12-04 DIAGNOSIS — I129 Hypertensive chronic kidney disease with stage 1 through stage 4 chronic kidney disease, or unspecified chronic kidney disease: Secondary | ICD-10-CM | POA: Diagnosis present

## 2021-12-04 LAB — CBC
HCT: 17.9 % — ABNORMAL LOW (ref 36.0–46.0)
HCT: 23.5 % — ABNORMAL LOW (ref 36.0–46.0)
Hemoglobin: 4.9 g/dL — CL (ref 12.0–15.0)
Hemoglobin: 7.8 g/dL — ABNORMAL LOW (ref 12.0–15.0)
MCH: 19 pg — ABNORMAL LOW (ref 26.0–34.0)
MCH: 25 pg — ABNORMAL LOW (ref 26.0–34.0)
MCHC: 27.4 g/dL — ABNORMAL LOW (ref 30.0–36.0)
MCHC: 33.2 g/dL (ref 30.0–36.0)
MCV: 69.4 fL — ABNORMAL LOW (ref 80.0–100.0)
MCV: 75.3 fL — ABNORMAL LOW (ref 80.0–100.0)
Platelets: 357 10*3/uL (ref 150–400)
Platelets: 327 10*3/uL (ref 150–400)
RBC: 2.58 MIL/uL — ABNORMAL LOW (ref 3.87–5.11)
RBC: 3.12 MIL/uL — ABNORMAL LOW (ref 3.87–5.11)
RDW: 16.5 % — ABNORMAL HIGH (ref 11.5–15.5)
RDW: 20.9 % — ABNORMAL HIGH (ref 11.5–15.5)
WBC: 10.1 10*3/uL (ref 4.0–10.5)
WBC: 9.2 10*3/uL (ref 4.0–10.5)
nRBC: 0 % (ref 0.0–0.2)
nRBC: 0.4 % — ABNORMAL HIGH (ref 0.0–0.2)

## 2021-12-04 LAB — RETICULOCYTES
Immature Retic Fract: 35.8 % — ABNORMAL HIGH (ref 2.3–15.9)
RBC.: 2.57 MIL/uL — ABNORMAL LOW (ref 3.87–5.11)
Retic Count, Absolute: 73.2 10*3/uL (ref 19.0–186.0)
Retic Ct Pct: 2.9 % (ref 0.4–3.1)

## 2021-12-04 LAB — VITAMIN B12: Vitamin B-12: 524 pg/mL (ref 180–914)

## 2021-12-04 LAB — FOLATE: Folate: 22.7 ng/mL (ref >5.9)

## 2021-12-04 LAB — BASIC METABOLIC PANEL
Anion gap: 12 (ref 5–15)
BUN: 14 mg/dL (ref 8–23)
CO2: 19 mmol/L — ABNORMAL LOW (ref 22–32)
Calcium: 8.8 mg/dL — ABNORMAL LOW (ref 8.9–10.3)
Chloride: 102 mmol/L (ref 98–111)
Creatinine, Ser: 1.29 mg/dL — ABNORMAL HIGH (ref 0.44–1.00)
GFR, Estimated: 44 mL/min — ABNORMAL LOW (ref >60)
Glucose, Bld: 228 mg/dL — ABNORMAL HIGH (ref 70–99)
Potassium: 3.8 mmol/L (ref 3.5–5.1)
Sodium: 133 mmol/L — ABNORMAL LOW (ref 135–145)

## 2021-12-04 LAB — CBG MONITORING, ED
Glucose-Capillary: 177 mg/dL — ABNORMAL HIGH (ref 70–99)
Glucose-Capillary: 159 mg/dL — ABNORMAL HIGH (ref 70–99)

## 2021-12-04 LAB — ABO/RH: ABO/RH(D): B POS

## 2021-12-04 LAB — TSH: TSH: 3.21 u[IU]/mL (ref 0.350–4.500)

## 2021-12-04 LAB — IRON AND TIBC
Iron: 14 ug/dL — ABNORMAL LOW (ref 28–170)
Saturation Ratios: 2 % — ABNORMAL LOW (ref 10.4–31.8)
TIBC: 629 ug/dL — ABNORMAL HIGH (ref 250–450)
UIBC: 615 ug/dL

## 2021-12-04 LAB — PREPARE RBC (CROSSMATCH)

## 2021-12-04 LAB — FERRITIN: Ferritin: 4 ng/mL — ABNORMAL LOW (ref 11–307)

## 2021-12-04 MED ORDER — LACTATED RINGERS IV SOLN: INTRAVENOUS | Status: DC

## 2021-12-04 MED ORDER — PEG-KCL-NACL-NASULF-NA ASC-C 100 G PO SOLR
0.5000 | Freq: Once | ORAL | Status: AC
  Administered 2021-12-04: 22:00:00 100 g via ORAL
  Filled 2021-12-04: qty 1

## 2021-12-04 MED ORDER — METOCLOPRAMIDE HCL 5 MG/ML IJ SOLN
10.0000 mg | Freq: Once | INTRAMUSCULAR | Status: AC
  Administered 2021-12-05: 04:00:00 10 mg via INTRAVENOUS
  Filled 2021-12-04: qty 2

## 2021-12-04 MED ORDER — PEG-KCL-NACL-NASULF-NA ASC-C 100 G PO SOLR
0.5000 | Freq: Once | ORAL | Status: AC
  Administered 2021-12-05: 04:00:00 100 g via ORAL

## 2021-12-04 MED ORDER — METFORMIN HCL 500 MG PO TABS
500.0000 mg | ORAL_TABLET | Freq: Two times a day (BID) | ORAL | Status: DC
  Administered 2021-12-04 – 2021-12-05 (×3): 500 mg via ORAL
  Filled 2021-12-04 (×3): qty 1

## 2021-12-04 MED ORDER — GABAPENTIN 600 MG PO TABS
300.0000 mg | ORAL_TABLET | Freq: Two times a day (BID) | ORAL | Status: DC
  Administered 2021-12-04 – 2021-12-07 (×7): 300 mg via ORAL
  Filled 2021-12-04: qty 1
  Filled 2021-12-04: qty 0.5
  Filled 2021-12-04 (×4): qty 1
  Filled 2021-12-04: qty 0.5
  Filled 2021-12-04 (×2): qty 1

## 2021-12-04 MED ORDER — SODIUM CHLORIDE 0.9 % IV SOLN: 10.0000 mL/h | Freq: Once | INTRAVENOUS | Status: DC

## 2021-12-04 MED ORDER — METFORMIN HCL 500 MG PO TABS: 500.0000 mg | ORAL_TABLET | Freq: Two times a day (BID) | ORAL | Status: DC

## 2021-12-04 MED ORDER — TRIAMTERENE-HCTZ 37.5-25 MG PO TABS
1.0000 | ORAL_TABLET | Freq: Every day | ORAL | Status: DC
  Administered 2021-12-04 – 2021-12-05 (×2): 1 via ORAL
  Filled 2021-12-04 (×2): qty 1

## 2021-12-04 MED ORDER — VERAPAMIL HCL ER 240 MG PO TBCR
240.0000 mg | EXTENDED_RELEASE_TABLET | Freq: Two times a day (BID) | ORAL | Status: DC
  Administered 2021-12-04 – 2021-12-05 (×3): 240 mg via ORAL
  Filled 2021-12-04 (×5): qty 1

## 2021-12-04 MED ORDER — DIPHENHYDRAMINE HCL 25 MG PO CAPS
25.0000 mg | ORAL_CAPSULE | Freq: Once | ORAL | Status: AC
  Administered 2021-12-04: 02:00:00 25 mg via ORAL
  Filled 2021-12-04: qty 1

## 2021-12-04 MED ORDER — BISACODYL 5 MG PO TBEC
20.0000 mg | DELAYED_RELEASE_TABLET | Freq: Once | ORAL | Status: AC
  Administered 2021-12-04: 13:00:00 20 mg via ORAL
  Filled 2021-12-04: qty 4

## 2021-12-04 MED ORDER — FUROSEMIDE 10 MG/ML IJ SOLN
20.0000 mg | Freq: Once | INTRAMUSCULAR | Status: AC
  Administered 2021-12-04: 06:00:00 20 mg via INTRAVENOUS
  Filled 2021-12-04: qty 2

## 2021-12-04 MED ORDER — METOCLOPRAMIDE HCL 5 MG/ML IJ SOLN: 10.0000 mg | Freq: Once | INTRAMUSCULAR | Status: DC

## 2021-12-04 MED ORDER — ACETAMINOPHEN 650 MG RE SUPP
650.0000 mg | Freq: Four times a day (QID) | RECTAL | Status: DC | PRN
  Administered 2021-12-04: 12:00:00 650 mg via RECTAL

## 2021-12-04 MED ORDER — SODIUM CHLORIDE 0.9% IV SOLUTION: Freq: Once | INTRAVENOUS | Status: DC

## 2021-12-04 MED ORDER — INSULIN ASPART 100 UNIT/ML IJ SOLN
0.0000 [IU] | Freq: Three times a day (TID) | INTRAMUSCULAR | Status: DC
Start: 2021-12-04 — End: 2021-12-07
  Administered 2021-12-04 – 2021-12-05 (×3): 2 [IU] via SUBCUTANEOUS
  Administered 2021-12-06 (×2): 3 [IU] via SUBCUTANEOUS
  Administered 2021-12-06 – 2021-12-07 (×2): 1 [IU] via SUBCUTANEOUS

## 2021-12-04 MED ORDER — ACETAMINOPHEN 325 MG PO TABS
650.0000 mg | ORAL_TABLET | Freq: Once | ORAL | Status: AC
  Administered 2021-12-04: 02:00:00 650 mg via ORAL
  Filled 2021-12-04: qty 2

## 2021-12-04 MED ORDER — LEVOTHYROXINE SODIUM 25 MCG PO TABS
25.0000 ug | ORAL_TABLET | Freq: Every morning | ORAL | Status: DC
  Administered 2021-12-04 – 2021-12-07 (×3): 25 ug via ORAL
  Filled 2021-12-04 (×2): qty 1

## 2021-12-04 MED ORDER — PEG-KCL-NACL-NASULF-NA ASC-C 100 G PO SOLR
1.0000 | Freq: Once | ORAL | Status: DC
Start: 2021-12-04 — End: 2021-12-04

## 2021-12-04 MED ORDER — ACETAMINOPHEN 325 MG PO TABS
650.0000 mg | ORAL_TABLET | Freq: Four times a day (QID) | ORAL | Status: DC | PRN
  Administered 2021-12-06: 19:00:00 650 mg via ORAL
  Filled 2021-12-04 (×2): qty 2

## 2021-12-04 NOTE — Progress Notes (Signed)
Patient admitted early this morning , detail please see HPI , her vital signs are stable , she is not in acute distress , AAOx3 , tolerating  prbc transfusion, denies chest pain, no sob, no dizziness, reports had bm in the ED stool color is brown with fresh blood, denies ab pain, no n/v Reports bilateral foot pain, wonder if she has gout flare, will get foot x rays and check uric acid Notified GI

## 2021-12-04 NOTE — Progress Notes (Signed)
Pt admitted to the unit at 1500. Pt mental status is Alert. Pt oriented to room, staff, and call bell. Skin is intact. Full assessment charted in [facility]. Call bell within reach. Visitor guidelines reviewed w/ pt and/or family.

## 2021-12-04 NOTE — ED Notes (Signed)
This RN confirmed that blood bank had type and screen sample

## 2021-12-04 NOTE — H&P (Signed)
History and Physical    Frances Cabrera UXN:[id number] DOB: 31-Dec-1945 DOA: 12/03/2021  PCP: Bartholome Bill, MD   Patient coming from: Home  Chief Complaint: syncope, fatigue  HPI: Frances Cabrera is a 75 y.o. female with medical history significant for HTN, DMT2 who presents for evaluation of syncope.  She reports that for the last week she has had decreased energy level and she has been very tired for the last 3 days.  She reports when she has been standing up she gets dizzy and today she went to the bathroom and remembers turning on the water in the sink and then remembers waking up on the floor.  She is not sure how long she was on the ground.  Does not remember hitting her head but she states she has no head pain or knots in her head to suggest she hit her head.  She felt very sweaty.  She has been having shortness of breath if she exerts herself for the last week.  She reports she has had intermittent rectal bleeding for the last 3 to 4 days.  She is on Eliquis but she is not sure why she takes the Eliquis.  She denies having any chest pain or palpitations prior to the syncopal episode.  She has never had a colonoscopy.  She denies any recent fever or illness.  Denies any recent change in her diet.  She has not had any recent travel.  ED Course: She has been hemodynamically stable in the emergency room.  She was found to be profoundly anemic with a hemoglobin of 4.1.  WBC 11,400 hematocrit 15.1 platelets 407,000.  Sodium 135 potassium 3.5 chloride 101 bicarb 18 creatinine 1.34 which is increased from a baseline of 1 BUN 14 calcium 9.3 alk phosphatase 37 AST 41 ALT 27 bilirubin 0.8.  COVID-negative.  Influenza A and B are negative  Review of Systems:  General: Reports dizziness when stands up. Reports fatigue. Denies fever, chills, weight loss, night sweats. Denies change in appetite HENT: Denies head trauma, headache, denies change in hearing, tinnitus. Denies nasal bleeding.  Denies sore throat.  Denies difficulty swallowing Eyes: Denies blurry vision, pain in eye, drainage.  Denies discoloration of eyes. Neck: Denies pain.  Denies swelling.  Denies pain with movement. Cardiovascular: Denies chest pain, palpitations.  Denies edema.  Denies orthopnea Respiratory: Denies shortness of breath, cough.  Denies wheezing.  Denies sputum production Gastrointestinal: Reports intermittent blood in stool. Denies abdominal pain. Denies nausea, vomiting, diarrhea. Denies melena.  Denies hematemesis. Musculoskeletal: Denies limitation of movement. Denies deformity or swelling. Denies pain.  Denies arthralgias or myalgias. Genitourinary: Denies pelvic pain.  Denies urinary frequency or hesitancy. Denies dysuria.  Skin: Denies rash.  Denies petechiae, purpura, ecchymosis. Neurological: Denies seizure activity.  Denies paresthesia.  Denies slurred speech, drooping face.  Denies visual change. Psychiatric: Denies depression, anxiety. Denies hallucinations.  Past Medical History:  Diagnosis Date   Diabetes mellitus    Elevated LFTs    Hypertension    Menopause    Microalbuminuria    Obesity    Paroxysmal atrial flutter ([place])    Recurrent UTI    post-coital    Past Surgical History:  Procedure Laterality Date   CHOLECYSTECTOMY  2000   TUBAL LIGATION      Social History  reports that she has never smoked. She has never used smokeless tobacco. She reports that she does not drink alcohol and does not use drugs.  Allergies  Allergen Reactions   Penicillins  Hives    Has patient had a PCN reaction causing immediate rash, facial/tongue/throat swelling, SOB or lightheadedness with hypotension: Yes Has patient had a PCN reaction causing severe rash involving mucus membranes or skin necrosis: Yes Has patient had a PCN reaction that required hospitalization No Has patient had a PCN reaction occurring within the last 10 years: No If all of the above answers are "NO", then may  proceed with Cephalosporin use.    Iodinated Diagnostic Agents Rash   Shrimp [Shellfish Allergy] Rash    Family History  Problem Relation Age of Onset   Diabetes Mother    Hypertension Mother    Kidney failure Mother    Diabetes Father    Diabetes Brother    Diabetes Brother      Prior to Admission medications   Medication Sig Start Date End Date Taking? Authorizing Provider  acetaminophen (TYLENOL) 500 MG tablet Take 750 mg by mouth every 6 (six) hours as needed for moderate pain or headache. Reported on 05/31/2016    [provider]  Blood Glucose Monitoring Suppl (ACCU-CHEK AVIVA PLUS) w/Device KIT USE AS DIRECTED 01/02/17   Harrison Mons, PA  Blood Pressure Monitoring (B-D ASSURE BPM/AUTO WRIST CUFF) MISC Use to measure home blood pressure. 09/05/15   Harrison Mons, PA  clotrimazole-betamethasone (LOTRISONE) cream Apply 1 application topically as needed. 06/28/20   [provider]  ELIQUIS 5 MG TABS tablet Take 1 tablet by mouth twice daily 05/24/20   Tolia, Sunit, DO  EUTHYROX 25 MCG tablet Take 25 mcg by mouth every morning. 06/19/20   [provider]  gabapentin (NEURONTIN) 300 MG capsule Take 300 mg by mouth in the morning and at bedtime. 01/26/20   [provider]  glucose blood test strip Use to test blood sugar once daily. E11.9 02/08/16   Harrison Mons, PA  Ketotifen Fumarate (ALAWAY OP) Place 1 drop into both eyes 2 (two) times daily.    [provider]  Lancet Device MISC Use for home glucose monitoring 02/08/16   Harrison Mons, PA  magnesium oxide (MAG-OX) 400 (240 Mg) MG tablet TAKE 1 TABLET THREE TIMES DAILY 06/12/21   Tolia, Sunit, DO  metFORMIN (GLUCOPHAGE) 1000 MG tablet Take 1 tablet (1,000 mg total) by mouth 2 (two) times daily. Patient taking differently: Take 500 mg by mouth 2 (two) times daily.  12/08/18   Dorie Rank, MD  metoprolol succinate (TOPROL-XL) 100 MG 24 hr tablet Take 1 tablet (100 mg total) by mouth in the  morning. Take with or immediately following a meal. Hold if systolic blood pressure (top blood pressure number) less than 100 mmHg or heart rate less than 60 bpm (pulse). 08/28/20 02/24/21  Tolia, Sunit, DO  Multiple Vitamins-Minerals (MULTIVITAMIN WITH MINERALS) tablet Take 1 tablet by mouth daily. Centrum Silver    [provider]  potassium chloride (KLOR-CON) 10 MEQ tablet Take 10 mEq by mouth daily. 04/08/20   [provider]  traMADol (ULTRAM) 50 MG tablet Take 1 tablet by mouth as needed. 03/31/20   [provider]  triamterene-hydrochlorothiazide (DYAZIDE) 37.5-25 MG capsule TAKE 1 CAPSULE EVERY DAY Patient taking differently: Take 1 capsule by mouth daily. Taking in the morning 02/12/18   Harrison Mons, PA  verapamil (CALAN-SR) 240 MG CR tablet TAKE 1 TABLET TWICE DAILY 02/12/18   Harrison Mons, PA    Physical Exam: Vitals:   12/03/21 2230 12/03/21 2243 12/04/21 0015 12/04/21 0030  BP: (!) 153/61 (!) 141/70 (!) 151/64 (!) 144/59  Pulse:  81 100 97  Resp: 15 18 16 17   Temp:      TempSrc:      SpO2:  100% 100% 100%  Weight:      Height:        Constitutional: NAD, calm, comfortable Vitals:   12/03/21 2230 12/03/21 2243 12/04/21 0015 12/04/21 0030  BP: (!) 153/61 (!) 141/70 (!) 151/64 (!) 144/59  Pulse:  81 100 97  Resp: 15 18 16 17   Temp:      TempSrc:      SpO2:  100% 100% 100%  Weight:      Height:       General: WDWN, Alert and oriented x3.  Eyes: EOMI, PERRL, conjunctivae pale.  Sclera nonicteric HENT:  [place]/AT, external ears normal.  Nares patent without epistasis.  Mucous membranes are pale. Posterior pharynx clear of any exudate  Neck: Soft, normal range of motion, supple, no masses, no thyromegaly.  Trachea midline Respiratory: clear to auscultation bilaterally, no wheezing, no crackles. Normal respiratory effort. No accessory muscle use.  Cardiovascular: Regular rate and rhythm, no murmurs / rubs / gallops. No extremity edema. 2+ pedal  pulses. No JVD. Capillary refill 2-3 seconds. Nail beds pale Abdomen: Soft, no tenderness, nondistended, no rebound or guarding. Obese. Bowel sounds normoactive Musculoskeletal: FROM. no cyanosis. No joint deformity upper and lower extremities. Normal muscle tone.  Skin: Warm, dry, intact no rashes, lesions, ulcers. No induration Neurologic: CN 2-12 grossly intact.  Normal speech.  Sensation intact to touch. Strength 5/5 in all extremities.   Psychiatric: Normal judgment and insight.  Normal mood.    Labs on Admission: I have personally reviewed following labs and imaging studies  CBC: Recent Labs  Lab 12/03/21 2330  WBC 11.4*  NEUTROABS 9.0*  HGB 4.1*  HCT 15.1*  MCV 70.6*  PLT 407*    Basic Metabolic Panel: Recent Labs  Lab 12/03/21 2225  NA 135  K 3.5  CL 101  CO2 18*  GLUCOSE 246*  BUN 14  CREATININE 1.34*  CALCIUM 9.3    GFR: Estimated Creatinine Clearance: 49.7 mL/min (A) (by C-G formula based on SCr of 1.34 mg/dL (H)).  Liver Function Tests: Recent Labs  Lab 12/03/21 2225  AST 41  ALT 27  ALKPHOS 37*  BILITOT 0.8  PROT 7.4  ALBUMIN 3.7    Urine analysis:    Component Value Date/Time   COLORURINE YELLOW 12/08/2019 1421   APPEARANCEUR CLOUDY (A) 12/08/2019 1421   LABSPEC 1.031 (H) 12/08/2019 1421   PHURINE 5.0 12/08/2019 1421   GLUCOSEU NEGATIVE 12/08/2019 1421   HGBUR NEGATIVE 12/08/2019 1421   BILIRUBINUR NEGATIVE 12/08/2019 1421   BILIRUBINUR negative 09/05/2015 1655   BILIRUBINUR neg 05/30/2012 0853   KETONESUR 5 (A) 12/08/2019 1421   PROTEINUR 30 (A) 12/08/2019 1421   UROBILINOGEN 0.2 09/05/2015 1655   NITRITE NEGATIVE 12/08/2019 1421   LEUKOCYTESUR NEGATIVE 12/08/2019 1421    Radiological Exams on Admission: CT HEAD WO CONTRAST  Result Date: 12/03/2021 CLINICAL DATA:  Head trauma. EXAM: CT HEAD WITHOUT CONTRAST TECHNIQUE: Contiguous axial images were obtained from the base of the skull through the vertex without intravenous  contrast. COMPARISON:  CT of the head 03/17/2017 FINDINGS: Brain: No evidence of acute infarction, hemorrhage, hydrocephalus, extra-axial collection or mass lesion/mass effect. There is mild periventricular white matter hypodensity, likely chronic small vessel ischemic change. This has increased compared prior study. Vascular: No hyperdense vessel or unexpected calcification. Skull: Normal. Negative for fracture or focal lesion. Sinuses/Orbits: No acute  finding. There is some mucosal thickening of the right frontal sinus. Other: None. IMPRESSION: No acute intracranial abnormality. Electronically Signed   By: Ronney Asters M.D.   On: 12/03/2021 23:15   DG Chest Port 1 View  Result Date: 12/03/2021 CLINICAL DATA:  Syncope. EXAM: PORTABLE CHEST 1 VIEW COMPARISON:  August 31, 2019 FINDINGS: There is no evidence of acute infiltrate, pleural effusion or pneumothorax. The heart size and mediastinal contours are within normal limits. The visualized skeletal structures are unremarkable. IMPRESSION: No active disease. Electronically Signed   By: Virgina Norfolk M.D.   On: 12/03/2021 22:02    EKG: Independently reviewed.  EKG shows sinus tachycardia with right bundle branch block.  No acute ST elevation or depression.  QTc prolonged at 541  Assessment/Plan Principal Problem:   Symptomatic anemia Ms. Penn-Williams is admitted to medical telemetry floor.  Transfuse 3 units of PRBC. Check Hgb/Hct after transfusion completed.  Pt was hemoccult negative in the ER but states she has been having intermittent blood per rectum for the past 1-2 weeks. Has never had a colonoscopy.  GI consulted to evaluate pt in am. Fall precautions and pt up with assistance only ordered. IVF hydration with LR.   Active Problems:   HTN (hypertension) Continue home medication of verapamil and Dyazide. Monitor BP    Diabetes mellitus type 2 in obese Continue metformin twice a day.  Monitor blood sugars with meals and at  bedtime.  Sliding scale insulin rated as needed for glycemic control. Check Hemoglobin A1c    AKI (acute kidney injury)  IV fluid hydration with LR at 75 ml/hr overnight.  Recheck renal function and electrolytes in morning with labs Most likely secondary to hypoperfusion secondary to profound anemia    Syncope Secondary to profound anemia.        Prolonged QT interval Avoid medications which could further prolong QT interval   DVT prophylaxis: SCDs for DVT prophylaxis.   Code Status:   Full code Family Communication:  Diagnosis and plan discussed with patient.  Patient verbalized understanding agrees with plan.  Questions answered.  Further recommendations to follow as clinical indicated Disposition Plan:   Patient is from:  Home  Anticipated DC to:  Home  Anticipated DC date:  Anticipate 2 midnight or more stay in the hospital  Time spent on admission:      75 minutes   Consults called:  Gastroenterology, secure chat sent by ER physician  Admission status:  Inpatient  Yevonne Aline Kiam Bransfield MD [facility]  How to contact the [facility] Attending or Consulting provider [place] or covering provider during after hours [place], for this patient?   Check the care team in [facility] and look for a) attending/consulting [facility] provider listed and b) the [facility] team listed Log into [email or web address] and use [place]'s universal password to access. If you do not have the password, please contact the hospital operator. Locate the [facility] provider you are looking for under [facility] and page to a number that you can be directly reached. If you still have difficulty reaching the provider, please page the [facility] (Director on Call) for the Hospitalists listed on amion for assistance.  12/04/2021, 1:40 AM

## 2021-12-04 NOTE — ED Notes (Signed)
IV team at bedside 

## 2021-12-04 NOTE — H&P (Signed)
°  Transition of Care (TOC) Screening Note   Patient Details  Name: Frances Cabrera Date of Birth: [birth year]   Transition of Care [facility]) CM/SW Contact:    Harriet Masson, RN Phone Number: 12/04/2021, 4:33 PM    Transition of Care Department [facility]) has reviewed patient and no TOC needs have been identified at this time. We will continue to monitor patient advancement through interdisciplinary progression rounds. If new patient transition needs arise, please place a TOC consult.

## 2021-12-04 NOTE — H&P (View-Only) (Signed)
° °                                                                          [facility] Consult: °9:35 AM °12/04/2021 ° LOS: 0 days  ° ° °Referring Provider: Dr Xu  °Primary Care Physician:  Boyd, Tammy Lamonica, MD °Primary Gastroenterologist:  unassigned ° ° ° °Reason for Consultation:  anemia.  BPR/hematochezia °  °HPI: Frances Cabrera is a 75 y.o. female.  PMH PAF on Eliquis.  NIDDM.  Hypertension.  Hypothyroidism.  Microcytic anemia and FOBT positive in December 2020, however patient was never alerted of these findings and has no knowledge of anemia..  Ultrasound 2002 for elevated LFTs also showed diffuse fatty liver, cholelithiasis, possible pancreatitis.  Cholecystectomy in 2002.   CT scan 11/2019 showed diffuse fatty liver, hiatal hernia, sigmoid diverticulum.  °No prior colonoscopy or EGD.   ° °Experiencing lack of energy, fatigue for some weeks.  More recently getting dizzy when she stands up.  She has had several days of decreased appetite but her weight is stable.  For couple of days experiencing episodes of painless hematochezia but also passing light brown stools.  There are some growling in the stomach but no pain yesterday evening while getting out of bed suffered a syncopal episode.  Reports 3 days of hematuria and abdominal cramping.  Last dose of Eliquis was a.m. 12/20.  Not on aspirin.  Does not use NSAIDs. ° °FOBT negative.  Hgb 4.9, MCV 69.  Previous comp was 2 years ago when Hb was 12.2, MCV 75.  Her platelets and WBCs are normal.  INR 1.6.  Awaiting urinalysis.  LFTs normal.  GFR 64. ° °Vitals with orthostatic blood pressure changes but blood pressure at or above normal.  Heart rate as high as 111.  Room air sats close to 100%. ° °Family history negative for anemia, ulcer disease, Colo/gastrointestinal cancers. ° °Patient does not smoke or drink alcohol.  She is retired and lives  with her husband in [place]. °Patient had been lost to medical care having lived in [place] for the past year or 2 but she is now back in [place]. ° ° ° °Past Medical History:  °Diagnosis Date  ° Diabetes mellitus   ° Elevated LFTs   ° Hypertension   ° Menopause   ° Microalbuminuria   ° Obesity   ° Paroxysmal atrial flutter (HCC)   ° Recurrent UTI   ° post-coital  ° ° °Past Surgical History:  °Procedure Laterality Date  ° CHOLECYSTECTOMY  2000  ° TUBAL LIGATION    ° ° °Prior to Admission medications   °Medication Sig Start Date End Date Taking? Authorizing Provider  °acetaminophen (TYLENOL) 500 MG tablet Take 750 mg by mouth every 6 (six) hours as needed for moderate pain or headache. Reported on 05/31/2016   Yes [provider]  °ELIQUIS 5 MG TABS tablet Take 1 tablet by mouth twice daily °Patient taking differently: Take 5 mg by mouth 2 (two) times daily. 05/24/20  Yes Tolia, Sunit, DO  °gabapentin (NEURONTIN) 300 MG capsule Take 300 mg by mouth 3 (three) times daily. 01/26/20  Yes [provider]  °Ketotifen Fumarate (ALAWAY OP) Place 1 drop into both eyes   2 (two) times daily as needed (itching eyes).   Yes [provider]  °magnesium oxide (MAG-OX) 400 (240 Mg) MG tablet TAKE 1 TABLET THREE TIMES DAILY °Patient taking differently: Take 400 mg by mouth in the morning, at noon, and at bedtime. 06/12/21  Yes Tolia, Sunit, DO  °metFORMIN (GLUCOPHAGE) 500 MG tablet Take 1,000 mg by mouth 2 (two) times daily. 11/08/21  Yes [provider]  °Multiple Vitamins-Minerals (MULTIVITAMIN WITH MINERALS) tablet Take 1 tablet by mouth daily. Centrum Silver   Yes [provider]  °traMADol (ULTRAM) 50 MG tablet Take 50 mg by mouth every 6 (six) hours as needed for moderate pain. 03/31/20  Yes [provider]  °triamterene-hydrochlorothiazide (DYAZIDE) 37.5-25 MG capsule TAKE 1 CAPSULE EVERY DAY °Patient taking differently: Take 1 capsule by mouth daily. 02/12/18  Yes  Jeffery, Chelle, PA  °verapamil (CALAN-SR) 240 MG CR tablet TAKE 1 TABLET TWICE DAILY °Patient taking differently: Take 240 mg by mouth 2 (two) times daily. 02/12/18  Yes Jeffery, Chelle, PA  °Blood Glucose Monitoring Suppl (ACCU-CHEK AVIVA PLUS) w/Device KIT USE AS DIRECTED 01/02/17   Jeffery, Chelle, PA  °Blood Pressure Monitoring (B-D ASSURE BPM/AUTO WRIST CUFF) MISC Use to measure home blood pressure. 09/05/15   Jeffery, Chelle, PA  °EUTHYROX 25 MCG tablet Take 25 mcg by mouth every morning. °Patient not taking: Reported on 12/04/2021 06/19/20   [provider]  °glucose blood test strip Use to test blood sugar once daily. E11.9 02/08/16   Jeffery, Chelle, PA  °Lancet Device MISC Use for home glucose monitoring 02/08/16   Jeffery, Chelle, PA  °metoprolol succinate (TOPROL-XL) 100 MG 24 hr tablet Take 1 tablet (100 mg total) by mouth in the morning. Take with or immediately following a meal. Hold if systolic blood pressure (top blood pressure number) less than 100 mmHg or heart rate less than 60 bpm (pulse). °Patient not taking: Reported on 12/04/2021 08/28/20 01/25/22  Tolia, Sunit, DO  °potassium chloride (KLOR-CON) 10 MEQ tablet Take 10 mEq by mouth daily. °Patient not taking: Reported on 12/04/2021 04/08/20   [provider]  ° ° °Scheduled Meds: ° sodium chloride   Intravenous Once  ° insulin aspart  0-9 Units Subcutaneous TID WC & HS  ° levothyroxine  25 mcg Oral q morning  ° metFORMIN  500 mg Oral BID WC  ° triamterene-hydrochlorothiazide  1 tablet Oral Daily  ° verapamil  240 mg Oral BID  ° °Infusions: ° sodium chloride    ° sodium chloride    ° lactated ringers 75 mL/hr at 12/04/21 0305  ° °PRN Meds: °acetaminophen **OR** acetaminophen ° ° °Allergies as of 12/03/2021 - Review Complete 09/13/2020  °Allergen Reaction Noted  ° Penicillins Hives 05/30/2012  ° Iodinated diagnostic agents Rash 05/09/2016  ° Shrimp [shellfish allergy] Rash 05/09/2016  ° ° °Family History  °Problem Relation Age of Onset   ° Diabetes Mother   ° Hypertension Mother   ° Kidney failure Mother   ° Diabetes Father   ° Diabetes Brother   ° Diabetes Brother   ° ° °Social History  ° °Socioeconomic History  ° Marital status: Married  °  Spouse name: off and on estranged  ° Number of children: 4  ° Years of education: Not on file  ° Highest education level: Not on file  °Occupational History  ° Occupation: unemployed  °Tobacco Use  ° Smoking status: Never  ° Smokeless tobacco: Never  °Vaping Use  ° Vaping Use: Never used  °Substance   and Sexual Activity  ° Alcohol use: No  °  Alcohol/week: 0.0 standard drinks  ° Drug use: No  ° Sexual activity: Not on file  °Other Topics Concern  ° Not on file  °Social History Narrative  ° Not on file  ° °Social Determinants of Health  ° °Financial Resource Strain: Not on file  °Food Insecurity: Not on file  °Transportation Needs: Not on file  °Physical Activity: Not on file  °Stress: Not on file  °Social Connections: Not on file  °Intimate Partner Violence: Not on file  ° ° °REVIEW OF SYSTEMS: °Constitutional: Fatigue, weakness. °ENT:  No nose bleeds °Pulm: Dyspnea on exertion.  No cough.  No dyspnea at rest °CV:  No angina, no LE edema.  Positive racing heart rate with exertion. °GU:  No hematuria, no frequency °GI: See HPI. °Heme: Other than the bleeding per rectum, no other unusual or excessive bleeding/bruising. °Transfusions: No transfusions in the past. °Neuro:  No headaches, no peripheral tingling or numbness.   °Derm:  No itching, no rash or sores.  °Endocrine:  No sweats or chills.  No polyuria or dysuria °Immunization: Reviewed. °Travel:  None beyond local counties in last few months.  ° ° °PHYSICAL EXAM: °Vital signs in last 24 hours: °Vitals:  ° 12/04/21 0844 12/04/21 0916  °BP: (!) 167/72 (!) 163/69  °Pulse: 93 90  °Resp: 20 20  °Temp: 98.1 °F (36.7 °C) 98.1 °F (36.7 °C)  °SpO2: 100% 100%  ° °Wt Readings from Last 3 Encounters:  °12/03/21 104.3 kg  °09/13/20 117.5 kg  °06/14/20 111.9 kg   ° ° °General: Pleasant, somewhat pale but well-appearing and comfortable.  Looks younger than stated age. °Head: No facial asymmetry or swelling.  No signs of head trauma. °Eyes: Conjunctiva pale. °Ears: No hearing deficit. °Nose: No discharge or congestion °Mouth: Tongue midline.  Edentulous.  Mucosa moist, pink, clear. °Neck: No JVD, no masses, no thyromegaly °Lungs: Clear with excellent breath sounds bilaterally.  No cough or labored breathing. °Heart: RRR.  No MRG.  S1, S2 present °Abdomen: Not tender or distended.  Active bowel sounds.  No HSM, masses, bruits, hernias..   °Rectal: Did not repeat rectal exam.  Performed by Dr. Haviland in ED she noted external hemorrhoid and guaiac negative stool °Musc/Skeltl: No joint redness, swelling or gross deformity. °Extremities: No CCE.  Feet are warm °Neurologic: Alert.  Appropriate.  Fully oriented.  Moves all 4 limbs without weakness or tremor °Skin: No rash, no sores, no suspicious lesions, no telangiectasia. °Nodes: No cervical or inguinal adenopathy °Psych: Cooperative, calm, pleasant.  Fluid speech. ° °Intake/Output from previous day: °12/20 0701 - 12/21 0700 °In: 1020 [I.V.:75; Blood:945] °Out: -  °Intake/Output this shift: °Total I/O °In: 315 [Blood:315] °Out: -  ° °LAB RESULTS: °Recent Labs  °  12/03/21 °2330 12/04/21 °0044  °WBC 11.4* 10.1  °HGB 4.1* 4.9*  °HCT 15.1* 17.9*  °PLT 407* 357  ° °BMET °Lab Results  °Component Value Date  ° NA 133 (L) 12/04/2021  ° NA 135 12/03/2021  ° NA 138 05/10/2020  ° K 3.8 12/04/2021  ° K 3.5 12/03/2021  ° K 3.7 05/10/2020  ° CL 102 12/04/2021  ° CL 101 12/03/2021  ° CL 98 05/10/2020  ° CO2 19 (L) 12/04/2021  ° CO2 18 (L) 12/03/2021  ° CO2 22 05/10/2020  ° GLUCOSE 228 (H) 12/04/2021  ° GLUCOSE 246 (H) 12/03/2021  ° GLUCOSE 143 (H) 05/10/2020  ° BUN 14 12/04/2021  ° BUN 14   12/03/2021  ° BUN 16 05/10/2020  ° CREATININE 1.29 (H) 12/04/2021  ° CREATININE 1.34 (H) 12/03/2021  ° CREATININE 1.02 (H) 05/10/2020  ° CALCIUM 8.8 (L)  12/04/2021  ° CALCIUM 9.3 12/03/2021  ° CALCIUM 10.4 (H) 05/10/2020  ° °LFT °Recent Labs  °  12/03/21 °2225  °PROT 7.4  °ALBUMIN 3.7  °AST 41  °ALT 27  °ALKPHOS 37*  °BILITOT 0.8  ° °PT/INR °Lab Results  °Component Value Date  ° INR 1.6 (H) 12/03/2021  ° °Hepatitis Panel °No results for input(s): HEPBSAG, HCVAB, HEPAIGM, HEPBIGM in the last 72 hours. °C-Diff °No components found for: CDIFF °Lipase  °   °Component Value Date/Time  ° LIPASE 21 12/08/2019 1038  ° ° °Drugs of Abuse  °   °Component Value Date/Time  ° LABOPIA NONE DETECTED 11/05/2016 1248  ° COCAINSCRNUR NONE DETECTED 11/05/2016 1248  ° LABBENZ NONE DETECTED 11/05/2016 1248  ° AMPHETMU NONE DETECTED 11/05/2016 1248  ° THCU NONE DETECTED 11/05/2016 1248  ° LABBARB NONE DETECTED 11/05/2016 1248  °  ° °RADIOLOGY STUDIES: °CT HEAD WO CONTRAST ° °Result Date: 12/03/2021 °CLINICAL DATA:  Head trauma. EXAM: CT HEAD WITHOUT CONTRAST TECHNIQUE: Contiguous axial images were obtained from the base of the skull through the vertex without intravenous contrast. COMPARISON:  CT of the head 03/17/2017 FINDINGS: Brain: No evidence of acute infarction, hemorrhage, hydrocephalus, extra-axial collection or mass lesion/mass effect. There is mild periventricular white matter hypodensity, likely chronic small vessel ischemic change. This has increased compared prior study. Vascular: No hyperdense vessel or unexpected calcification. Skull: Normal. Negative for fracture or focal lesion. Sinuses/Orbits: No acute finding. There is some mucosal thickening of the right frontal sinus. Other: None. IMPRESSION: No acute intracranial abnormality. Electronically Signed   By: Amy  Guttmann M.D.   On: 12/03/2021 23:15  ° °DG Chest Port 1 View ° °Result Date: 12/03/2021 °CLINICAL DATA:  Syncope. EXAM: PORTABLE CHEST 1 VIEW COMPARISON:  August 31, 2019 FINDINGS: There is no evidence of acute infiltrate, pleural effusion or pneumothorax. The heart size and mediastinal contours are within  normal limits. The visualized skeletal structures are unremarkable. IMPRESSION: No active disease. Electronically Signed   By: Thaddeus  Houston M.D.   On: 12/03/2021 22:02   ° ° ° °IMPRESSION:  ° °Severe symptomatic microcytic anemia.  She has had microcytic anemia dating to 11/2019 and its gotten worse since then.  In the last few days developed painless hematochezia.  Rule out neoplasia, rule out AVMs, rule out ulcers.  Received 2 PRBCs thus far. ° °NIDDM. ° °   Hypothyroidism.  Not listed as a diagnosis but previously took thyroid replacement hormone ° °  Hx remote LFT elevation, Fatty liver per prior CT and ultrasound.  LFTs currently normal.  Lap chole 2002.  No ETOH.   ° ° ° °PLAN:  °  ° °  EGD, colonoscopy d/w pt she is agreeable.  See prep orders.  Clear liquid diet.  Check TSH.   Await pending H&H, suspect she will need at least another unit of blood.   ° ° °Tennyson Wacha  12/04/2021, 9:35 AM °Phone [phone] ° ° °   °

## 2021-12-04 NOTE — ED Notes (Signed)
MD notified that pt was ortho positive

## 2021-12-04 NOTE — Consult Note (Addendum)
° °                                                                          [facility] Consult: °9:35 AM °12/04/2021 ° LOS: 0 days  ° ° °Referring Provider: Dr Xu  °Primary Care Physician:  Boyd, Tammy Lamonica, MD °Primary Gastroenterologist:  unassigned ° ° ° °Reason for Consultation:  anemia.  BPR/hematochezia °  °HPI: Frances Cabrera is a 75 y.o. female.  PMH PAF on Eliquis.  NIDDM.  Hypertension.  Hypothyroidism.  Microcytic anemia and FOBT positive in December 2020, however patient was never alerted of these findings and has no knowledge of anemia..  Ultrasound 2002 for elevated LFTs also showed diffuse fatty liver, cholelithiasis, possible pancreatitis.  Cholecystectomy in 2002.   CT scan 11/2019 showed diffuse fatty liver, hiatal hernia, sigmoid diverticulum.  °No prior colonoscopy or EGD.   ° °Experiencing lack of energy, fatigue for some weeks.  More recently getting dizzy when she stands up.  She has had several days of decreased appetite but her weight is stable.  For couple of days experiencing episodes of painless hematochezia but also passing light brown stools.  There are some growling in the stomach but no pain yesterday evening while getting out of bed suffered a syncopal episode.  Reports 3 days of hematuria and abdominal cramping.  Last dose of Eliquis was a.m. 12/20.  Not on aspirin.  Does not use NSAIDs. ° °FOBT negative.  Hgb 4.9, MCV 69.  Previous comp was 2 years ago when Hb was 12.2, MCV 75.  Her platelets and WBCs are normal.  INR 1.6.  Awaiting urinalysis.  LFTs normal.  GFR 64. ° °Vitals with orthostatic blood pressure changes but blood pressure at or above normal.  Heart rate as high as 111.  Room air sats close to 100%. ° °Family history negative for anemia, ulcer disease, Colo/gastrointestinal cancers. ° °Patient does not smoke or drink alcohol.  She is retired and lives  with her husband in [place]. °Patient had been lost to medical care having lived in [place] for the past year or 2 but she is now back in [place]. ° ° ° °Past Medical History:  °Diagnosis Date  ° Diabetes mellitus   ° Elevated LFTs   ° Hypertension   ° Menopause   ° Microalbuminuria   ° Obesity   ° Paroxysmal atrial flutter (HCC)   ° Recurrent UTI   ° post-coital  ° ° °Past Surgical History:  °Procedure Laterality Date  ° CHOLECYSTECTOMY  2000  ° TUBAL LIGATION    ° ° °Prior to Admission medications   °Medication Sig Start Date End Date Taking? Authorizing Provider  °acetaminophen (TYLENOL) 500 MG tablet Take 750 mg by mouth every 6 (six) hours as needed for moderate pain or headache. Reported on 05/31/2016   Yes [provider]  °ELIQUIS 5 MG TABS tablet Take 1 tablet by mouth twice daily °Patient taking differently: Take 5 mg by mouth 2 (two) times daily. 05/24/20  Yes Tolia, Sunit, DO  °gabapentin (NEURONTIN) 300 MG capsule Take 300 mg by mouth 3 (three) times daily. 01/26/20  Yes [provider]  °Ketotifen Fumarate (ALAWAY OP) Place 1 drop into both eyes   2 (two) times daily as needed (itching eyes).   Yes [provider]  °magnesium oxide (MAG-OX) 400 (240 Mg) MG tablet TAKE 1 TABLET THREE TIMES DAILY °Patient taking differently: Take 400 mg by mouth in the morning, at noon, and at bedtime. 06/12/21  Yes Tolia, Sunit, DO  °metFORMIN (GLUCOPHAGE) 500 MG tablet Take 1,000 mg by mouth 2 (two) times daily. 11/08/21  Yes [provider]  °Multiple Vitamins-Minerals (MULTIVITAMIN WITH MINERALS) tablet Take 1 tablet by mouth daily. Centrum Silver   Yes [provider]  °traMADol (ULTRAM) 50 MG tablet Take 50 mg by mouth every 6 (six) hours as needed for moderate pain. 03/31/20  Yes [provider]  °triamterene-hydrochlorothiazide (DYAZIDE) 37.5-25 MG capsule TAKE 1 CAPSULE EVERY DAY °Patient taking differently: Take 1 capsule by mouth daily. 02/12/18  Yes  Jeffery, Chelle, PA  °verapamil (CALAN-SR) 240 MG CR tablet TAKE 1 TABLET TWICE DAILY °Patient taking differently: Take 240 mg by mouth 2 (two) times daily. 02/12/18  Yes Jeffery, Chelle, PA  °Blood Glucose Monitoring Suppl (ACCU-CHEK AVIVA PLUS) w/Device KIT USE AS DIRECTED 01/02/17   Jeffery, Chelle, PA  °Blood Pressure Monitoring (B-D ASSURE BPM/AUTO WRIST CUFF) MISC Use to measure home blood pressure. 09/05/15   Jeffery, Chelle, PA  °EUTHYROX 25 MCG tablet Take 25 mcg by mouth every morning. °Patient not taking: Reported on 12/04/2021 06/19/20   [provider]  °glucose blood test strip Use to test blood sugar once daily. E11.9 02/08/16   Jeffery, Chelle, PA  °Lancet Device MISC Use for home glucose monitoring 02/08/16   Jeffery, Chelle, PA  °metoprolol succinate (TOPROL-XL) 100 MG 24 hr tablet Take 1 tablet (100 mg total) by mouth in the morning. Take with or immediately following a meal. Hold if systolic blood pressure (top blood pressure number) less than 100 mmHg or heart rate less than 60 bpm (pulse). °Patient not taking: Reported on 12/04/2021 08/28/20 01/25/22  Tolia, Sunit, DO  °potassium chloride (KLOR-CON) 10 MEQ tablet Take 10 mEq by mouth daily. °Patient not taking: Reported on 12/04/2021 04/08/20   [provider]  ° ° °Scheduled Meds: ° sodium chloride   Intravenous Once  ° insulin aspart  0-9 Units Subcutaneous TID WC & HS  ° levothyroxine  25 mcg Oral q morning  ° metFORMIN  500 mg Oral BID WC  ° triamterene-hydrochlorothiazide  1 tablet Oral Daily  ° verapamil  240 mg Oral BID  ° °Infusions: ° sodium chloride    ° sodium chloride    ° lactated ringers 75 mL/hr at 12/04/21 0305  ° °PRN Meds: °acetaminophen **OR** acetaminophen ° ° °Allergies as of 12/03/2021 - Review Complete 09/13/2020  °Allergen Reaction Noted  ° Penicillins Hives 05/30/2012  ° Iodinated diagnostic agents Rash 05/09/2016  ° Shrimp [shellfish allergy] Rash 05/09/2016  ° ° °Family History  °Problem Relation Age of Onset   ° Diabetes Mother   ° Hypertension Mother   ° Kidney failure Mother   ° Diabetes Father   ° Diabetes Brother   ° Diabetes Brother   ° ° °Social History  ° °Socioeconomic History  ° Marital status: Married  °  Spouse name: off and on estranged  ° Number of children: 4  ° Years of education: Not on file  ° Highest education level: Not on file  °Occupational History  ° Occupation: unemployed  °Tobacco Use  ° Smoking status: Never  ° Smokeless tobacco: Never  °Vaping Use  ° Vaping Use: Never used  °Substance   and Sexual Activity  ° Alcohol use: No  °  Alcohol/week: 0.0 standard drinks  ° Drug use: No  ° Sexual activity: Not on file  °Other Topics Concern  ° Not on file  °Social History Narrative  ° Not on file  ° °Social Determinants of Health  ° °Financial Resource Strain: Not on file  °Food Insecurity: Not on file  °Transportation Needs: Not on file  °Physical Activity: Not on file  °Stress: Not on file  °Social Connections: Not on file  °Intimate Partner Violence: Not on file  ° ° °REVIEW OF SYSTEMS: °Constitutional: Fatigue, weakness. °ENT:  No nose bleeds °Pulm: Dyspnea on exertion.  No cough.  No dyspnea at rest °CV:  No angina, no LE edema.  Positive racing heart rate with exertion. °GU:  No hematuria, no frequency °GI: See HPI. °Heme: Other than the bleeding per rectum, no other unusual or excessive bleeding/bruising. °Transfusions: No transfusions in the past. °Neuro:  No headaches, no peripheral tingling or numbness.   °Derm:  No itching, no rash or sores.  °Endocrine:  No sweats or chills.  No polyuria or dysuria °Immunization: Reviewed. °Travel:  None beyond local counties in last few months.  ° ° °PHYSICAL EXAM: °Vital signs in last 24 hours: °Vitals:  ° 12/04/21 0844 12/04/21 0916  °BP: (!) 167/72 (!) 163/69  °Pulse: 93 90  °Resp: 20 20  °Temp: 98.1 °F (36.7 °C) 98.1 °F (36.7 °C)  °SpO2: 100% 100%  ° °Wt Readings from Last 3 Encounters:  °12/03/21 104.3 kg  °09/13/20 117.5 kg  °06/14/20 111.9 kg   ° ° °General: Pleasant, somewhat pale but well-appearing and comfortable.  Looks younger than stated age. °Head: No facial asymmetry or swelling.  No signs of head trauma. °Eyes: Conjunctiva pale. °Ears: No hearing deficit. °Nose: No discharge or congestion °Mouth: Tongue midline.  Edentulous.  Mucosa moist, pink, clear. °Neck: No JVD, no masses, no thyromegaly °Lungs: Clear with excellent breath sounds bilaterally.  No cough or labored breathing. °Heart: RRR.  No MRG.  S1, S2 present °Abdomen: Not tender or distended.  Active bowel sounds.  No HSM, masses, bruits, hernias..   °Rectal: Did not repeat rectal exam.  Performed by Dr. Haviland in ED she noted external hemorrhoid and guaiac negative stool °Musc/Skeltl: No joint redness, swelling or gross deformity. °Extremities: No CCE.  Feet are warm °Neurologic: Alert.  Appropriate.  Fully oriented.  Moves all 4 limbs without weakness or tremor °Skin: No rash, no sores, no suspicious lesions, no telangiectasia. °Nodes: No cervical or inguinal adenopathy °Psych: Cooperative, calm, pleasant.  Fluid speech. ° °Intake/Output from previous day: °12/20 0701 - 12/21 0700 °In: 1020 [I.V.:75; Blood:945] °Out: -  °Intake/Output this shift: °Total I/O °In: 315 [Blood:315] °Out: -  ° °LAB RESULTS: °Recent Labs  °  12/03/21 °2330 12/04/21 °0044  °WBC 11.4* 10.1  °HGB 4.1* 4.9*  °HCT 15.1* 17.9*  °PLT 407* 357  ° °BMET °Lab Results  °Component Value Date  ° NA 133 (L) 12/04/2021  ° NA 135 12/03/2021  ° NA 138 05/10/2020  ° K 3.8 12/04/2021  ° K 3.5 12/03/2021  ° K 3.7 05/10/2020  ° CL 102 12/04/2021  ° CL 101 12/03/2021  ° CL 98 05/10/2020  ° CO2 19 (L) 12/04/2021  ° CO2 18 (L) 12/03/2021  ° CO2 22 05/10/2020  ° GLUCOSE 228 (H) 12/04/2021  ° GLUCOSE 246 (H) 12/03/2021  ° GLUCOSE 143 (H) 05/10/2020  ° BUN 14 12/04/2021  ° BUN 14   12/03/2021  ° BUN 16 05/10/2020  ° CREATININE 1.29 (H) 12/04/2021  ° CREATININE 1.34 (H) 12/03/2021  ° CREATININE 1.02 (H) 05/10/2020  ° CALCIUM 8.8 (L)  12/04/2021  ° CALCIUM 9.3 12/03/2021  ° CALCIUM 10.4 (H) 05/10/2020  ° °LFT °Recent Labs  °  12/03/21 °2225  °PROT 7.4  °ALBUMIN 3.7  °AST 41  °ALT 27  °ALKPHOS 37*  °BILITOT 0.8  ° °PT/INR °Lab Results  °Component Value Date  ° INR 1.6 (H) 12/03/2021  ° °Hepatitis Panel °No results for input(s): HEPBSAG, HCVAB, HEPAIGM, HEPBIGM in the last 72 hours. °C-Diff °No components found for: CDIFF °Lipase  °   °Component Value Date/Time  ° LIPASE 21 12/08/2019 1038  ° ° °Drugs of Abuse  °   °Component Value Date/Time  ° LABOPIA NONE DETECTED 11/05/2016 1248  ° COCAINSCRNUR NONE DETECTED 11/05/2016 1248  ° LABBENZ NONE DETECTED 11/05/2016 1248  ° AMPHETMU NONE DETECTED 11/05/2016 1248  ° THCU NONE DETECTED 11/05/2016 1248  ° LABBARB NONE DETECTED 11/05/2016 1248  °  ° °RADIOLOGY STUDIES: °CT HEAD WO CONTRAST ° °Result Date: 12/03/2021 °CLINICAL DATA:  Head trauma. EXAM: CT HEAD WITHOUT CONTRAST TECHNIQUE: Contiguous axial images were obtained from the base of the skull through the vertex without intravenous contrast. COMPARISON:  CT of the head 03/17/2017 FINDINGS: Brain: No evidence of acute infarction, hemorrhage, hydrocephalus, extra-axial collection or mass lesion/mass effect. There is mild periventricular white matter hypodensity, likely chronic small vessel ischemic change. This has increased compared prior study. Vascular: No hyperdense vessel or unexpected calcification. Skull: Normal. Negative for fracture or focal lesion. Sinuses/Orbits: No acute finding. There is some mucosal thickening of the right frontal sinus. Other: None. IMPRESSION: No acute intracranial abnormality. Electronically Signed   By: Amy  Guttmann M.D.   On: 12/03/2021 23:15  ° °DG Chest Port 1 View ° °Result Date: 12/03/2021 °CLINICAL DATA:  Syncope. EXAM: PORTABLE CHEST 1 VIEW COMPARISON:  August 31, 2019 FINDINGS: There is no evidence of acute infiltrate, pleural effusion or pneumothorax. The heart size and mediastinal contours are within  normal limits. The visualized skeletal structures are unremarkable. IMPRESSION: No active disease. Electronically Signed   By: Thaddeus  Houston M.D.   On: 12/03/2021 22:02   ° ° ° °IMPRESSION:  ° °Severe symptomatic microcytic anemia.  She has had microcytic anemia dating to 11/2019 and its gotten worse since then.  In the last few days developed painless hematochezia.  Rule out neoplasia, rule out AVMs, rule out ulcers.  Received 2 PRBCs thus far. ° °NIDDM. ° °   Hypothyroidism.  Not listed as a diagnosis but previously took thyroid replacement hormone ° °  Hx remote LFT elevation, Fatty liver per prior CT and ultrasound.  LFTs currently normal.  Lap chole 2002.  No ETOH.   ° ° ° °PLAN:  °  ° °  EGD, colonoscopy d/w pt she is agreeable.  See prep orders.  Clear liquid diet.  Check TSH.   Await pending H&H, suspect she will need at least another unit of blood.   ° ° °   12/04/2021, 9:35 AM °Phone [phone] ° ° °   °

## 2021-12-04 NOTE — ED Notes (Signed)
Signed blood consent at bedside  

## 2021-12-05 ENCOUNTER — Inpatient Hospital Stay (HOSPITAL_COMMUNITY): Payer: Medicare HMO | Admitting: Anesthesiology

## 2021-12-05 ENCOUNTER — Encounter (HOSPITAL_COMMUNITY): Payer: Self-pay | Admitting: Family Medicine

## 2021-12-05 ENCOUNTER — Encounter (HOSPITAL_COMMUNITY)
Admission: EM | Disposition: A | Discharge status: Home / Self Care | Payer: Self-pay | Source: Home / Self Care | Attending: Internal Medicine

## 2021-12-05 ENCOUNTER — Inpatient Hospital Stay (HOSPITAL_COMMUNITY): Payer: Medicare HMO

## 2021-12-05 DIAGNOSIS — K623 Rectal prolapse: Secondary | ICD-10-CM

## 2021-12-05 DIAGNOSIS — K2970 Gastritis, unspecified, without bleeding: Secondary | ICD-10-CM

## 2021-12-05 DIAGNOSIS — K2980 Duodenitis without bleeding: Secondary | ICD-10-CM

## 2021-12-05 HISTORY — PX: COLONOSCOPY WITH PROPOFOL: SHX5780

## 2021-12-05 HISTORY — PX: ESOPHAGOGASTRODUODENOSCOPY (EGD) WITH PROPOFOL: SHX5813

## 2021-12-05 HISTORY — PX: BIOPSY: SHX5522

## 2021-12-05 LAB — BASIC METABOLIC PANEL
Anion gap: 14 (ref 5–15)
BUN: 9 mg/dL (ref 8–23)
CO2: 18 mmol/L — ABNORMAL LOW (ref 22–32)
Calcium: 9.2 mg/dL (ref 8.9–10.3)
Chloride: 104 mmol/L (ref 98–111)
Creatinine, Ser: 1.2 mg/dL — ABNORMAL HIGH (ref 0.44–1.00)
GFR, Estimated: 48 mL/min — ABNORMAL LOW (ref >60)
Glucose, Bld: 194 mg/dL — ABNORMAL HIGH (ref 70–99)
Potassium: 3.3 mmol/L — ABNORMAL LOW (ref 3.5–5.1)
Sodium: 136 mmol/L (ref 135–145)

## 2021-12-05 LAB — CBC
HCT: 26 % — ABNORMAL LOW (ref 36.0–46.0)
Hemoglobin: 8.6 g/dL — ABNORMAL LOW (ref 12.0–15.0)
MCH: 25.1 pg — ABNORMAL LOW (ref 26.0–34.0)
MCHC: 33.1 g/dL (ref 30.0–36.0)
MCV: 75.8 fL — ABNORMAL LOW (ref 80.0–100.0)
Platelets: 360 10*3/uL (ref 150–400)
RBC: 3.43 MIL/uL — ABNORMAL LOW (ref 3.87–5.11)
RDW: 21.1 % — ABNORMAL HIGH (ref 11.5–15.5)
WBC: 11.9 10*3/uL — ABNORMAL HIGH (ref 4.0–10.5)
nRBC: 0.5 % — ABNORMAL HIGH (ref 0.0–0.2)

## 2021-12-05 LAB — URINALYSIS, ROUTINE W REFLEX MICROSCOPIC
Bilirubin Urine: NEGATIVE
Glucose, UA: NEGATIVE mg/dL
Hgb urine dipstick: NEGATIVE
Ketones, ur: 5 mg/dL — AB
Leukocytes,Ua: NEGATIVE
Nitrite: NEGATIVE
Protein, ur: NEGATIVE mg/dL
Specific Gravity, Urine: 1.015 (ref 1.005–1.030)
pH: 6 (ref 5.0–8.0)

## 2021-12-05 LAB — URIC ACID: Uric Acid, Serum: 10.4 mg/dL — ABNORMAL HIGH (ref 2.5–7.1)

## 2021-12-05 LAB — MAGNESIUM: Magnesium: 2 mg/dL (ref 1.7–2.4)

## 2021-12-05 LAB — GLUCOSE, CAPILLARY
Glucose-Capillary: 153 mg/dL — ABNORMAL HIGH (ref 70–99)
Glucose-Capillary: 147 mg/dL — ABNORMAL HIGH (ref 70–99)
Glucose-Capillary: 177 mg/dL — ABNORMAL HIGH (ref 70–99)

## 2021-12-05 LAB — HEMOGLOBIN A1C
Hgb A1c MFr Bld: 6.4 % — ABNORMAL HIGH (ref 4.8–5.6)
Hgb A1c MFr Bld: 8.3 % — ABNORMAL HIGH (ref 4.8–5.6)
Mean Plasma Glucose: 136.98 mg/dL
Mean Plasma Glucose: 192 mg/dL

## 2021-12-05 SURGERY — COLONOSCOPY WITH PROPOFOL
Anesthesia: Monitor Anesthesia Care

## 2021-12-05 MED ORDER — PROPOFOL 10 MG/ML IV BOLUS
INTRAVENOUS | Status: DC | PRN
  Administered 2021-12-05 (×6): 20 mg via INTRAVENOUS

## 2021-12-05 MED ORDER — LIDOCAINE 2% (20 MG/ML) 5 ML SYRINGE
INTRAMUSCULAR | Status: DC | PRN
  Administered 2021-12-05: 60 mg via INTRAVENOUS

## 2021-12-05 MED ORDER — PROPOFOL 500 MG/50ML IV EMUL
INTRAVENOUS | Status: DC | PRN
  Administered 2021-12-05: 100 ug/kg/min via INTRAVENOUS

## 2021-12-05 MED ORDER — PANTOPRAZOLE SODIUM 40 MG PO TBEC
40.0000 mg | DELAYED_RELEASE_TABLET | Freq: Two times a day (BID) | ORAL | Status: DC
  Administered 2021-12-05 – 2021-12-07 (×4): 40 mg via ORAL
  Filled 2021-12-05 (×4): qty 1

## 2021-12-05 MED ORDER — METOPROLOL TARTRATE 5 MG/5ML IV SOLN: 2.5000 mg | Freq: Four times a day (QID) | INTRAVENOUS | Status: DC | PRN

## 2021-12-05 MED ORDER — HYDROCORTISONE ACETATE 25 MG RE SUPP
25.0000 mg | Freq: Every day | RECTAL | Status: DC
  Administered 2021-12-05 – 2021-12-06 (×2): 25 mg via RECTAL
  Filled 2021-12-05 (×2): qty 1

## 2021-12-05 MED ORDER — HYDROCORTISONE 1 % EX CREA
TOPICAL_CREAM | Freq: Two times a day (BID) | CUTANEOUS | Status: DC
  Filled 2021-12-05: qty 28

## 2021-12-05 MED ORDER — SODIUM CHLORIDE 0.9 % IV SOLN
125.0000 mg | Freq: Once | INTRAVENOUS | Status: AC
  Administered 2021-12-06: 06:00:00 125 mg via INTRAVENOUS
  Filled 2021-12-05: qty 10

## 2021-12-05 MED ORDER — METOPROLOL SUCCINATE ER 100 MG PO TB24
100.0000 mg | ORAL_TABLET | Freq: Every morning | ORAL | Status: DC
  Administered 2021-12-06 – 2021-12-07 (×2): 100 mg via ORAL
  Filled 2021-12-05 (×2): qty 1

## 2021-12-05 MED ORDER — LIP MEDEX EX OINT
1.0000 "application " | TOPICAL_OINTMENT | CUTANEOUS | Status: DC | PRN
  Administered 2021-12-05: 1 via TOPICAL
  Filled 2021-12-05: qty 7

## 2021-12-05 MED ORDER — POTASSIUM CHLORIDE CRYS ER 20 MEQ PO TBCR
40.0000 meq | EXTENDED_RELEASE_TABLET | Freq: Two times a day (BID) | ORAL | Status: AC
  Administered 2021-12-05 (×2): 40 meq via ORAL
  Filled 2021-12-05 (×2): qty 2

## 2021-12-05 SURGICAL SUPPLY — 25 items

## 2021-12-05 NOTE — Progress Notes (Signed)
PROGRESS NOTE    Frances Cabrera  [id number] DOB: Oct 24, 1946 DOA: 12/03/2021 PCP: Bartholome Bill, MD    Chief Complaint  Patient presents with   Loss of Consciousness   Hematuria    Brief Narrative:  Frances Cabrera is a 75 y.o. female with medical history significant for HTN, DMT2, PAF on eliquis who presents for evaluation of syncope.  She reports that for the last week she has had decreased energy level and she has been very tired for the last 3 days.  She reports when she has been standing up she gets dizzy and today she went to the bathroom and remembers turning on the water in the sink and then remembers waking up on the floor.  She is not sure how long she was on the ground.  Found to have anemia, hgb 4  Subjective:   She is seen after returned from endoscopy, she denies pain , no active bleeding   Assessment & Plan:   Principal Problem:   Symptomatic anemia Active Problems:   Diabetes mellitus type 2 in obese (HCC)   HTN (hypertension)   Syncope   AKI (acute kidney injury) (HCC)   Prolonged QT interval  Acute blood loss anemia/iron deficiency anemia iron / GI bleed -Hemoglobin was 4 on presentation, baseline 12 -Status post 3 units PRBC transfusion, Hgb 8.6 today -Status post EGD and colonoscopy, details please refer to original reports -Continue PPI, CT abdomen/pel ordered by GI, may need capsule study pending CT findings -IV iron, need to discharge on oral IR - follow GI recommendation  Intermittent rectal prolapse/hemorrhoids General surgery consulted   Non-insulin-dependent type 2 diabetes  HTN: Discontinue verapamil as this could lead to constipation Restart betablocker, she states she run out this meds Will discuss with patient regarding acei/arb for renal protection in diabetes  PAF on eliquis Currently sinus rhythm Continue betablocker, eliquis held, resume when ok with gi F/u with cardiology Dr  Terri Skains  Hypokalemia/hypomagnesemia Replace, recheck  AKI 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 progressive CKD, now CKD 3 A Creatinine 0.85 in 2020, Creatinine range from 1-1.34 here in the hospital UA ordered, pending collection Renal dosing meds  H/o Gout Report some foot pain, does not have significant edema, no erythema Foot x-ray no acute findings Check uric acid Will d/c diuretics    Body mass index is 32.07 kg/m.Marland Kitchen        Unresulted Labs (From admission, onward)     Start     Ordered   12/06/21 0500  CBC with Differential/Platelet  Tomorrow morning,   R       Question:  Specimen collection method  Answer:  Lab=Lab collect   12/05/21 1419   12/06/21 0500  Magnesium  Tomorrow morning,   STAT       Question:  Specimen collection method  Answer:  Lab=Lab collect   12/05/21 1419   12/06/21 [id number]  Basic metabolic panel  Daily,   R     Question:  Specimen collection method  Answer:  Lab=Lab collect   12/05/21 1427   12/05/21 1419  Urinalysis, Routine w reflex microscopic  Once,   R        12/05/21 1418   12/03/21 2144  Urinalysis, Routine w reflex microscopic  ONCE - STAT,   STAT        12/03/21 2144              DVT prophylaxis: SCDs Start: 12/04/21 0155   Code Status: full Family Communication: patient  Disposition:   Status is: Inpatient   Dispo: The patient is from: Home              Anticipated d/c is to: Home              Anticipated d/c date is: 24 to 48 hours, need to resume anticoagulation if okay with GI , need hemoglobin stability , needs GI clearance                Consultants:  GI General surgery  Procedures:  EGD/colonoscopy PRBC transfusion  Antimicrobials:   Anti-infectives (From admission, onward)    None           Objective: Vitals:   12/05/21 1332 12/05/21 1340 12/05/21 1424 12/05/21 1605  BP: (!) 153/52 (!) 152/58 (!) 151/62 (!) 152/73  Pulse: 81 81 79 85  Resp: 14 16 16 14   Temp:    (!) 97.3 F (36.3 C)  TempSrc:    Oral  SpO2:  100% 100% 100% 100%  Weight:      Height:        Intake/Output Summary (Last 24 hours) at 12/05/2021 1631 Last data filed at 12/05/2021 1322 Gross per 24 hour  Intake 400 ml  Output --  Net 400 ml   Filed Weights   12/03/21 2123 12/05/21 1210  Weight: 104.3 kg 104.3 kg    Examination:  General exam: alert, awake, communicative,calm, NAD Respiratory system: Clear to auscultation. Respiratory effort normal. Cardiovascular system:  RRR.  Gastrointestinal system: Abdomen is nondistended, soft and nontender.  Normal bowel sounds heard. Central nervous system: Alert and oriented. No focal neurological deficits. Extremities:  no edema Skin: No rashes, lesions or ulcers Psychiatry: Judgement and insight appear normal. Mood & affect appropriate.     Data Reviewed: I have personally reviewed following labs and imaging studies  CBC: Recent Labs  Lab 12/03/21 2330 12/04/21 0044 12/04/21 1701 12/05/21 0454  WBC 11.4* 10.1 9.2 11.9*  NEUTROABS 9.0*  --   --   --   HGB 4.1* 4.9* 7.8* 8.6*  HCT 15.1* 17.9* 23.5* 26.0*  MCV 70.6* 69.4* 75.3* 75.8*  PLT 407* 357 327 360    Basic Metabolic Panel: Recent Labs  Lab 12/03/21 2225 12/04/21 0044 12/05/21 0454  NA 135 133* 136  K 3.5 3.8 3.3*  CL 101 102 104  CO2 18* 19* 18*  GLUCOSE 246* 228* 194*  BUN 14 14 9   CREATININE 1.34* 1.29* 1.20*  CALCIUM 9.3 8.8* 9.2  MG  --   --  2.0    GFR: Estimated Creatinine Clearance: 55.5 mL/min (A) (by C-G formula based on SCr of 1.2 mg/dL (H)).  Liver Function Tests: Recent Labs  Lab 12/03/21 2225  AST 41  ALT 27  ALKPHOS 37*  BILITOT 0.8  PROT 7.4  ALBUMIN 3.7    CBG: Recent Labs  Lab 12/03/21 2211 12/04/21 0802 12/04/21 1231 12/05/21 1426 12/05/21 1606  GLUCAP 239* 177* 159* 153* 147*     Recent Results (from the past 240 hour(s))  Resp Panel by RT-PCR (Flu A&B, Covid) Nasopharyngeal Swab     Status: None   Collection Time: 12/03/21  9:45 PM   Specimen:  Nasopharyngeal Swab; Nasopharyngeal(NP) swabs in vial transport medium  Result Value Ref Range Status   SARS Coronavirus 2 by RT PCR NEGATIVE NEGATIVE Final    Comment: (NOTE) SARS-CoV-2 target nucleic acids are NOT DETECTED.  The SARS-CoV-2 RNA is generally detectable in upper respiratory specimens during the acute  phase of infection. The lowest concentration of SARS-CoV-2 viral copies this assay can detect is 138 copies/mL. A negative result does not preclude SARS-Cov-2 infection and should not be used as the sole basis for treatment or other patient management decisions. A negative result may occur with  improper specimen collection/handling, submission of specimen other than nasopharyngeal swab, presence of viral mutation(s) within the areas targeted by this assay, and inadequate number of viral copies(<138 copies/mL). A negative result must be combined with clinical observations, patient history, and epidemiological information. The expected result is Negative.  Fact Sheet for Patients:  BloggerCourse.comhttps://www.fda.gov/media/152166/download  Fact Sheet for Healthcare Providers:  SeriousBroker.ithttps://www.fda.gov/media/152162/download  This test is no t yet approved or cleared by the [place] FDA and  has been authorized for detection and/or diagnosis of SARS-CoV-2 by FDA under an Emergency Use Authorization (EUA). This EUA will remain  in effect (meaning this test can be used) for the duration of the COVID-19 declaration under Section 564(b)(1) of the Act, 21 U.S.C.section 360bbb-3(b)(1), unless the authorization is terminated  or revoked sooner.       Influenza A by PCR NEGATIVE NEGATIVE Final   Influenza B by PCR NEGATIVE NEGATIVE Final    Comment: (NOTE) The Xpert Xpress SARS-CoV-2/FLU/RSV plus assay is intended as an aid in the diagnosis of influenza from Nasopharyngeal swab specimens and should not be used as a sole basis for treatment. Nasal washings and aspirates are unacceptable for  Xpert Xpress SARS-CoV-2/FLU/RSV testing.  Fact Sheet for Patients: BloggerCourse.comhttps://www.fda.gov/media/152166/download  Fact Sheet for Healthcare Providers: SeriousBroker.ithttps://www.fda.gov/media/152162/download  This test is not yet approved or cleared by the [place] FDA and has been authorized for detection and/or diagnosis of SARS-CoV-2 by FDA under an Emergency Use Authorization (EUA). This EUA will remain in effect (meaning this test can be used) for the duration of the COVID-19 declaration under Section 564(b)(1) of the Act, 21 U.S.C. section 360bbb-3(b)(1), unless the authorization is terminated or revoked.  Performed at [facility], [address] [place], [place] [address]          Radiology Studies: CT HEAD WO CONTRAST  Result Date: 12/03/2021 CLINICAL DATA:  Head trauma. EXAM: CT HEAD WITHOUT CONTRAST TECHNIQUE: Contiguous axial images were obtained from the base of the skull through the vertex without intravenous contrast. COMPARISON:  CT of the head 03/17/2017 FINDINGS: Brain: No evidence of acute infarction, hemorrhage, hydrocephalus, extra-axial collection or mass lesion/mass effect. There is mild periventricular white matter hypodensity, likely chronic small vessel ischemic change. This has increased compared prior study. Vascular: No hyperdense vessel or unexpected calcification. Skull: Normal. Negative for fracture or focal lesion. Sinuses/Orbits: No acute finding. There is some mucosal thickening of the right frontal sinus. Other: None. IMPRESSION: No acute intracranial abnormality. Electronically Signed   By: Darliss CheneyAmy  Guttmann M.D.   On: 12/03/2021 23:15   DG Chest Port 1 View  Result Date: 12/03/2021 CLINICAL DATA:  Syncope. EXAM: PORTABLE CHEST 1 VIEW COMPARISON:  August 31, 2019 FINDINGS: There is no evidence of acute infiltrate, pleural effusion or pneumothorax. The heart size and mediastinal contours are within normal limits. The visualized skeletal structures are  unremarkable. IMPRESSION: No active disease. Electronically Signed   By: Aram Candelahaddeus  Houston M.D.   On: 12/03/2021 22:02   DG Foot 2 Views Left  Result Date: 12/04/2021 CLINICAL DATA:  Left foot pain EXAM: LEFT FOOT - 2 VIEW COMPARISON:  None. FINDINGS: There is no evidence of fracture or dislocation. Diffuse demineralization. There is no evidence of arthropathy  or other focal bone abnormality. Soft tissues are unremarkable. IMPRESSION: No acute osseous abnormality. Electronically Signed   By: Yetta Glassman M.D.   On: 12/04/2021 15:38   DG Foot 2 Views Right  Result Date: 12/04/2021 CLINICAL DATA:  Bilateral foot pain. EXAM: RIGHT FOOT - 2 VIEW COMPARISON:  Contralateral foot of the same date. FINDINGS: Osteopenia and degenerative changes. Signs of Achilles and plantar enthesopathy. Mild hallux valgus. No signs of fracture or dislocation. No substantial soft tissue swelling. IMPRESSION: Osteopenia and degenerative changes. No signs of acute fracture or dislocation. Electronically Signed   By: Zetta Bills M.D.   On: 12/04/2021 15:38        Scheduled Meds:  sodium chloride   Intravenous Once   gabapentin  300 mg Oral BID   hydrocortisone  25 mg Rectal QHS   hydrocortisone cream   Topical BID   insulin aspart  0-9 Units Subcutaneous TID WC & HS   levothyroxine  25 mcg Oral q morning   metoCLOPramide (REGLAN) injection  10 mg Intravenous Once   metoprolol succinate  100 mg Oral q AM   pantoprazole  40 mg Oral BID AC   potassium chloride  40 mEq Oral BID   Continuous Infusions:  sodium chloride     sodium chloride     [START ON 12/06/2021] ferric gluconate (FERRLECIT) IVPB       LOS: 1 day   Time spent: 15mins Greater than 50% of this time was spent in counseling, explanation of diagnosis, planning of further management, and coordination of care.   Voice Recognition Viviann Spare dictation system was used to create this note, attempts have been made to correct errors. Please contact  the author with questions and/or clarifications.   Florencia Reasons, MD PhD FACP [facility]  Available via Epic secure chat 7am-7pm for nonurgent issues Please page for urgent issues To page the attending provider between 7A-7P or the covering provider during after hours 7P-7A, please log into the web site [email or web address] and access using universal Glide password for that web site. If you do not have the password, please call the hospital operator.    12/05/2021, 4:31 PM

## 2021-12-05 NOTE — Transfer of Care (Signed)
Immediate Anesthesia Transfer of Care Note  Patient: Frances Cabrera  Procedure(s) Performed: COLONOSCOPY WITH PROPOFOL ESOPHAGOGASTRODUODENOSCOPY (EGD) WITH PROPOFOL BIOPSY  Patient Location: PACU and Endoscopy Unit  Anesthesia Type:MAC  Level of Consciousness: drowsy and patient cooperative  Airway & Oxygen Therapy: Patient Spontanous Breathing and Patient connected to nasal cannula oxygen  Post-op Assessment: Report given to RN and Post -op Vital signs reviewed and stable  Post vital signs: Reviewed and stable  Last Vitals:  Vitals Value Taken Time  BP    Temp    Pulse    Resp    SpO2      Last Pain:  Vitals:   12/05/21 1210  TempSrc: Temporal  PainSc: 0-No pain         Complications: No notable events documented.

## 2021-12-05 NOTE — Anesthesia Preprocedure Evaluation (Addendum)
Anesthesia Evaluation  Patient identified by MRN, date of birth, ID band Patient awake    Reviewed: Allergy & Precautions, NPO status , Patient's Chart, lab work & pertinent test results  Airway Mallampati: II  TM Distance: >3 FB Neck ROM: Full    Dental  (+) Dental Advisory Given   Pulmonary neg pulmonary ROS,    Pulmonary exam normal breath sounds clear to auscultation       Cardiovascular hypertension, Pt. on medications Normal cardiovascular exam Rhythm:Regular Rate:Normal     Neuro/Psych negative neurological ROS     GI/Hepatic negative GI ROS, Neg liver ROS,   Endo/Other  negative endocrine ROSdiabetes  Renal/GU Renal disease     Musculoskeletal negative musculoskeletal ROS (+)   Abdominal (+) + obese,   Peds  Hematology  (+) Blood dyscrasia, anemia ,   Anesthesia Other Findings   Reproductive/Obstetrics                            Anesthesia Physical Anesthesia Plan  ASA: 3  Anesthesia Plan: MAC   Post-op Pain Management:    Induction: Intravenous  PONV Risk Score and Plan: 2 and Ondansetron, Propofol infusion, Treatment may vary due to age or medical condition and TIVA  Airway Management Planned: Natural Airway  Additional Equipment: None  Intra-op Plan:   Post-operative Plan:   Informed Consent: I have reviewed the patients History and Physical, chart, labs and discussed the procedure including the risks, benefits and alternatives for the proposed anesthesia with the patient or authorized representative who has indicated his/her understanding and acceptance.     Dental advisory given  Plan Discussed with: CRNA  Anesthesia Plan Comments:       Anesthesia Quick Evaluation

## 2021-12-05 NOTE — Op Note (Signed)
[facility] Patient Name: Frances Cabrera Procedure Date : 12/05/2021 MRN: [id number] Attending MD: Corliss Parish , MD Date of Birth: Feb 05, 1946 CSN: [id number] Age: 75 Admit Type: Inpatient Procedure:                Upper GI endoscopy Indications:              Iron deficiency anemia Providers:                Corliss Parish, MD, Vicki Mallet, RN, Rozetta Nunnery, Technician, Albertina Senegal. Beckner, CRNA Referring MD:             [facility] Medicines:                Monitored Anesthesia Care Complications:            No immediate complications. Estimated Blood Loss:     Estimated blood loss was minimal. Procedure:                Pre-Anesthesia Assessment:                           - Prior to the procedure, a History and Physical                            was performed, and patient medications and                            allergies were reviewed. The patient's tolerance of                            previous anesthesia was also reviewed. The risks                            and benefits of the procedure and the sedation                            options and risks were discussed with the patient.                            All questions were answered, and informed consent                            was obtained. Prior Anticoagulants: The patient has                            taken Eliquis (apixaban), last dose was 2 days                            prior to procedure. ASA Grade Assessment: III - A                            patient with severe systemic disease. After  reviewing the risks and benefits, the patient was                            deemed in satisfactory condition to undergo the                            procedure.                           After obtaining informed consent, the endoscope was                            passed under direct vision. Throughout the                             procedure, the patient's blood pressure, pulse, and                            oxygen saturations were monitored continuously. The                            [id number] Olympus peds colonoscope was                            introduced through the mouth, and advanced to the                            second part of duodenum. The upper GI endoscopy was                            accomplished without difficulty. The patient                            tolerated the procedure. Scope In: Scope Out: Findings:      No gross lesions were noted in the proximal esophagus and in the mid       esophagus.      LA Grade A (one or more mucosal breaks less than 5 mm, not extending       between tops of 2 mucosal folds) esophagitis with no bleeding was found       in the distal esophagus.      The Z-line was regular and was found 40 cm from the incisors.      A 3 cm hiatal hernia was present.      Patchy moderate inflammation characterized by erosions, erythema,       friability and granularity was found in the entire examined stomach.       Biopsies were taken with a cold forceps for histology and Helicobacter       pylori testing.      Patchy moderate inflammation characterized by erosions and granularity       was found in the duodenal bulb, in the first portion of the duodenum and       in the second portion of the duodenum. Biopsies were taken with a cold       forceps for histology. Impression:               -  No gross lesions in esophagus proximally. LA                            Grade A esophagitis with no bleeding distally.                           - Z-line regular, 40 cm from the incisors.                           - 3 cm hiatal hernia.                           - Gastritis. Biopsied.                           - Duodenitis. Biopsied. Recommendation:           - Proceed to scheduled colonoscopy.                           - Observe patient's clinical course.                            - Await pathology results.                           - Initiate Omeprazole 40 mg twice daily x 2 months                            and then once daily thereafter.                           - Consider repeat EGD to ensure healing of                            esophagitis.                           - The findings and recommendations were discussed                            with the patient.                           - The findings and recommendations were discussed                            with the referring physician. Procedure Code(s):        --- Professional ---                           [id number], Esophagogastroduodenoscopy, flexible,                            transoral; with biopsy, single or multiple Diagnosis Code(s):        --- Professional ---  K44.9, Diaphragmatic hernia without obstruction or                            gangrene                           K29.70, Gastritis, unspecified, without bleeding                           K29.80, Duodenitis without bleeding                           D50.9, Iron deficiency anemia, unspecified CPT copyright 2019 American Medical Association. All rights reserved. The codes documented in this report are preliminary and upon coder review may  be revised to meet current compliance requirements. Justice Britain, MD 12/05/2021 4:42:49 PM Number of Addenda: 0

## 2021-12-05 NOTE — Progress Notes (Signed)
°   12/05/21 1005  Clinical Encounter Type  Visited With Patient  Visit Type Initial;Spiritual support  Referral From Nurse  Consult/Referral To Chaplain   Chaplain Tery Sanfilippo responded to the consult request for an Advanced Directive. Donnajean Lopes provided A.D. education. The patient declined stating her husband and sons would make decision if needed. Donnajean Lopes actively listened as the patient spoke about incidents leading her to be hospitalized. The patient's appears to light up when talking about her love for shopping. Donnajean Lopes ended visit with prayer. This note was prepared by Deneen Harts, M.Div..  For questions please contact by phone [phone].

## 2021-12-05 NOTE — Op Note (Signed)
[facility] Patient Name: Frances Cabrera Procedure Date : 12/05/2021 MRN: [id number] Attending MD: Justice Britain , MD Date of Birth: 10/31/1946 CSN: [id number] Age: 75 Admit Type: Inpatient Procedure:                Colonoscopy Indications:              Iron deficiency anemia Providers:                Justice Britain, MD, Grace Isaac, RN, Benetta Spar, Technician, Virgilio Belling. Beckner, CRNA Referring MD:             [facility] Medicines:                Monitored Anesthesia Care Complications:            No immediate complications. Estimated Blood Loss:     Estimated blood loss: none. Procedure:                Pre-Anesthesia Assessment:                           - Prior to the procedure, a History and Physical                            was performed, and patient medications and                            allergies were reviewed. The patient's tolerance of                            previous anesthesia was also reviewed. The risks                            and benefits of the procedure and the sedation                            options and risks were discussed with the patient.                            All questions were answered, and informed consent                            was obtained. Prior Anticoagulants: The patient has                            taken Eliquis (apixaban), last dose was 2 days                            prior to procedure. ASA Grade Assessment: III - A                            patient with severe systemic disease. After  reviewing the risks and benefits, the patient was                            deemed in satisfactory condition to undergo the                            procedure.                           After obtaining informed consent, the colonoscope                            was passed under direct vision. Throughout the                             procedure, the patient's blood pressure, pulse, and                            oxygen saturations were monitored continuously. The                            [id number]-HQ190TL ([id number] Olympus peds colonoscope was                            introduced through the anus and advanced to the the                            cecum, identified by appendiceal orifice and                            ileocecal valve. The colonoscopy was somewhat                            difficult due to significant looping. Successful                            completion of the procedure was aided by changing                            the patient's position, using manual pressure in                            the suprapubic region, straightening and shortening                            the scope to obtain bowel loop reduction and using                            scope torsion. Scope In: 12:54:12 PM Scope Out: 1:15:19 PM Scope Withdrawal Time: 0 hours 0 minutes 2 seconds  Total Procedure Duration: 0 hours 21 minutes 7 seconds  Findings:      The digital rectal exam findings include rectal tenderness and       hemorrhoids as well as rectal prolapse.      The  colon (entire examined portion) revealed significantly excessive       looping.      Multiple small-mouthed diverticula were found in the recto-sigmoid colon       and sigmoid colon.      Normal mucosa was found in the entire colon.      Moderate rectal prolapse was present.      Non-bleeding, non-thrombosed, prolapsed external and internal       hemorrhoids were found during retroflexion, during perianal exam and       during digital exam. The hemorrhoids were severe, large and Grade IV       (internal hemorrhoids that prolapse and cannot be reduced manually). Impression:               - Rectal tenderness and hemorrhoids as well as                            rectal prolapse found on digital rectal exam.                           - There was significant looping  of the colon.                           - Diverticulosis in the recto-sigmoid colon and in                            the sigmoid colon.                           - Normal mucosa in the entire examined colon.                           - Rectal prolapse.                           - Non-bleeding non-thrombosed prolapsed external                            and internal hemorrhoids. Recommendation:           - The patient will be observed post-procedure,                            until all discharge criteria are met.                           - Return patient to hospital ward for ongoing care.                           - Advance diet as tolerated.                           - Initiate Anusol suppositories QHS.                           - Preparation H 1-3 times daily to the  rectum/perineum.                           - The degree of microcytosis and IDA seems to be                            much more than expected. Thankfully no evidence of                            malignancy in the EGD/Colonoscopy regions. I do                            think a Video Capsule Endoscopy is reasonable, but                            I believe this can be done in the outpatient                            setting (as long as no evidence on CT scan that                            there is an abnormality in the small bowel; if any                            evidence of abnormality in the small bowel then                            will need to consider Inpatient VCE).                           - Colorectal surgery evaluation for hemorrhoidal                            disease and for rectal prolapse evaluation is                            recommended.                           - Repeat colonoscopy in 10 years for screening                            purposes.                           - The findings and recommendations were discussed                            with the patient.                            - The findings and recommendations were discussed                            with  the referring physician. Procedure Code(s):        --- Professional ---                           [phone], Colonoscopy, flexible; diagnostic, including                            collection of specimen(s) by brushing or washing,                            when performed (separate procedure) Diagnosis Code(s):        --- Professional ---                           E94.0, Fourth degree hemorrhoids                           K62.3, Rectal prolapse                           D50.9, Iron deficiency anemia, unspecified                           K57.30, Diverticulosis of large intestine without                            perforation or abscess without bleeding CPT copyright 2019 [employer]. All rights reserved. The codes documented in this report are preliminary and upon coder review may  be revised to meet current compliance requirements. Justice Britain, MD 12/05/2021 5:19:58 PM Number of Addenda: 0

## 2021-12-05 NOTE — Interval H&P Note (Signed)
History and Physical Interval Note:  12/05/2021 12:12 PM  Frances Cabrera  has presented today for surgery, with the diagnosis of Passing blood per rectum.  Severe microcytic anemia progressive over a couple of years.  No prior EGD or colonoscopy.  The various methods of treatment have been discussed with the patient and family. After consideration of risks, benefits and other options for treatment, the patient has consented to  Procedure(s): COLONOSCOPY WITH PROPOFOL (N/A) ESOPHAGOGASTRODUODENOSCOPY (EGD) WITH PROPOFOL (N/A) as a surgical intervention.  The patient's history has been reviewed, patient examined, no change in status, stable for surgery.  I have reviewed the patient's chart and labs.  Questions were answered to the patient's satisfaction.     [employer]

## 2021-12-05 NOTE — Consult Note (Addendum)
Frances Cabrera [birth year]  [id number].    Requesting MD: Dr. Erlinda Hong Chief Complaint/Reason for Consult: hemorrhoids, rectal prolapse  HPI:  Ms. Dottavio is a 75 yo female who was admitted with bleeding per rectum and anemia. She reports she has been having intermittent bright red blood per rectum for the last 2-3 weeks. She occasionally strains to have bowel movements but says she has bowel movements regularly. She denies perianal pain. She sometimes notices a bulge from the rectum, and says sometimes it reduces spontaneously but sometimes she has to push it back in. At admission yesterday her hgb was 4.9, and has improved to 8.6 after PRBC transfusion. She has remained hemodynamically stable. She is on Eliquis at home.  Today she underwent EGD and colonoscopy by Dr. Rush Landmark. During the colonoscopy she was noted to have internal hemorrhoids and rectal prolapse, as well as sigmoid diverticulosis but no signs of malignancy. EGD showed gastritis and duodenitis but no source of bleeding. General surgery was consulted for management of hemorrhoids and rectal prolapse. Her prior abdominal surgeries include a tubal ligation and cholecystectomy. She has had 4 vaginal deliveries.  ROS: Review of Systems  Constitutional:  Negative for chills and fever.  Respiratory:  Negative for shortness of breath and wheezing.   Cardiovascular:  Negative for chest pain.  Gastrointestinal:  Positive for blood in stool. Negative for abdominal pain, nausea and vomiting.  Neurological:  Positive for weakness.  Psychiatric/Behavioral:  Negative for memory loss.    Family History  Problem Relation Age of Onset   Diabetes Mother    Hypertension Mother    Kidney failure Mother    Diabetes Father    Diabetes Brother    Diabetes Brother     Past Medical History:  Diagnosis Date   Diabetes mellitus    Elevated LFTs    Hypertension    Menopause    Microalbuminuria    Obesity    Paroxysmal atrial  flutter ([place])    Recurrent UTI    post-coital    Past Surgical History:  Procedure Laterality Date   CHOLECYSTECTOMY  2000   TUBAL LIGATION      Social History:  reports that she has never smoked. She has never used smokeless tobacco. She reports that she does not drink alcohol and does not use drugs.  Allergies:  Allergies  Allergen Reactions   Penicillins Hives    Has patient had a PCN reaction causing immediate rash, facial/tongue/throat swelling, SOB or lightheadedness with hypotension: Yes Has patient had a PCN reaction causing severe rash involving mucus membranes or skin necrosis: Yes Has patient had a PCN reaction that required hospitalization No Has patient had a PCN reaction occurring within the last 10 years: No If all of the above answers are "NO", then may proceed with Cephalosporin use.    Iodinated Diagnostic Agents Rash   Shrimp [Shellfish Allergy] Rash    Medications Prior to Admission  Medication Sig Dispense Refill   acetaminophen (TYLENOL) 500 MG tablet Take 750 mg by mouth every 6 (six) hours as needed for moderate pain or headache. Reported on 05/31/2016     ELIQUIS 5 MG TABS tablet Take 1 tablet by mouth twice daily (Patient taking differently: Take 5 mg by mouth 2 (two) times daily.) 60 tablet 0   gabapentin (NEURONTIN) 300 MG capsule Take 300 mg by mouth 3 (three) times daily.     Ketotifen Fumarate (ALAWAY OP) Place 1 drop into both eyes 2 (two) times daily as  needed (itching eyes).     magnesium oxide (MAG-OX) 400 (240 Mg) MG tablet TAKE 1 TABLET THREE TIMES DAILY (Patient taking differently: Take 400 mg by mouth in the morning, at noon, and at bedtime.) 270 tablet 3   metFORMIN (GLUCOPHAGE) 500 MG tablet Take 1,000 mg by mouth 2 (two) times daily.     Multiple Vitamins-Minerals (MULTIVITAMIN WITH MINERALS) tablet Take 1 tablet by mouth daily. Centrum Silver     traMADol (ULTRAM) 50 MG tablet Take 50 mg by mouth every 6 (six) hours as needed for moderate  pain.     triamterene-hydrochlorothiazide (DYAZIDE) 37.5-25 MG capsule TAKE 1 CAPSULE EVERY DAY (Patient taking differently: Take 1 capsule by mouth daily.) 90 capsule 3   verapamil (CALAN-SR) 240 MG CR tablet TAKE 1 TABLET TWICE DAILY (Patient taking differently: Take 240 mg by mouth 2 (two) times daily.) 180 tablet 3   Blood Glucose Monitoring Suppl (ACCU-CHEK AVIVA PLUS) w/Device KIT USE AS DIRECTED 1 kit 0   Blood Pressure Monitoring (B-D ASSURE BPM/AUTO WRIST CUFF) MISC Use to measure home blood pressure. 1 each 0   EUTHYROX 25 MCG tablet Take 25 mcg by mouth every morning. (Patient not taking: Reported on 12/04/2021)     glucose blood test strip Use to test blood sugar once daily. E11.9 100 each 3   Lancet Device MISC Use for home glucose monitoring 1 each 3   metoprolol succinate (TOPROL-XL) 100 MG 24 hr tablet Take 1 tablet (100 mg total) by mouth in the morning. Take with or immediately following a meal. Hold if systolic blood pressure (top blood pressure number) less than 100 mmHg or heart rate less than 60 bpm (pulse). (Patient not taking: Reported on 12/04/2021) 90 tablet 3   potassium chloride (KLOR-CON) 10 MEQ tablet Take 10 mEq by mouth daily. (Patient not taking: Reported on 12/04/2021)       Physical Exam: Blood pressure (!) 152/73, pulse 85, temperature (!) 97.3 F (36.3 C), temperature source Oral, resp. rate 14, height 5' 11"  (1.803 m), weight 104.3 kg, SpO2 100 %. General: resting comfortably, appears stated age, no apparent distress Neurological: alert and oriented, no focal deficits, cranial nerves grossly in tact HEENT: normocephalic, atraumatic, oropharynx clear, no scleral icterus CV: regular rate and rhythm, extremities warm and well-perfused Respiratory: normal work of breathing on room air, symmetric chest wall expansion Abdomen: soft, nondistended, nontender to deep palpation. No masses or organomegaly. Well-healed surgical scars. Extremities: warm and  well-perfused, no deformities, moving all extremities spontaneously Psychiatric: normal mood and affect Skin: warm and dry, no jaundice, no rashes or lesions Anorectal: non-thrombosed external hemorrhoids. Partially prolapsed internal hemorrhoids, no active bleeding on exam. No evidence of rectal prolapse at time of my exam with valsalva maneuver.   Results for orders placed or performed during the hospital encounter of 12/03/21 (from the past 48 hour(s))  POC occult blood, ED Provider will collect     Status: None   Collection Time: 12/03/21  9:43 PM  Result Value Ref Range   Fecal Occult Bld NEGATIVE NEGATIVE  Resp Panel by RT-PCR (Flu A&B, Covid) Nasopharyngeal Swab     Status: None   Collection Time: 12/03/21  9:45 PM   Specimen: Nasopharyngeal Swab; Nasopharyngeal(NP) swabs in vial transport medium  Result Value Ref Range   SARS Coronavirus 2 by RT PCR NEGATIVE NEGATIVE    Comment: (NOTE) SARS-CoV-2 target nucleic acids are NOT DETECTED.  The SARS-CoV-2 RNA is generally detectable in upper respiratory specimens during the acute  phase of infection. The lowest concentration of SARS-CoV-2 viral copies this assay can detect is 138 copies/mL. A negative result does not preclude SARS-Cov-2 infection and should not be used as the sole basis for treatment or other patient management decisions. A negative result may occur with  improper specimen collection/handling, submission of specimen other than nasopharyngeal swab, presence of viral mutation(s) within the areas targeted by this assay, and inadequate number of viral copies(<138 copies/mL). A negative result must be combined with clinical observations, patient history, and epidemiological information. The expected result is Negative.  Fact Sheet for Patients:  EntrepreneurPulse.com.au  Fact Sheet for Healthcare Providers:  IncredibleEmployment.be  This test is no t yet approved or cleared by the  [place] FDA and  has been authorized for detection and/or diagnosis of SARS-CoV-2 by FDA under an Emergency Use Authorization (EUA). This EUA will remain  in effect (meaning this test can be used) for the duration of the COVID-19 declaration under Section 564(b)(1) of the Act, 21 U.S.C.section 360bbb-3(b)(1), unless the authorization is terminated  or revoked sooner.       Influenza A by PCR NEGATIVE NEGATIVE   Influenza B by PCR NEGATIVE NEGATIVE    Comment: (NOTE) The Xpert Xpress SARS-CoV-2/FLU/RSV plus assay is intended as an aid in the diagnosis of influenza from Nasopharyngeal swab specimens and should not be used as a sole basis for treatment. Nasal washings and aspirates are unacceptable for Xpert Xpress SARS-CoV-2/FLU/RSV testing.  Fact Sheet for Patients: EntrepreneurPulse.com.au  Fact Sheet for Healthcare Providers: IncredibleEmployment.be  This test is not yet approved or cleared by the [place] FDA and has been authorized for detection and/or diagnosis of SARS-CoV-2 by FDA under an Emergency Use Authorization (EUA). This EUA will remain in effect (meaning this test can be used) for the duration of the COVID-19 declaration under Section 564(b)(1) of the Act, 21 U.S.C. section 360bbb-3(b)(1), unless the authorization is terminated or revoked.  Performed at [facility], [place] [address] [place], [place] [address]   CBG monitoring, ED     Status: Abnormal   Collection Time: 12/03/21 10:11 PM  Result Value Ref Range   Glucose-Capillary 239 (H) 70 - 99 mg/dL    Comment: Glucose reference range applies only to samples taken after fasting for at least 8 hours.  Comprehensive metabolic panel     Status: Abnormal   Collection Time: 12/03/21 10:25 PM  Result Value Ref Range   Sodium 135 135 - 145 mmol/L   Potassium 3.5 3.5 - 5.1 mmol/L   Chloride 101 98 - 111 mmol/L   CO2 18 (L) 22 - 32 mmol/L   Glucose, Bld 246 (H)  70 - 99 mg/dL    Comment: Glucose reference range applies only to samples taken after fasting for at least 8 hours.   BUN 14 8 - 23 mg/dL   Creatinine, Ser 1.34 (H) 0.44 - 1.00 mg/dL   Calcium 9.3 8.9 - 10.3 mg/dL   Total Protein 7.4 6.5 - 8.1 g/dL   Albumin 3.7 3.5 - 5.0 g/dL   AST 41 15 - 41 U/L   ALT 27 0 - 44 U/L   Alkaline Phosphatase 37 (L) 38 - 126 U/L   Total Bilirubin 0.8 0.3 - 1.2 mg/dL   GFR, Estimated 42 (L) >60 mL/min    Comment: (NOTE) Calculated using the CKD-EPI Creatinine Equation (2021)    Anion gap 16 (H) 5 - 15    Comment: Performed at [facility], [address] [place], [place] [address]  Protime-INR     Status: Abnormal   Collection Time: 12/03/21 10:25 PM  Result Value Ref Range   Prothrombin Time 18.7 (H) 11.4 - 15.2 seconds   INR 1.6 (H) 0.8 - 1.2    Comment: (NOTE) INR goal varies based on device and disease states. Performed at [facility], [place] [address], [place], [place] [address]   Type and screen Anna     Status: None   Collection Time: 12/03/21 10:45 PM  Result Value Ref Range   ABO/RH(D) B POS    Antibody Screen NEG    Sample Expiration 12/06/2021,2359    Unit Number [id number]    Blood Component Type RED CELLS,LR    Unit division 00    Status of Unit ISSUED,FINAL    Transfusion Status OK TO TRANSFUSE    Crossmatch Result Compatible    Unit Number [id number]    Blood Component Type RED CELLS,LR    Unit division 00    Status of Unit ISSUED,FINAL    Transfusion Status OK TO TRANSFUSE    Crossmatch Result Compatible    Unit Number [id number]    Blood Component Type RED CELLS,LR    Unit division 00    Status of Unit ISSUED,FINAL    Transfusion Status OK TO TRANSFUSE    Crossmatch Result      Compatible Performed at [facility], [place] [address], [place], [place] [address]   ABO/Rh     Status: None   Collection Time: 12/03/21 10:55 PM  Result Value Ref Range   ABO/RH(D)      B  POS Performed at [facility] [address] [place], [place] [address]   CBC with Differential/Platelet     Status: Abnormal   Collection Time: 12/03/21 11:30 PM  Result Value Ref Range   WBC 11.4 (H) 4.0 - 10.5 K/uL   RBC 2.14 (L) 3.87 - 5.11 MIL/uL   Hemoglobin 4.1 (LL) 12.0 - 15.0 g/dL    Comment: REPEATED TO VERIFY Reticulocyte Hemoglobin testing may be clinically indicated, consider ordering this additional test [id number] THIS CRITICAL RESULT HAS VERIFIED AND BEEN CALLED TO E SULLIVAN RN BY CANDACE HAYES ON 12 20 2022 AT 2341, AND HAS BEEN READ BACK.     HCT 15.1 (L) 36.0 - 46.0 %   MCV 70.6 (L) 80.0 - 100.0 fL   MCH 19.2 (L) 26.0 - 34.0 pg   MCHC 27.2 (L) 30.0 - 36.0 g/dL   RDW 16.7 (H) 11.5 - 15.5 %   Platelets 407 (H) 150 - 400 K/uL   nRBC 0.0 0.0 - 0.2 %   Neutrophils Relative % 80 %   Neutro Abs 9.0 (H) 1.7 - 7.7 K/uL   Lymphocytes Relative 15 %   Lymphs Abs 1.7 0.7 - 4.0 K/uL   Monocytes Relative 5 %   Monocytes Absolute 0.6 0.1 - 1.0 K/uL   Eosinophils Relative 0 %   Eosinophils Absolute 0.0 0.0 - 0.5 K/uL   Basophils Relative 0 %   Basophils Absolute 0.0 0.0 - 0.1 K/uL   Immature Granulocytes 0 %   Abs Immature Granulocytes 0.05 0.00 - 0.07 K/uL    Comment: Performed at [facility] [address] [place], [place] [address]  Prepare RBC (crossmatch)     Status: None   Collection Time: 12/04/21 12:43 AM  Result Value Ref Range   Order Confirmation  ORDER PROCESSED BY BLOOD BANK Performed at [facility], [place] [address], [place], [place] [address]   Vitamin B12     Status: None   Collection Time: 12/04/21 12:44 AM  Result Value Ref Range   Vitamin B-12 524 180 - 914 pg/mL    Comment: (NOTE) This assay is not validated for testing neonatal or myeloproliferative syndrome specimens for Vitamin B12 levels. Performed at [facility], [place] [address], [place], [place] [address]   Folate     Status: None   Collection Time:  12/04/21 12:44 AM  Result Value Ref Range   Folate 22.7 >5.9 ng/mL    Comment: Performed at [facility] [address], [place], [place] [address]  Iron and TIBC     Status: Abnormal   Collection Time: 12/04/21 12:44 AM  Result Value Ref Range   Iron 14 (L) 28 - 170 ug/dL   TIBC 629 (H) 250 - 450 ug/dL   Saturation Ratios 2 (L) 10.4 - 31.8 %   UIBC 615 ug/dL    Comment: Performed at [facility], [place] [address], [place], [place] [address]  Ferritin     Status: Abnormal   Collection Time: 12/04/21 12:44 AM  Result Value Ref Range   Ferritin 4 (L) 11 - 307 ng/mL    Comment: Performed at [facility], [place] [address] [place], [place] [address]  Reticulocytes     Status: Abnormal   Collection Time: 12/04/21 12:44 AM  Result Value Ref Range   Retic Ct Pct 2.9 0.4 - 3.1 %   RBC. 2.57 (L) 3.87 - 5.11 MIL/uL   Retic Count, Absolute 73.2 19.0 - 186.0 K/uL   Immature Retic Fract 35.8 (H) 2.3 - 15.9 %    Comment: Performed at [facility] [address], [place], [place] [address]  Hemoglobin A1c     Status: Abnormal   Collection Time: 12/04/21 12:44 AM  Result Value Ref Range   Hgb A1c MFr Bld 8.3 (H) 4.8 - 5.6 %    Comment: (NOTE)         Prediabetes: 5.7 - 6.4         Diabetes: >6.4         Glycemic control for adults with diabetes: <7.0    Mean Plasma Glucose 192 mg/dL    Comment: (NOTE) Performed At: [facility] [place], [place] [id number] Rush Farmer MD KW:[id number]   CBC     Status: Abnormal   Collection Time: 12/04/21 12:44 AM  Result Value Ref Range   WBC 10.1 4.0 - 10.5 K/uL   RBC 2.58 (L) 3.87 - 5.11 MIL/uL   Hemoglobin 4.9 (LL) 12.0 - 15.0 g/dL    Comment: CRITICAL VALUE NOTED.  VALUE IS CONSISTENT WITH PREVIOUSLY REPORTED AND CALLED VALUE. REPEATED TO VERIFY Reticulocyte Hemoglobin testing may be clinically indicated, consider ordering this additional test [id number]    HCT 17.9 (L) 36.0 - 46.0 %   MCV 69.4  (L) 80.0 - 100.0 fL   MCH 19.0 (L) 26.0 - 34.0 pg   MCHC 27.4 (L) 30.0 - 36.0 g/dL   RDW 16.5 (H) 11.5 - 15.5 %   Platelets 357 150 - 400 K/uL   nRBC 0.0 0.0 - 0.2 %    Comment: Performed at [facility] [address], [place], [place] [address]  Basic metabolic panel     Status: Abnormal   Collection Time:  12/04/21 12:44 AM  Result Value Ref Range   Sodium 133 (L) 135 - 145 mmol/L   Potassium 3.8 3.5 - 5.1 mmol/L   Chloride 102 98 - 111 mmol/L   CO2 19 (L) 22 - 32 mmol/L   Glucose, Bld 228 (H) 70 - 99 mg/dL    Comment: Glucose reference range applies only to samples taken after fasting for at least 8 hours.   BUN 14 8 - 23 mg/dL   Creatinine, Ser 1.29 (H) 0.44 - 1.00 mg/dL   Calcium 8.8 (L) 8.9 - 10.3 mg/dL   GFR, Estimated 44 (L) >60 mL/min    Comment: (NOTE) Calculated using the CKD-EPI Creatinine Equation (2021)    Anion gap 12 5 - 15    Comment: Performed at [facility] [address], [place], [place] [address]  Prepare RBC (crossmatch)     Status: None   Collection Time: 12/04/21  2:30 AM  Result Value Ref Range   Order Confirmation      ORDER PROCESSED BY BLOOD BANK Performed at [facility], [address], [place], [place] [address]   CBG monitoring, ED     Status: Abnormal   Collection Time: 12/04/21  8:02 AM  Result Value Ref Range   Glucose-Capillary 177 (H) 70 - 99 mg/dL    Comment: Glucose reference range applies only to samples taken after fasting for at least 8 hours.  CBG monitoring, ED     Status: Abnormal   Collection Time: 12/04/21 12:31 PM  Result Value Ref Range   Glucose-Capillary 159 (H) 70 - 99 mg/dL    Comment: Glucose reference range applies only to samples taken after fasting for at least 8 hours.  CBC     Status: Abnormal   Collection Time: 12/04/21  5:01 PM  Result Value Ref Range   WBC 9.2 4.0 - 10.5 K/uL   RBC 3.12 (L) 3.87 - 5.11 MIL/uL   Hemoglobin 7.8 (L) 12.0 - 15.0 g/dL    Comment: REPEATED TO VERIFY POST  TRANSFUSION SPECIMEN Reticulocyte Hemoglobin testing may be clinically indicated, consider ordering this additional test [id number]    HCT 23.5 (L) 36.0 - 46.0 %   MCV 75.3 (L) 80.0 - 100.0 fL    Comment: POST TRANSFUSION SPECIMEN REPEATED TO VERIFY    MCH 25.0 (L) 26.0 - 34.0 pg   MCHC 33.2 30.0 - 36.0 g/dL   RDW 20.9 (H) 11.5 - 15.5 %   Platelets 327 150 - 400 K/uL   nRBC 0.4 (H) 0.0 - 0.2 %    Comment: Performed at [facility], [place] [address] [place], [place] [address]  TSH     Status: None   Collection Time: 12/04/21  5:01 PM  Result Value Ref Range   TSH 3.210 0.350 - 4.500 uIU/mL    Comment: Performed by a 3rd Generation assay with a functional sensitivity of <=0.01 uIU/mL. Performed at [facility], [place] [address], [place], [place] [address]   CBC     Status: Abnormal   Collection Time: 12/05/21  4:54 AM  Result Value Ref Range   WBC 11.9 (H) 4.0 - 10.5 K/uL   RBC 3.43 (L) 3.87 - 5.11 MIL/uL   Hemoglobin 8.6 (L) 12.0 - 15.0 g/dL    Comment: Reticulocyte Hemoglobin testing may be clinically indicated, consider ordering this additional test [id number]    HCT 26.0 (L) 36.0 - 46.0 %   MCV 75.8 (L) 80.0 - 100.0  fL   MCH 25.1 (L) 26.0 - 34.0 pg   MCHC 33.1 30.0 - 36.0 g/dL   RDW 21.1 (H) 11.5 - 15.5 %   Platelets 360 150 - 400 K/uL   nRBC 0.5 (H) 0.0 - 0.2 %    Comment: Performed at [facility] [address], [place], [place] [address]  Uric acid     Status: Abnormal   Collection Time: 12/05/21  4:54 AM  Result Value Ref Range   Uric Acid, Serum 10.4 (H) 2.5 - 7.1 mg/dL    Comment: Performed at [facility] [address], [place], [place] [address]  Basic metabolic panel     Status: Abnormal   Collection Time: 12/05/21  4:54 AM  Result Value Ref Range   Sodium 136 135 - 145 mmol/L   Potassium 3.3 (L) 3.5 - 5.1 mmol/L   Chloride 104 98 - 111 mmol/L   CO2 18 (L) 22 - 32 mmol/L   Glucose, Bld 194 (H) 70 - 99 mg/dL    Comment: Glucose  reference range applies only to samples taken after fasting for at least 8 hours.   BUN 9 8 - 23 mg/dL   Creatinine, Ser 1.20 (H) 0.44 - 1.00 mg/dL   Calcium 9.2 8.9 - 10.3 mg/dL   GFR, Estimated 48 (L) >60 mL/min    Comment: (NOTE) Calculated using the CKD-EPI Creatinine Equation (2021)    Anion gap 14 5 - 15    Comment: Performed at [facility] [address] [place], [place] [address]  Hemoglobin A1c     Status: Abnormal   Collection Time: 12/05/21  4:54 AM  Result Value Ref Range   Hgb A1c MFr Bld 6.4 (H) 4.8 - 5.6 %    Comment: (NOTE) Pre diabetes:          5.7%-6.4%  Diabetes:              >6.4%  Glycemic control for   <7.0% adults with diabetes    Mean Plasma Glucose 136.98 mg/dL    Comment: Performed at [facility] [address], [place], [place] [address]  Magnesium     Status: None   Collection Time: 12/05/21  4:54 AM  Result Value Ref Range   Magnesium 2.0 1.7 - 2.4 mg/dL    Comment: Performed at [facility] [address], [place], [place] [address]  Glucose, capillary     Status: Abnormal   Collection Time: 12/05/21  2:26 PM  Result Value Ref Range   Glucose-Capillary 153 (H) 70 - 99 mg/dL    Comment: Glucose reference range applies only to samples taken after fasting for at least 8 hours.  Glucose, capillary     Status: Abnormal   Collection Time: 12/05/21  4:06 PM  Result Value Ref Range   Glucose-Capillary 147 (H) 70 - 99 mg/dL    Comment: Glucose reference range applies only to samples taken after fasting for at least 8 hours.   CT HEAD WO CONTRAST  Result Date: 12/03/2021 CLINICAL DATA:  Head trauma. EXAM: CT HEAD WITHOUT CONTRAST TECHNIQUE: Contiguous axial images were obtained from the base of the skull through the vertex without intravenous contrast. COMPARISON:  CT of the head 03/17/2017 FINDINGS: Brain: No evidence of acute infarction, hemorrhage, hydrocephalus, extra-axial collection or mass lesion/mass effect. There is mild  periventricular white matter hypodensity, likely chronic small vessel ischemic change. This has increased compared prior study. Vascular: No  hyperdense vessel or unexpected calcification. Skull: Normal. Negative for fracture or focal lesion. Sinuses/Orbits: No acute finding. There is some mucosal thickening of the right frontal sinus. Other: None. IMPRESSION: No acute intracranial abnormality. Electronically Signed   By: Ronney Asters M.D.   On: 12/03/2021 23:15   DG Chest Port 1 View  Result Date: 12/03/2021 CLINICAL DATA:  Syncope. EXAM: PORTABLE CHEST 1 VIEW COMPARISON:  August 31, 2019 FINDINGS: There is no evidence of acute infiltrate, pleural effusion or pneumothorax. The heart size and mediastinal contours are within normal limits. The visualized skeletal structures are unremarkable. IMPRESSION: No active disease. Electronically Signed   By: Virgina Norfolk M.D.   On: 12/03/2021 22:02   DG Foot 2 Views Left  Result Date: 12/04/2021 CLINICAL DATA:  Left foot pain EXAM: LEFT FOOT - 2 VIEW COMPARISON:  None. FINDINGS: There is no evidence of fracture or dislocation. Diffuse demineralization. There is no evidence of arthropathy or other focal bone abnormality. Soft tissues are unremarkable. IMPRESSION: No acute osseous abnormality. Electronically Signed   By: Yetta Glassman M.D.   On: 12/04/2021 15:38   DG Foot 2 Views Right  Result Date: 12/04/2021 CLINICAL DATA:  Bilateral foot pain. EXAM: RIGHT FOOT - 2 VIEW COMPARISON:  Contralateral foot of the same date. FINDINGS: Osteopenia and degenerative changes. Signs of Achilles and plantar enthesopathy. Mild hallux valgus. No signs of fracture or dislocation. No substantial soft tissue swelling. IMPRESSION: Osteopenia and degenerative changes. No signs of acute fracture or dislocation. Electronically Signed   By: Zetta Bills M.D.   On: 12/04/2021 15:38      Assessment/Plan This is a 75 yo female presenting with anemia and painless  hematochezia. Her colonoscopy did not show any masses or signs of malignancy, and she does have large internal hemorrhoids which are most likely the source of her bleeding. Her rectum was not prolapsed during my exam but she may be experience intermittent prolapse and does have some risk factors for pelvic floor dysfunction (multiple vaginal deliveries).   For her hemorrhoids I recommended that she begin a fiber supplement and drink plenty of water to minimize straining. She may also try anusol suppositories. She may follow up as an outpatient with one of our colorectal surgeons to discuss hemorrhoidectomy and for further evaluation of possible rectal prolapse.   Michaelle Birks, MD [facility], Hepatobiliary and Pancreatic Surgery 12/05/21 5:14 PM

## 2021-12-05 NOTE — Progress Notes (Signed)
Barium slfate oral suspension smoothie 1/2 started at 1730.

## 2021-12-06 LAB — GLUCOSE, CAPILLARY
Glucose-Capillary: 148 mg/dL — ABNORMAL HIGH (ref 70–99)
Glucose-Capillary: 214 mg/dL — ABNORMAL HIGH (ref 70–99)
Glucose-Capillary: 128 mg/dL — ABNORMAL HIGH (ref 70–99)
Glucose-Capillary: 204 mg/dL — ABNORMAL HIGH (ref 70–99)

## 2021-12-06 LAB — URINALYSIS, ROUTINE W REFLEX MICROSCOPIC
Bilirubin Urine: NEGATIVE
Glucose, UA: NEGATIVE mg/dL
Hgb urine dipstick: NEGATIVE
Ketones, ur: 5 mg/dL — AB
Leukocytes,Ua: NEGATIVE
Nitrite: NEGATIVE
Protein, ur: NEGATIVE mg/dL
Specific Gravity, Urine: 1.015 (ref 1.005–1.030)
pH: 6 (ref 5.0–8.0)

## 2021-12-06 LAB — CBC WITH DIFFERENTIAL/PLATELET
Abs Immature Granulocytes: 0.04 10*3/uL (ref 0.00–0.07)
Basophils Absolute: 0.1 10*3/uL (ref 0.0–0.1)
Basophils Relative: 0 %
Eosinophils Absolute: 0.2 10*3/uL (ref 0.0–0.5)
Eosinophils Relative: 2 %
HCT: 24.9 % — ABNORMAL LOW (ref 36.0–46.0)
Hemoglobin: 8 g/dL — ABNORMAL LOW (ref 12.0–15.0)
Immature Granulocytes: 0 %
Lymphocytes Relative: 26 %
Lymphs Abs: 3 10*3/uL (ref 0.7–4.0)
MCH: 24.5 pg — ABNORMAL LOW (ref 26.0–34.0)
MCHC: 32.1 g/dL (ref 30.0–36.0)
MCV: 76.4 fL — ABNORMAL LOW (ref 80.0–100.0)
Monocytes Absolute: 0.8 10*3/uL (ref 0.1–1.0)
Monocytes Relative: 7 %
Neutro Abs: 7.4 10*3/uL (ref 1.7–7.7)
Neutrophils Relative %: 65 %
Platelets: 338 10*3/uL (ref 150–400)
RBC: 3.26 MIL/uL — ABNORMAL LOW (ref 3.87–5.11)
RDW: 21.8 % — ABNORMAL HIGH (ref 11.5–15.5)
WBC: 11.6 10*3/uL — ABNORMAL HIGH (ref 4.0–10.5)
nRBC: 0.2 % (ref 0.0–0.2)

## 2021-12-06 LAB — BASIC METABOLIC PANEL
Anion gap: 10 (ref 5–15)
BUN: 7 mg/dL — ABNORMAL LOW (ref 8–23)
CO2: 20 mmol/L — ABNORMAL LOW (ref 22–32)
Calcium: 9 mg/dL (ref 8.9–10.3)
Chloride: 104 mmol/L (ref 98–111)
Creatinine, Ser: 1 mg/dL (ref 0.44–1.00)
GFR, Estimated: 59 mL/min — ABNORMAL LOW (ref >60)
Glucose, Bld: 145 mg/dL — ABNORMAL HIGH (ref 70–99)
Potassium: 3.5 mmol/L (ref 3.5–5.1)
Sodium: 134 mmol/L — ABNORMAL LOW (ref 135–145)

## 2021-12-06 LAB — PREPARE RBC (CROSSMATCH)

## 2021-12-06 LAB — MAGNESIUM: Magnesium: 1.9 mg/dL (ref 1.7–2.4)

## 2021-12-06 MED ORDER — NAPHAZOLINE-GLYCERIN 0.012-0.25 % OP SOLN
1.0000 [drp] | Freq: Four times a day (QID) | OPHTHALMIC | Status: DC | PRN
  Administered 2021-12-06: 17:00:00 2 [drp] via OPHTHALMIC
  Filled 2021-12-06: qty 15

## 2021-12-06 MED ORDER — CALCIUM POLYCARBOPHIL 625 MG PO TABS
625.0000 mg | ORAL_TABLET | Freq: Every day | ORAL | Status: DC
  Administered 2021-12-06 – 2021-12-07 (×2): 625 mg via ORAL
  Filled 2021-12-06 (×2): qty 1

## 2021-12-06 MED ORDER — DILTIAZEM HCL ER COATED BEADS 120 MG PO CP24
120.0000 mg | ORAL_CAPSULE | Freq: Every day | ORAL | Status: DC
  Administered 2021-12-06 – 2021-12-07 (×2): 120 mg via ORAL
  Filled 2021-12-06 (×2): qty 1

## 2021-12-06 MED ORDER — SODIUM CHLORIDE 0.9% IV SOLUTION: Freq: Once | INTRAVENOUS | Status: AC

## 2021-12-06 MED ORDER — COLCHICINE 0.6 MG PO TABS
0.6000 mg | ORAL_TABLET | Freq: Every day | ORAL | Status: DC
  Administered 2021-12-06 – 2021-12-07 (×2): 0.6 mg via ORAL
  Filled 2021-12-06 (×2): qty 1

## 2021-12-06 MED ORDER — FERROUS SULFATE 325 (65 FE) MG PO TABS
325.0000 mg | ORAL_TABLET | Freq: Every day | ORAL | Status: DC
  Administered 2021-12-07: 08:00:00 325 mg via ORAL
  Filled 2021-12-06: qty 1

## 2021-12-06 NOTE — Evaluation (Signed)
Physical Therapy Evaluation Patient Details Name: Frances Cabrera MRN: [id number] DOB: 12-Feb-1946 Today's Date: 12/06/2021  History of Present Illness  75 y.o. female presents to [facility] hospital on 12/03/2021 for evaluation of syncope. Pt found to have anemia, Hgb of 4.1. Pt found to have hemorrhoids and rectal prolapse on colonoscopy. PMH includes HTN, DMT2.  Clinical Impression  Pt presents to PT with deficits in activity tolerance and gait. Pt reports mild DOE with activity, noted to be tachycardic but with stable oxygen sats. PT provides education on energy conservation strategies. Pt is able to ambulate and mobilize without physical assistance at this time. PT encourages frequent mobilization to aide in improving activity tolerance.       Recommendations for follow up therapy are one component of a multi-disciplinary discharge planning process, led by the attending physician.  Recommendations may be updated based on patient status, additional functional criteria and insurance authorization.  Follow Up Recommendations No PT follow up    Assistance Recommended at Discharge None  Functional Status Assessment Patient has had a recent decline in their functional status and demonstrates the ability to make significant improvements in function in a reasonable and predictable amount of time.  Equipment Recommendations  None recommended by PT    Recommendations for Other Services       Precautions / Restrictions Precautions Precautions: Fall Precaution Comments: tachycardia Restrictions Weight Bearing Restrictions: No      Mobility  Bed Mobility                    Transfers Overall transfer level: Needs assistance Equipment used: None Transfers: Sit to/from Stand Sit to Stand: Supervision                Ambulation/Gait Ambulation/Gait assistance: Supervision Gait Distance (Feet): 200 Feet Assistive device: None Gait Pattern/deviations: Step-through  pattern Gait velocity: reduced Gait velocity interpretation: 1.31 - 2.62 ft/sec, indicative of limited community ambulator   General Gait Details: pt with steady step-through gait  Stairs            Wheelchair Mobility    Modified Rankin (Stroke Patients Only)       Balance Overall balance assessment: Needs assistance Sitting-balance support: No upper extremity supported;Feet supported Sitting balance-Leahy Scale: Good     Standing balance support: No upper extremity supported Standing balance-Leahy Scale: Good                               Pertinent Vitals/Pain Pain Assessment: No/denies pain    Home Living Family/patient expects to be discharged to:: Private residence Living Arrangements: Spouse/significant other;Children Available Help at Discharge: Family;Available PRN/intermittently Type of Home: House Home Access: Stairs to enter Entrance Stairs-Rails: Can reach both Entrance Stairs-Number of Steps: 2   Home Layout: One level Home Equipment: None      Prior Function Prior Level of Function : Independent/Modified Independent                     Hand Dominance        Extremity/Trunk Assessment   Upper Extremity Assessment Upper Extremity Assessment: Overall WFL for tasks assessed    Lower Extremity Assessment Lower Extremity Assessment: Generalized weakness    Cervical / Trunk Assessment Cervical / Trunk Assessment: Normal  Communication   Communication: No difficulties  Cognition Arousal/Alertness: Awake/alert Behavior During Therapy: WFL for tasks assessed/performed Overall Cognitive Status: Within Functional Limits for tasks assessed  General Comments General comments (skin integrity, edema, etc.): pt tachy up to 141 observed by PT, recovers to 110s within 2 minutes of sitting    Exercises     Assessment/Plan    PT Assessment Patient needs continued PT  services  PT Problem List Decreased strength;Decreased activity tolerance;Decreased balance;Cardiopulmonary status limiting activity       PT Treatment Interventions Gait training;Stair training;Functional mobility training;Therapeutic activities;Therapeutic exercise;Balance training;Patient/family education    PT Goals (Current goals can be found in the Care Plan section)  Acute Rehab PT Goals Patient Stated Goal: to go home PT Goal Formulation: With patient Time For Goal Achievement: 12/20/21 Potential to Achieve Goals: Good    Frequency Min 3X/week   Barriers to discharge        Co-evaluation               AM-PAC PT "6 Clicks" Mobility  Outcome Measure Help needed turning from your back to your side while in a flat bed without using bedrails?: A Little Help needed moving from lying on your back to sitting on the side of a flat bed without using bedrails?: A Little Help needed moving to and from a bed to a chair (including a wheelchair)?: A Little Help needed standing up from a chair using your arms (e.g., wheelchair or bedside chair)?: A Little Help needed to walk in hospital room?: A Little Help needed climbing 3-5 steps with a railing? : A Little 6 Click Score: 18    End of Session   Activity Tolerance: Patient tolerated treatment well Patient left: in chair;with call bell/phone within reach Nurse Communication: Mobility status PT Visit Diagnosis: Other abnormalities of gait and mobility (R26.89);Muscle weakness (generalized) (M62.81)    Time: [id number] PT Time Calculation (min) (ACUTE ONLY): 21 min   Charges:   PT Evaluation $PT Eval Low Complexity: [place], PT, DPT Acute Rehabilitation Pager: [phone] Office [phone]   Zenaida Niece 12/06/2021, 12:36 PM

## 2021-12-06 NOTE — Progress Notes (Addendum)
PROGRESS NOTE    Frances Cabrera  [id number] DOB: Nov 15, 1946 DOA: 12/03/2021 PCP: Bartholome Bill, MD    Chief Complaint  Patient presents with   Loss of Consciousness   Hematuria    Brief Narrative:  Frances Cabrera is a 75 y.o. female with medical history significant for HTN, DMT2, PAF on eliquis who presents for evaluation of syncope.  She reports that for the last week she has had decreased energy level and she has been very tired for the last 3 days.  She reports when she has been standing up she gets dizzy and today she went to the bathroom and remembers turning on the water in the sink and then remembers waking up on the floor.  She is not sure how long she was on the ground.  Found to have anemia, hgb 4  Subjective:  Reports feeling tired, heart rate when up to 140's while walking with PT Continue to have fresh blood with bm last night and this morning   Eliquis has been on hold since admission  Assessment & Plan:   Principal Problem:   Symptomatic anemia Active Problems:   Diabetes mellitus type 2 in obese (HCC)   HTN (hypertension)   Syncope   AKI (acute kidney injury) (HCC)   Prolonged QT interval  Acute blood loss anemia/iron deficiency anemia iron / GI bleed -Hemoglobin was 4 on presentation, baseline 12 -Status post 3 units PRBC transfusion, Hgb 8. Today, reports continued intermittent bleeding, feeling weak, heart rate went up to 140 with minimal activity, will give another unit prbc, patient agrees  -Status post EGD and colonoscopy,  + esophagitis and gastritis ,details please refer to original reports -Continue PPI -CT abdomen/pel ordered by GI, may need capsule study pending CT findings -IV iron, need to discharge on oral Iron - follow GI recommendation regarding resumption of eliquis   Intermittent rectal prolapse/hemorrhoids General surgery consulted who recommended fiber supplement, adequate hydration, Anusol suppositories,  outpatient colorectal surgery follow-up   Non-insulin-dependent type 2 diabetes A1c this hospitalization is not reliable due to acute anemia Will need to follow-up with PCP to repeat A1c once anemia resolves A.m. blood glucose 145 this morning Continue carb modified diet SSI Plan to resume metformin at discharge  HTN: Discontinue verapamil as this could lead to constipation Restart betablocker, she states she run out this meds Will discuss with patient regarding acei/arb for renal protection in diabetes  PAF on eliquis Currently sinus rhythm Continue betablocker, eliquis held, resume when ok with gi F/u with cardiology Dr Terri Skains  Hypokalemia/hypomagnesemia Replace, recheck  AKI 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 progressive CKD, now CKD 3 A Creatinine 0.85 in 2020, Creatinine range from 1-1.34 here in the hospital UA did not suggest infection CT scan showed nonobstructive bilateral punctate nephrolithiasis, she denies back pain Renal dosing meds, cr improving   H/o Gout Report some foot pain, does not have significant edema, no erythema Foot x-ray no acute findings uric acid elevated, will try colchicine, avoid steroids in the setting of gastritis d/c diuretics Plan to start allopurinol at discharge Follow-up with PCP  Speculated 6 mm lung nodule, left lower lobe Repeat noncontrast CT scan in 6 to 74-month Follow-up with PCP  Aortic atherosclerosis Seen on CT Will check lipid panel   Body mass index is 32.07 kg/m.Marland Kitchen        Unresulted Labs (From admission, onward)     Start     Ordered   12/07/21 0500  CBC  Tomorrow morning,  R       Question:  Specimen collection method  Answer:  Lab=Lab collect   12/06/21 1742   12/07/21 0500  Magnesium  Tomorrow morning,   R       Question:  Specimen collection method  Answer:  Lab=Lab collect   12/06/21 1742   12/06/21 [id number]  Basic metabolic panel  Daily,   R     Question:  Specimen collection method  Answer:  Lab=Lab collect   12/05/21  1427              DVT prophylaxis: SCDs Start: 12/04/21 0155   Code Status: full Family Communication: patient  Disposition:   Status is: Inpatient   Dispo: The patient is from: Home              Anticipated d/c is to: Home              Anticipated d/c date is: possible on 12/23, need to resume anticoagulation if okay with GI , need hemoglobin stability , need to monitor bleeding                 Consultants:  GI General surgery  Procedures:  EGD/colonoscopy PRBC transfusion  Antimicrobials:   Anti-infectives (From admission, onward)    None           Objective: Vitals:   12/06/21 0000 12/06/21 0748 12/06/21 1207 12/06/21 1628  BP: (!) 145/68 (!) 157/77 136/77 137/72  Pulse: 87 100 (!) 101 95  Resp: 16 18 20 18   Temp: 98.9 F (37.2 C) 98.8 F (37.1 C) 98.8 F (37.1 C) 98.7 F (37.1 C)  TempSrc: Oral Oral Oral Oral  SpO2: 98% 98% 93% 96%  Weight:      Height:        Intake/Output Summary (Last 24 hours) at 12/06/2021 1745 Last data filed at 12/06/2021 1723 Gross per 24 hour  Intake 120 ml  Output --  Net 120 ml   Filed Weights   12/03/21 2123 12/05/21 1210  Weight: 104.3 kg 104.3 kg    Examination:  General exam: alert, awake, communicative,calm, NAD Respiratory system: Clear to auscultation. Respiratory effort normal. Cardiovascular system:  RRR.  Gastrointestinal system: Abdomen is nondistended, soft and nontender.  Normal bowel sounds heard. Central nervous system: Alert and oriented. No focal neurological deficits. Extremities:  no edema Skin: No rashes, lesions or ulcers Psychiatry: Judgement and insight appear normal. Mood & affect appropriate.     Data Reviewed: I have personally reviewed following labs and imaging studies  CBC: Recent Labs  Lab 12/03/21 2330 12/04/21 0044 12/04/21 1701 12/05/21 0454 12/06/21 0137  WBC 11.4* 10.1 9.2 11.9* 11.6*  NEUTROABS 9.0*  --   --   --  7.4  HGB 4.1* 4.9* 7.8* 8.6* 8.0*   HCT 15.1* 17.9* 23.5* 26.0* 24.9*  MCV 70.6* 69.4* 75.3* 75.8* 76.4*  PLT 407* 357 327 360 [id number]    Basic Metabolic Panel: Recent Labs  Lab 12/03/21 2225 12/04/21 0044 12/05/21 0454 12/06/21 0137  NA 135 133* 136 134*  K 3.5 3.8 3.3* 3.5  CL 101 102 104 104  CO2 18* 19* 18* 20*  GLUCOSE 246* 228* 194* 145*  BUN 14 14 9  7*  CREATININE 1.34* 1.29* 1.20* 1.00  CALCIUM 9.3 8.8* 9.2 9.0  MG  --   --  2.0 1.9    GFR: Estimated Creatinine Clearance: 66.6 mL/min (by C-G formula based on SCr of 1 mg/dL).  Liver Function Tests: Recent  Labs  Lab 12/03/21 2225  AST 41  ALT 27  ALKPHOS 37*  BILITOT 0.8  PROT 7.4  ALBUMIN 3.7    CBG: Recent Labs  Lab 12/05/21 1606 12/05/21 2139 12/06/21 0751 12/06/21 1335 12/06/21 1628  GLUCAP 147* 177* 148* 214* 128*     Recent Results (from the past 240 hour(s))  Resp Panel by RT-PCR (Flu A&B, Covid) Nasopharyngeal Swab     Status: None   Collection Time: 12/03/21  9:45 PM   Specimen: Nasopharyngeal Swab; Nasopharyngeal(NP) swabs in vial transport medium  Result Value Ref Range Status   SARS Coronavirus 2 by RT PCR NEGATIVE NEGATIVE Final    Comment: (NOTE) SARS-CoV-2 target nucleic acids are NOT DETECTED.  The SARS-CoV-2 RNA is generally detectable in upper respiratory specimens during the acute phase of infection. The lowest concentration of SARS-CoV-2 viral copies this assay can detect is 138 copies/mL. A negative result does not preclude SARS-Cov-2 infection and should not be used as the sole basis for treatment or other patient management decisions. A negative result may occur with  improper specimen collection/handling, submission of specimen other than nasopharyngeal swab, presence of viral mutation(s) within the areas targeted by this assay, and inadequate number of viral copies(<138 copies/mL). A negative result must be combined with clinical observations, patient history, and epidemiological information. The  expected result is Negative.  Fact Sheet for Patients:  BloggerCourse.com  Fact Sheet for Healthcare Providers:  SeriousBroker.it  This test is no t yet approved or cleared by the [place] FDA and  has been authorized for detection and/or diagnosis of SARS-CoV-2 by FDA under an Emergency Use Authorization (EUA). This EUA will remain  in effect (meaning this test can be used) for the duration of the COVID-19 declaration under Section 564(b)(1) of the Act, 21 U.S.C.section 360bbb-3(b)(1), unless the authorization is terminated  or revoked sooner.       Influenza A by PCR NEGATIVE NEGATIVE Final   Influenza B by PCR NEGATIVE NEGATIVE Final    Comment: (NOTE) The Xpert Xpress SARS-CoV-2/FLU/RSV plus assay is intended as an aid in the diagnosis of influenza from Nasopharyngeal swab specimens and should not be used as a sole basis for treatment. Nasal washings and aspirates are unacceptable for Xpert Xpress SARS-CoV-2/FLU/RSV testing.  Fact Sheet for Patients: BloggerCourse.com  Fact Sheet for Healthcare Providers: SeriousBroker.it  This test is not yet approved or cleared by the [place] FDA and has been authorized for detection and/or diagnosis of SARS-CoV-2 by FDA under an Emergency Use Authorization (EUA). This EUA will remain in effect (meaning this test can be used) for the duration of the COVID-19 declaration under Section 564(b)(1) of the Act, 21 U.S.C. section 360bbb-3(b)(1), unless the authorization is terminated or revoked.  Performed at [facility], [address] [place], [place] [address]          Radiology Studies: CT ABDOMEN PELVIS WO CONTRAST  Result Date: 12/05/2021 CLINICAL DATA:  Duodenitis Pelvic organ prolapse suspected (Female) Severe microcytic Anemia, heme positive stool., rule out small bowel lesion that could be an etiology for  severe anemia EXAM: CT ABDOMEN AND PELVIS WITHOUT CONTRAST TECHNIQUE: Multidetector CT imaging of the abdomen and pelvis was performed following the standard protocol without IV contrast. COMPARISON:  None. FINDINGS: Lower chest: Partially visualized likely spiculated at least 6 mm left lower lobe pulmonary nodule (5:3). Findings suggestive of anemia. Small hiatal hernia. Hepatobiliary: No focal liver abnormality. Status post cholecystectomy. No biliary dilatation. Pancreas: No focal lesion.  Normal pancreatic contour. No surrounding inflammatory changes. No main pancreatic ductal dilatation. Spleen: Normal in size without focal abnormality. Adrenals/Urinary Tract: No adrenal nodule bilaterally. Punctate left nephrolithiasis. Possible punctate right nephrolithiasis. No hydronephrosis. A 3.4 cm fluid density lesion within left kidney likely represents a simple renal cyst. No ureterolithiasis or hydroureter. The urinary bladder is unremarkable. Stomach/Bowel: Fullness of the gastric fundus likely due to under distension (3:20). Stomach is within normal limits. No evidence of bowel wall thickening or dilatation. Appendix appears normal. Rectal prolapse is noted. The Vascular/Lymphatic: No abdominal aorta or iliac aneurysm. Mild atherosclerotic plaque of the aorta and its branches. No abdominal, pelvic, or inguinal lymphadenopathy. Reproductive: Uterus and bilateral adnexa are unremarkable. Bilateral tubal ligation. Other: No intraperitoneal free fluid. No intraperitoneal free gas. No organized fluid collection. Musculoskeletal: No abdominal wall hernia or abnormality. No suspicious lytic or blastic osseous lesions. No acute displaced fracture. Multilevel degenerative changes of the spine. IMPRESSION: 1. Please note markedly limited evaluation on this noncontrast study. 2. Partially visualized likely spiculated at least 6 mm left lower lobe pulmonary nodule. Non-contrast chest CT at 6-12 months is recommended. If the  nodule is stable at time of repeat CT, then future CT at 18-24 months (from today's scan) is considered optional for low-risk patients, but is recommended for high-risk patients. This recommendation follows the consensus statement: Guidelines for Management of Incidental Pulmonary Nodules Detected on CT Images: From the Fleischner Society 2017; Radiology 2017; 284:228-243. 3. Small hiatal hernia. 4. Nonobstructive bilateral punctate nephrolithiasis. 5. Rectal prolapse. 6.  Aortic Atherosclerosis (ICD10-I70.0). Electronically Signed   By: Iven Finn M.D.   On: 12/05/2021 20:17        Scheduled Meds:  sodium chloride   Intravenous Once   colchicine  0.6 mg Oral Daily   diltiazem  120 mg Oral Daily   gabapentin  300 mg Oral BID   hydrocortisone  25 mg Rectal QHS   hydrocortisone cream   Topical BID   insulin aspart  0-9 Units Subcutaneous TID WC & HS   levothyroxine  25 mcg Oral q morning   metoCLOPramide (REGLAN) injection  10 mg Intravenous Once   metoprolol succinate  100 mg Oral q AM   pantoprazole  40 mg Oral BID AC   polycarbophil  625 mg Oral Daily   Continuous Infusions:  sodium chloride       LOS: 2 days   Time spent: 8mins Greater than 50% of this time was spent in counseling, explanation of diagnosis, planning of further management, and coordination of care.   Voice Recognition Viviann Spare dictation system was used to create this note, attempts have been made to correct errors. Please contact the author with questions and/or clarifications.   Florencia Reasons, MD PhD FACP [facility]  Available via Epic secure chat 7am-7pm for nonurgent issues Please page for urgent issues To page the attending provider between 7A-7P or the covering provider during after hours 7P-7A, please log into the web site [email or web address] and access using universal [place] password for that web site. If you do not have the password, please call the hospital operator.    12/06/2021, 5:45 PM

## 2021-12-06 NOTE — Progress Notes (Addendum)
Daily Rounding Note  12/06/2021, 1:02 PM  LOS: 2 days   SUBJECTIVE:   Chief complaint:    Iron deficiency anemia.  Bleeding prolapsed rectum and prolapsed hemorrhoids  Patient feels great.  She did get a bit tachycardic with walking with PT but was not symptomatic from it.   Has seen bleeding per rectum last night and again this morning.  OBJECTIVE:         Vital signs in last 24 hours:    Temp:  [97.3 F (36.3 C)-98.9 F (37.2 C)] 98.8 F (37.1 C) (12/23 1207) Pulse Rate:  [79-101] 101 (12/23 1207) Resp:  [14-20] 20 (12/23 1207) BP: (136-160)/(48-77) 136/77 (12/23 1207) SpO2:  [93 %-100 %] 93 % (12/23 1207) Last BM Date: 12/05/21 Filed Weights   12/03/21 2123 12/05/21 1210  Weight: 104.3 kg 104.3 kg   General: Pleasant, well-appearing.  NAD Heart: RRR. Chest: Clear bilaterally no labored breathing or cough Abdomen: Large.  Not tender.  Not distended.  Soft.  Active bowel sounds Extremities: No CCE. Neuro/Psych: Alert and oriented x3.  Pleasant affect.  Calm.  Intake/Output from previous day: 12/22 0701 - 12/23 0700 In: 400 [I.V.:400] Out: -   Intake/Output this shift: No intake/output data recorded.  Lab Results: Recent Labs    12/04/21 1701 12/05/21 0454 12/06/21 0137  WBC 9.2 11.9* 11.6*  HGB 7.8* 8.6* 8.0*  HCT 23.5* 26.0* 24.9*  PLT 327 360 338   BMET Recent Labs    12/04/21 0044 12/05/21 0454 12/06/21 0137  NA 133* 136 134*  K 3.8 3.3* 3.5  CL 102 104 104  CO2 19* 18* 20*  GLUCOSE 228* 194* 145*  BUN 14 9 7*  CREATININE 1.29* 1.20* 1.00  CALCIUM 8.8* 9.2 9.0   LFT Recent Labs    12/03/21 2225  PROT 7.4  ALBUMIN 3.7  AST 41  ALT 27  ALKPHOS 37*  BILITOT 0.8   PT/INR Recent Labs    12/03/21 2225  LABPROT 18.7*  INR 1.6*   Hepatitis Panel No results for input(s): HEPBSAG, HCVAB, HEPAIGM, HEPBIGM in the last 72 hours.  Studies/Results: CT ABDOMEN PELVIS WO  CONTRAST  Result Date: 12/05/2021 CLINICAL DATA:  Duodenitis Pelvic organ prolapse suspected (Female) Severe microcytic Anemia, heme positive stool., rule out small bowel lesion that could be an etiology for severe anemia EXAM: CT ABDOMEN AND PELVIS WITHOUT CONTRAST TECHNIQUE: Multidetector CT imaging of the abdomen and pelvis was performed following the standard protocol without IV contrast. COMPARISON:  None. FINDINGS: Lower chest: Partially visualized likely spiculated at least 6 mm left lower lobe pulmonary nodule (5:3). Findings suggestive of anemia. Small hiatal hernia. Hepatobiliary: No focal liver abnormality. Status post cholecystectomy. No biliary dilatation. Pancreas: No focal lesion. Normal pancreatic contour. No surrounding inflammatory changes. No main pancreatic ductal dilatation. Spleen: Normal in size without focal abnormality. Adrenals/Urinary Tract: No adrenal nodule bilaterally. Punctate left nephrolithiasis. Possible punctate right nephrolithiasis. No hydronephrosis. A 3.4 cm fluid density lesion within left kidney likely represents a simple renal cyst. No ureterolithiasis or hydroureter. The urinary bladder is unremarkable. Stomach/Bowel: Fullness of the gastric fundus likely due to under distension (3:20). Stomach is within normal limits. No evidence of bowel wall thickening or dilatation. Appendix appears normal. Rectal prolapse is noted. The Vascular/Lymphatic: No abdominal aorta or iliac aneurysm. Mild atherosclerotic plaque of the aorta and its branches. No abdominal, pelvic, or inguinal lymphadenopathy. Reproductive: Uterus and bilateral adnexa are unremarkable. Bilateral tubal ligation. Other:  No intraperitoneal free fluid. No intraperitoneal free gas. No organized fluid collection. Musculoskeletal: No abdominal wall hernia or abnormality. No suspicious lytic or blastic osseous lesions. No acute displaced fracture. Multilevel degenerative changes of the spine. IMPRESSION: 1. Please  note markedly limited evaluation on this noncontrast study. 2. Partially visualized likely spiculated at least 6 mm left lower lobe pulmonary nodule. Non-contrast chest CT at 6-12 months is recommended. If the nodule is stable at time of repeat CT, then future CT at 18-24 months (from today's scan) is considered optional for low-risk patients, but is recommended for high-risk patients. This recommendation follows the consensus statement: Guidelines for Management of Incidental Pulmonary Nodules Detected on CT Images: From the Fleischner Society 2017; Radiology 2017; 284:228-243. 3. Small hiatal hernia. 4. Nonobstructive bilateral punctate nephrolithiasis. 5. Rectal prolapse. 6.  Aortic Atherosclerosis (ICD10-I70.0). Electronically Signed   By: Tish Frederickson M.D.   On: 12/05/2021 20:17   DG Foot 2 Views Left  Result Date: 12/04/2021 CLINICAL DATA:  Left foot pain EXAM: LEFT FOOT - 2 VIEW COMPARISON:  None. FINDINGS: There is no evidence of fracture or dislocation. Diffuse demineralization. There is no evidence of arthropathy or other focal bone abnormality. Soft tissues are unremarkable. IMPRESSION: No acute osseous abnormality. Electronically Signed   By: Allegra Lai M.D.   On: 12/04/2021 15:38   DG Foot 2 Views Right  Result Date: 12/04/2021 CLINICAL DATA:  Bilateral foot pain. EXAM: RIGHT FOOT - 2 VIEW COMPARISON:  Contralateral foot of the same date. FINDINGS: Osteopenia and degenerative changes. Signs of Achilles and plantar enthesopathy. Mild hallux valgus. No signs of fracture or dislocation. No substantial soft tissue swelling. IMPRESSION: Osteopenia and degenerative changes. No signs of acute fracture or dislocation. Electronically Signed   By: Donzetta Kohut M.D.   On: 12/04/2021 15:38    Scheduled Meds:  sodium chloride   Intravenous Once   colchicine  0.6 mg Oral Daily   diltiazem  120 mg Oral Daily   gabapentin  300 mg Oral BID   hydrocortisone  25 mg Rectal QHS   hydrocortisone  cream   Topical BID   insulin aspart  0-9 Units Subcutaneous TID WC & HS   levothyroxine  25 mcg Oral q morning   metoCLOPramide (REGLAN) injection  10 mg Intravenous Once   metoprolol succinate  100 mg Oral q AM   pantoprazole  40 mg Oral BID AC   polycarbophil  625 mg Oral Daily   Continuous Infusions:  sodium chloride     sodium chloride     PRN Meds:.acetaminophen **OR** acetaminophen, lip balm, metoprolol tartrate   ASSESMENT:      IDA.  Bleeding PR.  FOBT actually negative on single assay 12/20 3 PRBCs thus far.  Hgb 4.9  >> 8.  Iron deficient with low iron, low ferritin. Received Ferrlicit infusion this AM 12/05/2021 EGD: Mild, nonbleeding esophagitis.  3 cm HH.  Patchy erosive, friable gastritis in stomach and duodenum, biopsied 12/05/2021 colonoscopy: Rectal prolapse w nonbleeding prolapsed external/internal hemorrhoids.  Significant looping in colon.  Sigmoid and rectosigmoid diverticulosis.  Otherwise normal study. Dr. Sophronia Simas from surgery has seen the patient.  On her exam she did not see rectal prolapse so suspect this is intermittent and patient may have pelvic floor dysfunction.  She recommended fiber supplement, drinking lots of water, Anusol suppositories.  Suggested follow-up with a colorectal surgeon at [facility] for consideration of hemorrhoidectomy and further evaluation of rectal prolapse.     Chronic Eliquis,  on hold.  Hx PAF.       Spiculated lung nodule.   PLAN   Rectosigmoid surgical evaluation for possible repair of rectal prolapse.  PPI, full dose bid for 2 months.  At follow-up with Dr. Rush Landmark may consider repeating EGD to assure healing of esophagitis and may pursue ouypt video capsule endoscopy.  His office will contact pt with date/time of follow-up office visit     Await results of surgical pathology from yesterday.  After discharge should have CBC within 7 to 10 days and possibly a few weeks after that, need to assure  Hgb continues to improve.     Need to determine timing of restarting Eliquis    Azucena Freed  12/06/2021, 1:02 PM Phone [phone]

## 2021-12-06 NOTE — Plan of Care (Signed)

## 2021-12-06 NOTE — Plan of Care (Signed)
  Problem: Education: Goal: Knowledge of General Education information will improve Description: Including pain rating scale, medication(s)/side effects and non-pharmacologic comfort measures Outcome: Progressing   Problem: Safety: Goal: Ability to remain free from injury will improve Outcome: Progressing   

## 2021-12-06 NOTE — Care Management Important Message (Signed)
Important Message  Patient Details  Name: Frances Cabrera MRN: [id number] Date of Birth: 1945-12-26   Medicare Important Message Given:  Yes     Cherylene Ferrufino Stefan Church 12/06/2021, 3:58 PM

## 2021-12-07 LAB — TYPE AND SCREEN
ABO/RH(D): B POS
Antibody Screen: NEGATIVE
Unit division: 0
Unit division: 0
Unit division: 0
Unit division: 0

## 2021-12-07 LAB — BPAM RBC
Blood Product Expiration Date: 202301132359
Blood Product Expiration Date: 202301132359
Blood Product Expiration Date: 202301132359
Blood Product Expiration Date: 202301142359
ISSUE DATE / TIME: 202212210230
ISSUE DATE / TIME: 202212210548
ISSUE DATE / TIME: 202212211145
ISSUE DATE / TIME: 202212231820
Unit Type and Rh: 7300
Unit Type and Rh: 7300
Unit Type and Rh: 7300
Unit Type and Rh: 7300

## 2021-12-07 LAB — LIPID PANEL
Cholesterol: 124 mg/dL (ref 0–200)
HDL: 34 mg/dL — ABNORMAL LOW (ref >40)
LDL Cholesterol: 67 mg/dL (ref 0–99)
Total CHOL/HDL Ratio: 3.6 RATIO
Triglycerides: 115 mg/dL (ref <150)
VLDL: 23 mg/dL (ref 0–40)

## 2021-12-07 LAB — CBC
HCT: 25.4 % — ABNORMAL LOW (ref 36.0–46.0)
Hemoglobin: 8.3 g/dL — ABNORMAL LOW (ref 12.0–15.0)
MCH: 25 pg — ABNORMAL LOW (ref 26.0–34.0)
MCHC: 32.7 g/dL (ref 30.0–36.0)
MCV: 76.5 fL — ABNORMAL LOW (ref 80.0–100.0)
Platelets: 319 10*3/uL (ref 150–400)
RBC: 3.32 MIL/uL — ABNORMAL LOW (ref 3.87–5.11)
RDW: 21.8 % — ABNORMAL HIGH (ref 11.5–15.5)
WBC: 7.2 10*3/uL (ref 4.0–10.5)
nRBC: 0 % (ref 0.0–0.2)

## 2021-12-07 LAB — GLUCOSE, CAPILLARY
Glucose-Capillary: 143 mg/dL — ABNORMAL HIGH (ref 70–99)
Glucose-Capillary: 212 mg/dL — ABNORMAL HIGH (ref 70–99)

## 2021-12-07 LAB — MAGNESIUM: Magnesium: 1.9 mg/dL (ref 1.7–2.4)

## 2021-12-07 LAB — BASIC METABOLIC PANEL
Anion gap: 10 (ref 5–15)
BUN: 8 mg/dL (ref 8–23)
CO2: 22 mmol/L (ref 22–32)
Calcium: 9 mg/dL (ref 8.9–10.3)
Chloride: 104 mmol/L (ref 98–111)
Creatinine, Ser: 1.25 mg/dL — ABNORMAL HIGH (ref 0.44–1.00)
GFR, Estimated: 46 mL/min — ABNORMAL LOW (ref >60)
Glucose, Bld: 160 mg/dL — ABNORMAL HIGH (ref 70–99)
Potassium: 3.5 mmol/L (ref 3.5–5.1)
Sodium: 136 mmol/L (ref 135–145)

## 2021-12-07 MED ORDER — HYDROCORTISONE 1 % EX CREA: TOPICAL_CREAM | Freq: Two times a day (BID) | CUTANEOUS | 0 refills | Status: AC

## 2021-12-07 MED ORDER — APIXABAN 5 MG PO TABS: 5.0000 mg | ORAL_TABLET | Freq: Two times a day (BID) | ORAL | Status: AC

## 2021-12-07 MED ORDER — LOSARTAN POTASSIUM 25 MG PO TABS: 25.0000 mg | ORAL_TABLET | Freq: Every day | ORAL | 0 refills | Status: AC

## 2021-12-07 MED ORDER — HYDROCORTISONE ACETATE 25 MG RE SUPP: 25.0000 mg | Freq: Every day | RECTAL | 0 refills | Status: AC

## 2021-12-07 MED ORDER — COLCHICINE 0.6 MG PO TABS: 0.3000 mg | ORAL_TABLET | Freq: Every day | ORAL | 0 refills | Status: AC

## 2021-12-07 MED ORDER — FOLIC ACID 1 MG PO TABS: 1.0000 mg | ORAL_TABLET | Freq: Every day | ORAL | 3 refills | Status: AC

## 2021-12-07 MED ORDER — LOSARTAN POTASSIUM 50 MG PO TABS: 25.0000 mg | ORAL_TABLET | Freq: Every day | ORAL | Status: DC

## 2021-12-07 MED ORDER — METOPROLOL SUCCINATE ER 100 MG PO TB24: 100.0000 mg | ORAL_TABLET | Freq: Every morning | ORAL | 3 refills | Status: AC

## 2021-12-07 MED ORDER — VITAMIN B-12 1000 MCG PO TABS: 1000.0000 ug | ORAL_TABLET | Freq: Every day | ORAL | Status: AC

## 2021-12-07 MED ORDER — VITAMIN C 250 MG PO TABS: 250.0000 mg | ORAL_TABLET | Freq: Every day | ORAL | 0 refills | Status: AC

## 2021-12-07 MED ORDER — FERROUS SULFATE 325 (65 FE) MG PO TABS: 325.0000 mg | ORAL_TABLET | Freq: Every day | ORAL | 3 refills | Status: AC

## 2021-12-07 MED ORDER — MAGNESIUM OXIDE -MG SUPPLEMENT 400 (240 MG) MG PO TABS: 400.0000 mg | ORAL_TABLET | Freq: Every day | ORAL | Status: AC

## 2021-12-07 MED ORDER — CALCIUM POLYCARBOPHIL 625 MG PO TABS: 625.0000 mg | ORAL_TABLET | Freq: Every day | ORAL | 3 refills | Status: AC

## 2021-12-07 MED ORDER — ALLOPURINOL 100 MG PO TABS: 100.0000 mg | ORAL_TABLET | Freq: Every day | ORAL | 3 refills | Status: AC

## 2021-12-07 MED ORDER — ALLOPURINOL 100 MG PO TABS: 100.0000 mg | ORAL_TABLET | Freq: Every day | ORAL | Status: DC

## 2021-12-07 MED ORDER — DILTIAZEM HCL ER COATED BEADS 120 MG PO CP24: 120.0000 mg | ORAL_CAPSULE | Freq: Every day | ORAL | 0 refills | Status: AC

## 2021-12-07 MED ORDER — PANTOPRAZOLE SODIUM 40 MG PO TBEC: 40.0000 mg | DELAYED_RELEASE_TABLET | Freq: Two times a day (BID) | ORAL | 2 refills | Status: AC

## 2021-12-07 NOTE — Plan of Care (Signed)

## 2021-12-07 NOTE — Discharge Summary (Signed)
Discharge Summary  Frances Cabrera QIH:[id number] DOB: Sep 12, 1946  PCP: Bartholome Bill, MD  Admit date: 12/03/2021 Discharge date: 12/07/2021  Time spent: 67mns, more than 50% time spent on coordination of care.   Recommendations for Outpatient Follow-up:  F/u with PCP within a week  for hospital discharge follow up, repeat cbc/bmp at follow up, PCP to repeat CT chest in 6 to 178-montho follow-up left lower lobe lung nodule, PCP to monitor uric acid level Follow-up with GI, need to repeat EGD after possible outpatient capsule study Follow-up with general surgery for hemorrhoids and rectal prolapse management Follow-up with cardiology for A. fib management     Discharge Diagnoses:  Active Hospital Problems   Diagnosis Date Noted   Symptomatic anemia 12/04/2021   Syncope 12/04/2021   [facility] [place] [facility] [place]   Prolonged QT interval 12/04/2021   HTN (hypertension) 05/30/2012   [facility]    Resolved Hospital Problems  No resolved problems to display.    Discharge Condition: stable  Diet recommendation: heart healthy/carb mod, avoid diet that could trigger gout flare  Filed Weights   12/03/21 2123 12/05/21 1210  Weight: 104.3 kg 104.3 kg    History of present illness: ( per admitting MD Dr ChTonie GriffithChief Complaint: syncope, fatigue   HPI: PhMazi Schuffs a [age].o. female with medical history significant for HTN, DMT2 who presents for evaluation of syncope.  She reports that for the last week she has had decreased energy level and she has been very tired for the last 3 days.  She reports when she has been standing up she gets dizzy and today she went to the bathroom and remembers turning on the water in the sink and then remembers waking up on the floor.  She is not sure how long she was on the ground.  Does not remember hitting her head but she states she has no head pain or knots in her head to  suggest she hit her head.  She felt very sweaty.  She has been having shortness of breath if she exerts herself for the last week.  She reports she has had intermittent rectal bleeding for the last 3 to 4 days.  She is on Eliquis but she is not sure why she takes the Eliquis.  She denies having any chest pain or palpitations prior to the syncopal episode.  She has never had a colonoscopy.  She denies any recent fever or illness.  Denies any recent change in her diet.  She has not had any recent travel.   ED Course: She has been hemodynamically stable in the emergency room.  She was found to be profoundly anemic with a hemoglobin of 4.1.  WBC 11,400 hematocrit 15.1 platelets 407,000.  Sodium 135 potassium 3.5 chloride 101 bicarb 18 creatinine 1.34 which is increased from a baseline of 1 BUN 14 calcium 9.3 alk phosphatase 37 AST 41 ALT 27 bilirubin 0.8.  COVID-negative.  Influenza A and B are negative  Hospital Course:  Principal Problem:   Symptomatic anemia Active Problems:   Diabetes mellitus type 2 in obese (HCC)   HTN (hypertension)   Syncope   AKI (acute kidney injury) (HCC)   Prolonged QT interval   Acute blood loss anemia/iron deficiency anemia iron / GI bleed -Hemoglobin was 4 on presentation, baseline 12 -Status post 4 units PRBC transfusion, Hgb 8.3 at discharge  -she also received IV iron in the hospital, and discharged on  oral Iron -Status post EGD and colonoscopy,  + esophagitis and gastritis ,details please refer to original reports -Continue PPI bid x6month then daily -CT abdomen/pel no acute intraabdominal findings -GI cleared her to discharge home and outpatient follow up to consider outpatient capsule , repeat EGD -hold  eliquis for a week, resume if no more bleeding , f/u with pcp and GI   Intermittent rectal prolapse/hemorrhoids General surgery consulted who recommended fiber supplement, adequate hydration, Anusol suppositories, outpatient colorectal surgery follow-up      Non-insulin-dependent type 2 diabetes A1c this hospitalization is not reliable due to acute anemia Will need to follow-up with PCP to repeat A1c once anemia resolves A.m. blood glucose 145 this morning Continue carb modified diet Continue home meds metformin at discharge F/u with pcp   HTN: Discontinue verapamil as this could lead to constipation Restart betablocker, she states she run out this meds She was on ARBs in the past, will start low dose ARB for renal protection in diabetes Patient is advised to check blood pressure at home and bring in record for pcp to review Further bp meds adjustment per pcp/cardiology    PAF on eliquis Currently sinus rhythm Continue betablocker, eliquis held, resume when ok with gi F/u with cardiology Dr TTerri Skains  Hypokalemia/hypomagnesemia Replaced normalized, dyazide discontinued    AKI 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 progressive CKD, now CKD 3 A Creatinine 0.85 in 2020, Creatinine range from 1-1.34 here in the hospital UA did not suggest infection CT scan showed nonobstructive bilateral punctate nephrolithiasis, she denies back pain Renal dosing meds Repeat bmp at hospital discharge follow up   H/o Gout with mild gout flare in the hospital  Report some foot pain, does not have significant edema, no erythema Foot x-ray no acute findings uric acid elevated,  Foot pain resolved with  colchicine  avoid steroids in the setting of gastritis d/c diuretics  start allopurinol at discharge Follow-up with PCP   Speculated 6 mm lung nodule, left lower lobe Repeat noncontrast CT scan in 6 to 168-monthollow-up with PCP   Aortic atherosclerosis Seen on CT lipid panel: LDL 67, HDL 34 F/u with pcp    Body mass index is 32.07 kg/m.. Marland Kitchen Procedures: EGD/colonoscopy   Consultations: GI General surgery  Discharge Exam: BP (!) 156/74 (BP Location: Left Arm)    Pulse 85    Temp 98.9 F (37.2 C) (Oral)    Resp 16    Ht 5' 11"  (1.803 m)    Wt 104.3 kg     SpO2 100%    BMI 32.07 kg/m   General: NAD, pleasant  Cardiovascular: RRR Respiratory: normal respiratory effort   Discharge Instructions You were cared for by a hospitalist during your hospital stay. If you have any questions about your discharge medications or the care you received while you were in the hospital after you are discharged, you can call the unit and asked to speak with the hospitalist on call if the hospitalist that took care of you is not available. Once you are discharged, your primary care physician will handle any further medical issues. Please note that NO REFILLS for any discharge medications will be authorized once you are discharged, as it is imperative that you return to your primary care physician (or establish a relationship with a primary care physician if you do not have one) for your aftercare needs so that they can reassess your need for medications and monitor your lab values.  Discharge Instructions  Diet - low sodium heart healthy   Complete by: As directed    Carb modified diet Please avoid diet that could trigger gout flare   Discharge instructions   Complete by: As directed    Please check your blood pressure at home one to two times a day, bring blood pressure record to your PCP and cardiology, further blood pressure medication adjustment per PCP and cardiology.   Increase activity slowly   Complete by: As directed       Allergies as of 12/07/2021       Reactions   Penicillins Hives   Has patient had a PCN reaction causing immediate rash, facial/tongue/throat swelling, SOB or lightheadedness with hypotension: Yes Has patient had a PCN reaction causing severe rash involving mucus membranes or skin necrosis: Yes Has patient had a PCN reaction that required hospitalization No Has patient had a PCN reaction occurring within the last 10 years: No If all of the above answers are "NO", then may proceed with Cephalosporin use.   Iodinated Contrast  Media Rash   Shrimp [shellfish Allergy] Rash        Medication List     STOP taking these medications    potassium chloride 10 MEQ tablet Commonly known as: KLOR-CON   triamterene-hydrochlorothiazide 37.5-25 MG capsule Commonly known as: DYAZIDE   verapamil 240 MG CR tablet Commonly known as: CALAN-SR       TAKE these medications    Accu-Chek Aviva Plus w/Device Kit USE AS DIRECTED   acetaminophen 500 MG tablet Commonly known as: TYLENOL Take 750 mg by mouth every 6 (six) hours as needed for moderate pain or headache. Reported on 05/31/2016   ALAWAY OP Place 1 drop into both eyes 2 (two) times daily as needed (itching eyes).   allopurinol 100 MG tablet Commonly known as: ZYLOPRIM Take 1 tablet (100 mg total) by mouth daily. Start taking on: December 08, 2021   apixaban 5 MG Tabs tablet Commonly known as: Eliquis Take 1 tablet (5 mg total) by mouth 2 (two) times daily. Please hold this for a week, resume if no more bleeding, please follow up with your pcp and cardiologist closely regarding starting or stopping this meds if you continue to have trouble with bleeding. What changed:  how much to take additional instructions   B-D ASSURE BPM/AUTO WRIST CUFF Misc Use to measure home blood pressure.   colchicine 0.6 MG tablet Take 0.5 tablets (0.3 mg total) by mouth daily for 4 days.   diltiazem 120 MG 24 hr capsule Commonly known as: CARDIZEM CD Take 1 capsule (120 mg total) by mouth at bedtime.   Euthyrox 25 MCG tablet Generic drug: levothyroxine Take 25 mcg by mouth every morning.   ferrous sulfate 325 (65 FE) MG tablet Take 1 tablet (325 mg total) by mouth daily with breakfast. Start taking on: December 08, 3085   folic acid 1 MG tablet Commonly known as: FOLVITE Take 1 tablet (1 mg total) by mouth daily.   gabapentin 300 MG capsule Commonly known as: NEURONTIN Take 300 mg by mouth 3 (three) times daily.   glucose blood test strip Use to test  blood sugar once daily. E11.9   hydrocortisone 25 MG suppository Commonly known as: ANUSOL-HC Place 1 suppository (25 mg total) rectally at bedtime.   hydrocortisone cream 1 % Apply topically 2 (two) times daily.   Lancet Device Misc Use for home glucose monitoring   losartan 25 MG tablet Commonly known as: COZAAR Take 1 tablet (  25 mg total) by mouth daily. Start taking on: December 08, 2021   magnesium oxide 400 (240 Mg) MG tablet Commonly known as: MAG-OX Take 1 tablet (400 mg total) by mouth daily. What changed: when to take this   metFORMIN 500 MG tablet Commonly known as: GLUCOPHAGE Take 1,000 mg by mouth 2 (two) times daily.   metoprolol succinate 100 MG 24 hr tablet Commonly known as: TOPROL-XL Take 1 tablet (100 mg total) by mouth in the morning. Take with or immediately following a meal. Hold if systolic blood pressure (top blood pressure number) less than 100 mmHg or heart rate less than 60 bpm (pulse).   multivitamin with minerals tablet Take 1 tablet by mouth daily. Centrum Silver   pantoprazole 40 MG tablet Commonly known as: PROTONIX Take 1 tablet (40 mg total) by mouth 2 (two) times daily before a meal.   polycarbophil 625 MG tablet Commonly known as: FIBERCON Take 1 tablet (625 mg total) by mouth daily. Start taking on: December 08, 2021   traMADol 50 MG tablet Commonly known as: ULTRAM Take 50 mg by mouth every 6 (six) hours as needed for moderate pain.   vitamin B-12 1000 MCG tablet Commonly known as: CYANOCOBALAMIN Take 1 tablet (1,000 mcg total) by mouth daily.   vitamin C 250 MG tablet Commonly known as: ASCORBIC ACID Take 1 tablet (250 mg total) by mouth daily.       Allergies  Allergen Reactions   Penicillins Hives    Has patient had a PCN reaction causing immediate rash, facial/tongue/throat swelling, SOB or lightheadedness with hypotension: Yes Has patient had a PCN reaction causing severe rash involving mucus membranes or skin  necrosis: Yes Has patient had a PCN reaction that required hospitalization No Has patient had a PCN reaction occurring within the last 10 years: No If all of the above answers are "NO", then may proceed with Cephalosporin use.    Iodinated Contrast Media Rash   Shrimp [Shellfish Allergy] Rash    Follow-up Information     [facility] Follow up.   Specialty: General Surgery Why: We are working to make a follow up appointment for you with one of our colorectal surgeons. Please call to confirm appointment date and time Contact information: [address] [place] [phone]        Bartholome Bill, MD Follow up in 1 week(s).   Specialty: Family Medicine Why: Hospital discharge follow-up, repeat basic labs including CBC/BMP at follow-up pcp to Repeat noncontrast CT scan in 6 to 82-monthto follow up on lung nodule pcp to monitor gout and uric acid level pcp to recheck A1c once anemia resolves Contact information: [phone]         Mansouraty, GTelford Nab, MD Follow up in 1 month(s).   Specialties: Gastroenterology, Internal Medicine Why: Gastritis/esophagitis/GI bleed, outpatient capsule study repeat EGD Contact information: [address] [place] [phone]        TRex Kras DO Follow up in 1 month(s).   Specialties: Cardiology, Vascular Surgery Why: for afib managment Contact information: [address] [place]  [phone]                 The results of significant diagnostics from this hospitalization (including imaging, microbiology, ancillary and laboratory) are listed below for reference.    Significant Diagnostic Studies: CT ABDOMEN PELVIS  WO CONTRAST  Result Date: 12/05/2021 CLINICAL DATA:  Duodenitis Pelvic organ prolapse suspected (Female) Severe microcytic Anemia, heme  positive stool., rule out small bowel lesion that could be an etiology for severe anemia EXAM: CT ABDOMEN AND PELVIS WITHOUT CONTRAST TECHNIQUE: Multidetector CT imaging of the abdomen and pelvis was performed following the standard protocol without IV contrast. COMPARISON:  None. FINDINGS: Lower chest: Partially visualized likely spiculated at least 6 mm left lower lobe pulmonary nodule (5:3). Findings suggestive of anemia. Small hiatal hernia. Hepatobiliary: No focal liver abnormality. Status post cholecystectomy. No biliary dilatation. Pancreas: No focal lesion. Normal pancreatic contour. No surrounding inflammatory changes. No main pancreatic ductal dilatation. Spleen: Normal in size without focal abnormality. Adrenals/Urinary Tract: No adrenal nodule bilaterally. Punctate left nephrolithiasis. Possible punctate right nephrolithiasis. No hydronephrosis. A 3.4 cm fluid density lesion within left kidney likely represents a simple renal cyst. No ureterolithiasis or hydroureter. The urinary bladder is unremarkable. Stomach/Bowel: Fullness of the gastric fundus likely due to under distension (3:20). Stomach is within normal limits. No evidence of bowel wall thickening or dilatation. Appendix appears normal. Rectal prolapse is noted. The Vascular/Lymphatic: No abdominal aorta or iliac aneurysm. Mild atherosclerotic plaque of the aorta and its branches. No abdominal, pelvic, or inguinal lymphadenopathy. Reproductive: Uterus and bilateral adnexa are unremarkable. Bilateral tubal ligation. Other: No intraperitoneal free fluid. No intraperitoneal free gas. No organized fluid collection. Musculoskeletal: No abdominal wall hernia or abnormality. No suspicious lytic or blastic osseous lesions. No acute displaced fracture. Multilevel degenerative changes of the spine. IMPRESSION: 1. Please note markedly limited evaluation on this noncontrast study. 2. Partially visualized likely spiculated at least 6 mm left lower lobe  pulmonary nodule. Non-contrast chest CT at 6-12 months is recommended. If the nodule is stable at time of repeat CT, then future CT at 18-24 months (from today's scan) is considered optional for low-risk patients, but is recommended for high-risk patients. This recommendation follows the consensus statement: Guidelines for Management of Incidental Pulmonary Nodules Detected on CT Images: From the Fleischner Society 2017; Radiology 2017; 284:228-243. 3. Small hiatal hernia. 4. Nonobstructive bilateral punctate nephrolithiasis. 5. Rectal prolapse. 6.  Aortic Atherosclerosis (ICD10-I70.0). Electronically Signed   By: Iven Finn M.D.   On: 12/05/2021 20:17   CT HEAD WO CONTRAST  Result Date: 12/03/2021 CLINICAL DATA:  Head trauma. EXAM: CT HEAD WITHOUT CONTRAST TECHNIQUE: Contiguous axial images were obtained from the base of the skull through the vertex without intravenous contrast. COMPARISON:  CT of the head 03/17/2017 FINDINGS: Brain: No evidence of acute infarction, hemorrhage, hydrocephalus, extra-axial collection or mass lesion/mass effect. There is mild periventricular white matter hypodensity, likely chronic small vessel ischemic change. This has increased compared prior study. Vascular: No hyperdense vessel or unexpected calcification. Skull: Normal. Negative for fracture or focal lesion. Sinuses/Orbits: No acute finding. There is some mucosal thickening of the right frontal sinus. Other: None. IMPRESSION: No acute intracranial abnormality. Electronically Signed   By: Ronney Asters M.D.   On: 12/03/2021 23:15   DG Chest Port 1 View  Result Date: 12/03/2021 CLINICAL DATA:  Syncope. EXAM: PORTABLE CHEST 1 VIEW COMPARISON:  August 31, 2019 FINDINGS: There is no evidence of acute infiltrate, pleural effusion or pneumothorax. The heart size and mediastinal contours are within normal limits. The visualized skeletal structures are unremarkable. IMPRESSION: No active disease. Electronically Signed    By: Virgina Norfolk M.D.   On: 12/03/2021 22:02   DG Foot 2 Views Left  Result Date: 12/04/2021 CLINICAL DATA:  Left foot  pain EXAM: LEFT FOOT - 2 VIEW COMPARISON:  None. FINDINGS: There is no evidence of fracture or dislocation. Diffuse demineralization. There is no evidence of arthropathy or other focal bone abnormality. Soft tissues are unremarkable. IMPRESSION: No acute osseous abnormality. Electronically Signed   By: Yetta Glassman M.D.   On: 12/04/2021 15:38   DG Foot 2 Views Right  Result Date: 12/04/2021 CLINICAL DATA:  Bilateral foot pain. EXAM: RIGHT FOOT - 2 VIEW COMPARISON:  Contralateral foot of the same date. FINDINGS: Osteopenia and degenerative changes. Signs of Achilles and plantar enthesopathy. Mild hallux valgus. No signs of fracture or dislocation. No substantial soft tissue swelling. IMPRESSION: Osteopenia and degenerative changes. No signs of acute fracture or dislocation. Electronically Signed   By: Zetta Bills M.D.   On: 12/04/2021 15:38    Microbiology: Recent Results (from the past 240 hour(s))  Resp Panel by RT-PCR (Flu A&B, Covid) Nasopharyngeal Swab     Status: None   Collection Time: 12/03/21  9:45 PM   Specimen: Nasopharyngeal Swab; Nasopharyngeal(NP) swabs in vial transport medium  Result Value Ref Range Status   SARS Coronavirus 2 by RT PCR NEGATIVE NEGATIVE Final    Comment: (NOTE) SARS-CoV-2 target nucleic acids are NOT DETECTED.  The SARS-CoV-2 RNA is generally detectable in upper respiratory specimens during the acute phase of infection. The lowest concentration of SARS-CoV-2 viral copies this assay can detect is 138 copies/mL. A negative result does not preclude SARS-Cov-2 infection and should not be used as the sole basis for treatment or other patient management decisions. A negative result may occur with  improper specimen collection/handling, submission of specimen other than nasopharyngeal swab, presence of viral mutation(s) within  the areas targeted by this assay, and inadequate number of viral copies(<138 copies/mL). A negative result must be combined with clinical observations, patient history, and epidemiological information. The expected result is Negative.  Fact Sheet for Patients:  EntrepreneurPulse.com.au  Fact Sheet for Healthcare Providers:  IncredibleEmployment.be  This test is no t yet approved or cleared by the [place] FDA and  has been authorized for detection and/or diagnosis of SARS-CoV-2 by FDA under an Emergency Use Authorization (EUA). This EUA will remain  in effect (meaning this test can be used) for the duration of the COVID-19 declaration under Section 564(b)(1) of the Act, 21 U.S.C.section 360bbb-3(b)(1), unless the authorization is terminated  or revoked sooner.       Influenza A by PCR NEGATIVE NEGATIVE Final   Influenza B by PCR NEGATIVE NEGATIVE Final    Comment: (NOTE) The Xpert Xpress SARS-CoV-2/FLU/RSV plus assay is intended as an aid in the diagnosis of influenza from Nasopharyngeal swab specimens and should not be used as a sole basis for treatment. Nasal washings and aspirates are unacceptable for Xpert Xpress SARS-CoV-2/FLU/RSV testing.  Fact Sheet for Patients: EntrepreneurPulse.com.au  Fact Sheet for Healthcare Providers: IncredibleEmployment.be  This test is not yet approved or cleared by the [place] FDA and has been authorized for detection and/or diagnosis of SARS-CoV-2 by FDA under an Emergency Use Authorization (EUA). This EUA will remain in effect (meaning this test can be used) for the duration of the COVID-19 declaration under Section 564(b)(1) of the Act, 21 U.S.C. section 360bbb-3(b)(1), unless the authorization is terminated or revoked.  Performed at [facility], [place] [address] [place], [place] [address]      Labs: Basic Metabolic Panel: Recent Labs  Lab  12/03/21 2225 12/04/21 0044 12/05/21 0454 12/06/21 0137 12/07/21 0618  NA 135  133* 136 134* 136  K 3.5 3.8 3.3* 3.5 3.5  CL 101 102 104 104 104  CO2 18* 19* 18* 20* 22  GLUCOSE 246* 228* 194* 145* 160*  BUN 14 14 9  7* 8  CREATININE 1.34* 1.29* 1.20* 1.00 1.25*  CALCIUM 9.3 8.8* 9.2 9.0 9.0  MG  --   --  2.0 1.9 1.9   Liver Function Tests: Recent Labs  Lab 12/03/21 2225  AST 41  ALT 27  ALKPHOS 37*  BILITOT 0.8  PROT 7.4  ALBUMIN 3.7   No results for input(s): LIPASE, AMYLASE in the last 168 hours. No results for input(s): AMMONIA in the last 168 hours. CBC: Recent Labs  Lab 12/03/21 2330 12/04/21 0044 12/04/21 1701 12/05/21 0454 12/06/21 0137 12/07/21 0618  WBC 11.4* 10.1 9.2 11.9* 11.6* 7.2  NEUTROABS 9.0*  --   --   --  7.4  --   HGB 4.1* 4.9* 7.8* 8.6* 8.0* 8.3*  HCT 15.1* 17.9* 23.5* 26.0* 24.9* 25.4*  MCV 70.6* 69.4* 75.3* 75.8* 76.4* 76.5*  PLT 407* 357 327 360 338 319   Cardiac Enzymes: No results for input(s): CKTOTAL, CKMB, CKMBINDEX, TROPONINI in the last 168 hours. BNP: BNP (last 3 results) No results for input(s): BNP in the last 8760 hours.  ProBNP (last 3 results) No results for input(s): PROBNP in the last 8760 hours.  CBG: Recent Labs  Lab 12/06/21 0751 12/06/21 1335 12/06/21 1628 12/06/21 2031 12/07/21 0759  GLUCAP 148* 214* 128* 204* 143*       Signed:  Florencia Reasons MD, PhD, FACP  [facility] 12/07/2021, 11:55 AM

## 2021-12-07 NOTE — Plan of Care (Signed)
  Problem: Education: Goal: Knowledge of General Education information will improve Description: Including pain rating scale, medication(s)/side effects and non-pharmacologic comfort measures Outcome: Progressing   Problem: Pain Managment: Goal: General experience of comfort will improve Outcome: Progressing   Problem: Safety: Goal: Ability to remain free from injury will improve Outcome: Progressing   

## 2021-12-07 NOTE — Plan of Care (Signed)
Problem: Education: Goal: Knowledge of General Education information will improve Description: Including pain rating scale, medication(s)/side effects and non-pharmacologic comfort measures 12/07/2021 1318 by Les Pou, RN Outcome: Completed/Met 12/07/2021 1101 by Les Pou, RN Outcome: Progressing   Problem: Health Behavior/Discharge Planning: Goal: Ability to manage health-related needs will improve 12/07/2021 1318 by Les Pou, RN Outcome: Completed/Met 12/07/2021 1101 by Les Pou, RN Outcome: Progressing   Problem: Clinical Measurements: Goal: Ability to maintain clinical measurements within normal limits will improve 12/07/2021 1318 by Les Pou, RN Outcome: Completed/Met 12/07/2021 1101 by Les Pou, RN Outcome: Progressing Goal: Will remain free from infection 12/07/2021 1318 by Les Pou, RN Outcome: Completed/Met 12/07/2021 1101 by Les Pou, RN Outcome: Progressing Goal: Diagnostic test results will improve 12/07/2021 1318 by Les Pou, RN Outcome: Completed/Met 12/07/2021 1101 by Les Pou, RN Outcome: Progressing Goal: Respiratory complications will improve 12/07/2021 1318 by Les Pou, RN Outcome: Completed/Met 12/07/2021 1101 by Les Pou, RN Outcome: Progressing Goal: Cardiovascular complication will be avoided 12/07/2021 1318 by Les Pou, RN Outcome: Completed/Met 12/07/2021 1101 by Les Pou, RN Outcome: Progressing   Problem: Activity: Goal: Risk for activity intolerance will decrease 12/07/2021 1318 by Les Pou, RN Outcome: Completed/Met 12/07/2021 1101 by Les Pou, RN Outcome: Progressing   Problem: Nutrition: Goal: Adequate nutrition will be maintained 12/07/2021 1318 by Les Pou, RN Outcome: Completed/Met 12/07/2021 1101 by Les Pou, RN Outcome: Progressing   Problem: Coping: Goal: Level of anxiety will  decrease 12/07/2021 1318 by Les Pou, RN Outcome: Completed/Met 12/07/2021 1101 by Les Pou, RN Outcome: Progressing   Problem: Elimination: Goal: Will not experience complications related to bowel motility 12/07/2021 1318 by Les Pou, RN Outcome: Completed/Met 12/07/2021 1101 by Les Pou, RN Outcome: Progressing Goal: Will not experience complications related to urinary retention 12/07/2021 1318 by Les Pou, RN Outcome: Completed/Met 12/07/2021 1101 by Les Pou, RN Outcome: Progressing   Problem: Pain Managment: Goal: General experience of comfort will improve 12/07/2021 1318 by Les Pou, RN Outcome: Completed/Met 12/07/2021 1101 by Les Pou, RN Outcome: Progressing   Problem: Safety: Goal: Ability to remain free from injury will improve 12/07/2021 1318 by Les Pou, RN Outcome: Completed/Met 12/07/2021 1101 by Les Pou, RN Outcome: Progressing   Problem: Skin Integrity: Goal: Risk for impaired skin integrity will decrease 12/07/2021 1318 by Les Pou, RN Outcome: Completed/Met 12/07/2021 1101 by Les Pou, RN Outcome: Progressing

## 2021-12-07 NOTE — Progress Notes (Signed)
Discharge instructions reviewed with pt, including home medications, follow-up appointments, and safety in regards to syncopal episodes. PIV removed with catheter intact. Pt states she has no further questions at this time. Pt is being discharged to home and she will be transported by her son via private vehicle.

## 2021-12-09 ENCOUNTER — Encounter (HOSPITAL_COMMUNITY): Payer: Self-pay | Admitting: Gastroenterology

## 2021-12-09 NOTE — Anesthesia Postprocedure Evaluation (Signed)
Anesthesia Post Note  Patient: Frances Cabrera  Procedure(s) Performed: COLONOSCOPY WITH PROPOFOL ESOPHAGOGASTRODUODENOSCOPY (EGD) WITH PROPOFOL BIOPSY     Patient location during evaluation: PACU Anesthesia Type: MAC Level of consciousness: awake and alert Pain management: pain level controlled Vital Signs Assessment: post-procedure vital signs reviewed and stable Respiratory status: spontaneous breathing Cardiovascular status: stable Anesthetic complications: no   No notable events documented.  Last Vitals:  Vitals:   12/07/21 0757 12/07/21 1210  BP: (!) 156/74 (!) 134/52  Pulse: 85 76  Resp: 16 16  Temp: 37.2 C 36.5 C  SpO2: 100% 98%    Last Pain:  Vitals:   12/07/21 1210  TempSrc: Oral  PainSc:                  Nolon Nations

## 2021-12-10 ENCOUNTER — Telehealth: Payer: Self-pay

## 2021-12-10 ENCOUNTER — Other Ambulatory Visit: Payer: Self-pay

## 2021-12-10 ENCOUNTER — Encounter: Payer: Self-pay | Admitting: Gastroenterology

## 2021-12-10 DIAGNOSIS — D509 Iron deficiency anemia, unspecified: Secondary | ICD-10-CM

## 2021-12-10 LAB — SURGICAL PATHOLOGY

## 2021-12-10 NOTE — Telephone Encounter (Signed)
-----   Message from Imogene Burn, MD sent at 12/06/2021  5:28 PM EST ----- Hi, could we arrange for clinic follow up with Gabe or APP in 1 month?  Thanks Alan Ripper

## 2021-12-10 NOTE — Telephone Encounter (Signed)
Per Dr.'s Leonides Schanz and Mansouraty request, pt has been scheduled for 1 mo hosp f/u; Gastritis/esophagitis/GI bleed, outpatient capsule study  repeat EGD on 01/10/22 @ 310pm. Appt reminder has been mailed. Appt will reflect on AVS upon hosp discharge for pt future reference.

## 2021-12-11 NOTE — Telephone Encounter (Signed)
SECOND ATTEMPT: ° °LVM requesting returned call. °

## 2021-12-12 NOTE — Telephone Encounter (Signed)
FINAL ATTEMPT:  LVM requesting returned call. Appt reminder has already been mailed to pt home address.

## 2022-01-10 ENCOUNTER — Ambulatory Visit: Payer: Medicare HMO | Admitting: Gastroenterology

## 2022-07-04 ENCOUNTER — Encounter: Payer: Self-pay | Admitting: Gastroenterology
